# Patient Record
Sex: Male | Born: 1950 | Race: White | Hispanic: No | Marital: Married | Smoking: Smoker, current status unknown
Health system: Southern US, Community
[De-identification: ages and names within clinical notes are randomized; demographics above are authoritative.]

## PROBLEM LIST (undated history)

## (undated) DIAGNOSIS — R0602 Shortness of breath: Secondary | ICD-10-CM

## (undated) DIAGNOSIS — Z720 Tobacco use: Secondary | ICD-10-CM

## (undated) DIAGNOSIS — R55 Syncope and collapse: Secondary | ICD-10-CM

## (undated) DIAGNOSIS — J449 Chronic obstructive pulmonary disease, unspecified: Secondary | ICD-10-CM

## (undated) DIAGNOSIS — I10 Essential (primary) hypertension: Secondary | ICD-10-CM

## (undated) DIAGNOSIS — G5790 Unspecified mononeuropathy of unspecified lower limb: Secondary | ICD-10-CM

## (undated) DIAGNOSIS — E785 Hyperlipidemia, unspecified: Secondary | ICD-10-CM

## (undated) DIAGNOSIS — C801 Malignant (primary) neoplasm, unspecified: Secondary | ICD-10-CM

## (undated) HISTORY — PX: SPINE SURGERY: SHX786

## (undated) HISTORY — DX: Syncope and collapse: R55

## (undated) HISTORY — PX: EYE SURGERY: SHX253

## (undated) HISTORY — PX: NECK SURGERY: SHX720

## (undated) HISTORY — PX: CHOLECYSTECTOMY: SHX55

## (undated) HISTORY — PX: SHOULDER SURGERY: SHX246

## (undated) HISTORY — PX: KNEE CARTILAGE SURGERY: SHX688

---

## 1998-09-27 ENCOUNTER — Observation Stay (HOSPITAL_COMMUNITY): Admission: RE | Admit: 1998-09-27 | Discharge: 1998-09-28 | Payer: Self-pay | Admitting: Specialist

## 1998-09-27 ENCOUNTER — Encounter: Payer: Self-pay | Admitting: Specialist

## 2001-01-18 ENCOUNTER — Encounter (INDEPENDENT_AMBULATORY_CARE_PROVIDER_SITE_OTHER): Payer: Self-pay

## 2001-01-18 ENCOUNTER — Ambulatory Visit (HOSPITAL_COMMUNITY): Admission: RE | Admit: 2001-01-18 | Discharge: 2001-01-18 | Payer: Self-pay | Admitting: Gastroenterology

## 2001-09-20 ENCOUNTER — Emergency Department (HOSPITAL_COMMUNITY): Admission: EM | Admit: 2001-09-20 | Discharge: 2001-09-20 | Payer: Self-pay | Admitting: Emergency Medicine

## 2001-09-29 ENCOUNTER — Inpatient Hospital Stay (HOSPITAL_COMMUNITY): Admission: EM | Admit: 2001-09-29 | Discharge: 2001-10-04 | Payer: Self-pay | Admitting: Emergency Medicine

## 2001-10-03 ENCOUNTER — Encounter: Payer: Self-pay | Admitting: *Deleted

## 2005-07-28 ENCOUNTER — Emergency Department (HOSPITAL_COMMUNITY): Admission: EM | Admit: 2005-07-28 | Discharge: 2005-07-28 | Payer: Self-pay | Admitting: Emergency Medicine

## 2007-01-30 ENCOUNTER — Ambulatory Visit: Payer: Self-pay

## 2008-05-10 ENCOUNTER — Emergency Department (HOSPITAL_COMMUNITY): Admission: EM | Admit: 2008-05-10 | Discharge: 2008-05-10 | Payer: Self-pay | Admitting: Emergency Medicine

## 2008-11-05 ENCOUNTER — Encounter: Payer: Self-pay | Admitting: Pulmonary Disease

## 2009-05-07 ENCOUNTER — Encounter: Payer: Self-pay | Admitting: Pulmonary Disease

## 2009-09-12 DIAGNOSIS — J4489 Other specified chronic obstructive pulmonary disease: Secondary | ICD-10-CM | POA: Insufficient documentation

## 2009-09-12 DIAGNOSIS — F17200 Nicotine dependence, unspecified, uncomplicated: Secondary | ICD-10-CM | POA: Insufficient documentation

## 2009-09-12 DIAGNOSIS — J449 Chronic obstructive pulmonary disease, unspecified: Secondary | ICD-10-CM | POA: Insufficient documentation

## 2009-09-12 DIAGNOSIS — G8929 Other chronic pain: Secondary | ICD-10-CM | POA: Insufficient documentation

## 2009-09-15 ENCOUNTER — Ambulatory Visit: Payer: Self-pay | Admitting: Pulmonary Disease

## 2009-09-15 DIAGNOSIS — J984 Other disorders of lung: Secondary | ICD-10-CM

## 2009-09-15 DIAGNOSIS — L4050 Arthropathic psoriasis, unspecified: Secondary | ICD-10-CM | POA: Insufficient documentation

## 2009-09-18 ENCOUNTER — Ambulatory Visit: Payer: Self-pay | Admitting: Internal Medicine

## 2009-09-25 ENCOUNTER — Ambulatory Visit: Payer: Self-pay | Admitting: Pulmonary Disease

## 2009-10-04 ENCOUNTER — Encounter: Payer: Self-pay | Admitting: Pulmonary Disease

## 2009-10-07 ENCOUNTER — Ambulatory Visit: Payer: Self-pay | Admitting: Pulmonary Disease

## 2010-06-08 ENCOUNTER — Observation Stay (HOSPITAL_COMMUNITY): Admission: EM | Admit: 2010-06-08 | Discharge: 2010-06-09 | Payer: Self-pay | Admitting: Emergency Medicine

## 2010-06-09 ENCOUNTER — Encounter (INDEPENDENT_AMBULATORY_CARE_PROVIDER_SITE_OTHER): Payer: Self-pay | Admitting: Internal Medicine

## 2010-06-09 ENCOUNTER — Ambulatory Visit: Payer: Self-pay | Admitting: Vascular Surgery

## 2010-11-20 ENCOUNTER — Encounter (INDEPENDENT_AMBULATORY_CARE_PROVIDER_SITE_OTHER): Payer: Self-pay | Admitting: Internal Medicine

## 2010-11-20 ENCOUNTER — Ambulatory Visit: Payer: Self-pay | Admitting: Cardiology

## 2010-11-20 ENCOUNTER — Observation Stay (HOSPITAL_COMMUNITY): Admission: EM | Admit: 2010-11-20 | Discharge: 2010-11-20 | Payer: Self-pay | Admitting: Emergency Medicine

## 2011-01-24 LAB — CONVERTED CEMR LAB
ALT: 35 units/L (ref 0–53)
Albumin: 4 g/dL (ref 3.5–5.2)
Angiotensin 1 Converting Enzyme: 48 units/L (ref 9–67)
BUN: 11 mg/dL (ref 6–23)
CRP, High Sensitivity: 9.8 — ABNORMAL HIGH (ref 0.00–5.00)
Calcium: 9.1 mg/dL (ref 8.4–10.5)
Chloride: 104 meq/L (ref 96–112)
Eosinophils Absolute: 0.4 10*3/uL (ref 0.0–0.7)
Eosinophils Relative: 4.7 % (ref 0.0–5.0)
GFR calc non Af Amer: 72.78 mL/min (ref >60)
Glucose, Bld: 123 mg/dL — ABNORMAL HIGH (ref 70–99)
Lymphocytes Relative: 24.5 % (ref 12.0–46.0)
Lymphs Abs: 2.2 10*3/uL (ref 0.7–4.0)
Monocytes Absolute: 0.8 10*3/uL (ref 0.1–1.0)
Neutro Abs: 5.5 10*3/uL (ref 1.4–7.7)
Neutrophils Relative %: 60.8 % (ref 43.0–77.0)
Platelets: 254 10*3/uL (ref 150.0–400.0)
Potassium: 4.2 meq/L (ref 3.5–5.1)
RDW: 11.5 % (ref 11.5–14.6)
Sed Rate: 14 mm/hr (ref 0–22)
Total Protein: 7.4 g/dL (ref 6.0–8.3)

## 2011-03-09 LAB — COMPREHENSIVE METABOLIC PANEL
ALT: 24 U/L (ref 0–53)
AST: 39 U/L — ABNORMAL HIGH (ref 0–37)
Albumin: 3.2 g/dL — ABNORMAL LOW (ref 3.5–5.2)
Alkaline Phosphatase: 64 U/L (ref 39–117)
CO2: 28 mEq/L (ref 19–32)
Calcium: 8.2 mg/dL — ABNORMAL LOW (ref 8.4–10.5)
Calcium: 8.6 mg/dL (ref 8.4–10.5)
Chloride: 108 mEq/L (ref 96–112)
Creatinine, Ser: 1.45 mg/dL (ref 0.4–1.5)
GFR calc Af Amer: >60 mL/min (ref >60)
Glucose, Bld: 100 mg/dL — ABNORMAL HIGH (ref 70–99)
Glucose, Bld: 86 mg/dL (ref 70–99)
Potassium: 3.7 mEq/L (ref 3.5–5.1)
Potassium: 4 mEq/L (ref 3.5–5.1)
Sodium: 139 mEq/L (ref 135–145)
Sodium: 143 mEq/L (ref 135–145)
Total Bilirubin: 0.5 mg/dL (ref 0.3–1.2)
Total Protein: 6.1 g/dL (ref 6.0–8.3)

## 2011-03-09 LAB — URINALYSIS, ROUTINE W REFLEX MICROSCOPIC
Glucose, UA: NEGATIVE mg/dL
Hgb urine dipstick: NEGATIVE
Nitrite: NEGATIVE
Protein, ur: NEGATIVE mg/dL
Specific Gravity, Urine: 1.027 (ref 1.005–1.030)
Urobilinogen, UA: 1 mg/dL (ref 0.0–1.0)

## 2011-03-09 LAB — CBC
HCT: 42.5 % (ref 39.0–52.0)
HCT: 45.7 % (ref 39.0–52.0)
Hemoglobin: 14.4 g/dL (ref 13.0–17.0)
MCH: 31.3 pg (ref 26.0–34.0)
MCH: 32.1 pg (ref 26.0–34.0)
MCV: 91.6 fL (ref 78.0–100.0)
MCV: 92.4 fL (ref 78.0–100.0)
Platelets: 166 10*3/uL (ref 150–400)
Platelets: 214 10*3/uL (ref 150–400)
RBC: 4.6 MIL/uL (ref 4.22–5.81)
RDW: 12.2 % (ref 11.5–15.5)
WBC: 10.2 10*3/uL (ref 4.0–10.5)
WBC: 6.4 10*3/uL (ref 4.0–10.5)

## 2011-03-09 LAB — POCT CARDIAC MARKERS
CKMB, poc: <1 ng/mL — ABNORMAL LOW (ref 1.0–8.0)
Myoglobin, poc: 21.8 ng/mL (ref 12–200)
Myoglobin, poc: 25 ng/mL (ref 12–200)

## 2011-03-09 LAB — DIFFERENTIAL
Basophils Absolute: 0.1 10*3/uL (ref 0.0–0.1)
Eosinophils Relative: 5 % (ref 0–5)
Lymphocytes Relative: 27 % (ref 12–46)

## 2011-03-09 LAB — LIPID PANEL
HDL: 35 mg/dL — ABNORMAL LOW (ref >39)
LDL Cholesterol: 121 mg/dL — ABNORMAL HIGH (ref 0–99)
Triglycerides: 194 mg/dL — ABNORMAL HIGH (ref <150)

## 2011-03-09 LAB — CK TOTAL AND CKMB (NOT AT ARMC)
CK, MB: 1 ng/mL (ref 0.3–4.0)
Total CK: 27 U/L (ref 7–232)
Total CK: 28 U/L (ref 7–232)

## 2011-03-09 LAB — TROPONIN I
Troponin I: 0.02 ng/mL (ref 0.00–0.06)
Troponin I: 0.03 ng/mL (ref 0.00–0.06)

## 2011-03-09 LAB — URINE CULTURE: Colony Count: 3000

## 2011-03-15 LAB — COMPREHENSIVE METABOLIC PANEL
ALT: 24 U/L (ref 0–53)
AST: 21 U/L (ref 0–37)
Alkaline Phosphatase: 67 U/L (ref 39–117)
CO2: 25 mEq/L (ref 19–32)
Calcium: 7.9 mg/dL — ABNORMAL LOW (ref 8.4–10.5)
GFR calc Af Amer: >60 mL/min (ref >60)
GFR calc non Af Amer: >60 mL/min (ref >60)
Glucose, Bld: 180 mg/dL — ABNORMAL HIGH (ref 70–99)
Potassium: 3.6 mEq/L (ref 3.5–5.1)
Sodium: 138 mEq/L (ref 135–145)

## 2011-03-15 LAB — URINALYSIS, ROUTINE W REFLEX MICROSCOPIC
Bilirubin Urine: NEGATIVE
Hgb urine dipstick: NEGATIVE
Nitrite: NEGATIVE
Specific Gravity, Urine: 1.013 (ref 1.005–1.030)
Urobilinogen, UA: 1 mg/dL (ref 0.0–1.0)
pH: 6.5 (ref 5.0–8.0)

## 2011-03-15 LAB — URINE CULTURE: Colony Count: 15000

## 2011-03-15 LAB — HEMOGLOBIN A1C
Hgb A1c MFr Bld: 5.8 % — ABNORMAL HIGH (ref <5.7)
Mean Plasma Glucose: 120 mg/dL — ABNORMAL HIGH (ref <117)

## 2011-03-15 LAB — POCT I-STAT, CHEM 8
Calcium, Ion: 1.07 mmol/L — ABNORMAL LOW (ref 1.12–1.32)
Chloride: 102 mEq/L (ref 96–112)
Creatinine, Ser: 1.5 mg/dL (ref 0.4–1.5)
Glucose, Bld: 134 mg/dL — ABNORMAL HIGH (ref 70–99)
Potassium: 4 mEq/L (ref 3.5–5.1)

## 2011-03-15 LAB — CBC
Hemoglobin: 15.8 g/dL (ref 13.0–17.0)
RBC: 4.89 MIL/uL (ref 4.22–5.81)

## 2011-03-15 LAB — CULTURE, BLOOD (ROUTINE X 2): Culture: NO GROWTH

## 2011-03-15 LAB — CK TOTAL AND CKMB (NOT AT ARMC): CK, MB: 1 ng/mL (ref 0.3–4.0)

## 2011-03-15 LAB — DIFFERENTIAL
Basophils Absolute: 0 10*3/uL (ref 0.0–0.1)
Basophils Relative: 0 % (ref 0–1)
Lymphocytes Relative: 18 % (ref 12–46)
Monocytes Absolute: 0.9 10*3/uL (ref 0.1–1.0)
Monocytes Relative: 8 % (ref 3–12)
Neutro Abs: 7.3 10*3/uL (ref 1.7–7.7)
Neutrophils Relative %: 72 % (ref 43–77)

## 2011-03-15 LAB — POCT CARDIAC MARKERS
CKMB, poc: <1 ng/mL — ABNORMAL LOW (ref 1.0–8.0)
Myoglobin, poc: 75.4 ng/mL (ref 12–200)
Troponin i, poc: <0.05 ng/mL (ref 0.00–0.09)

## 2011-03-15 LAB — CARDIAC PANEL(CRET KIN+CKTOT+MB+TROPI)
CK, MB: 1.1 ng/mL (ref 0.3–4.0)
CK, MB: 1.1 ng/mL (ref 0.3–4.0)
Relative Index: INVALID (ref 0.0–2.5)
Troponin I: 0.01 ng/mL (ref 0.00–0.06)

## 2011-03-15 LAB — CORTISOL: Cortisol, Plasma: 1.8 ug/dL

## 2011-03-15 LAB — TYPE AND SCREEN: Antibody Screen: NEGATIVE

## 2011-03-15 LAB — MRSA PCR SCREENING: MRSA by PCR: NEGATIVE

## 2011-03-15 LAB — PROLACTIN: Prolactin: 3.4 ng/mL (ref 2.1–17.1)

## 2011-03-15 LAB — LIPID PANEL
HDL: 23 mg/dL — ABNORMAL LOW (ref >39)
LDL Cholesterol: 59 mg/dL (ref 0–99)
Triglycerides: 334 mg/dL — ABNORMAL HIGH (ref <150)
VLDL: 67 mg/dL — ABNORMAL HIGH (ref 0–40)

## 2011-03-15 LAB — TROPONIN I: Troponin I: 0.01 ng/mL (ref 0.00–0.06)

## 2011-03-15 LAB — D-DIMER, QUANTITATIVE: D-Dimer, Quant: 0.26 ug/mL-FEU (ref 0.00–0.48)

## 2011-05-14 NOTE — Consult Note (Signed)
[place]. [facility]  Patient:    Nicholas Soto, Nicholas Soto Visit Number: [id number] MRN: [id number]          Service Type: MED Location: [phone] Attending Physician:  Nelta Numbers Proc. Date: 10/03/01 Admit Date:  09/29/2001                            Consultation Report  CHIEF COMPLAINT:  Recurrent episodes of lightheadedness and syncope x 1.  HISTORY OF PRESENT ILLNESS:  Patient is a 60 year old white man with a 30-month history of substernal nonradiating episodes of chest pain that have been associated with slight dyspnea.  These episodes have lasted seconds to minutes and then spontaneously resolved, are not exacerbated by activity.  At the same time the patient has had a recent history of light-headed spells that have increased in severity over the past two weeks but have been ongoing for an indeterminate amount of time.  These episodes are not associated with his chest pain; however, he is hospitalized for his chest pain.  His light-headed spells are associated with bilateral lower extremity weakness and he describes these spells as just a feeling of dizziness that can last anywhere from seconds to minutes.  They are not position dependent and are associated with a sense of vertigo and are not associated with nausea or vomiting - he just feels like he is "unable to think straight."  Approximately two weeks ago, the patient had an episode at night when he was bent over trying to unplug an appliance in his bedroom.  When he stood back up he felt very unsteady and took several backward steps and felt that he would have fallen if he had not backed into his dresser.  The patient had a few beers that night and really did not think anything more about that episode and went to sleep.  And then approximately six days prior to admission the patient had an episode while he was playing in the floor with his granddaughter in which, when he  saturation up he had a syncopal episode, became unresponsive for a few minutes, and slowly became more responsive, was able to answer his wife; when she asked him if something was wrong, he said "yes, something is wrong."  He does not remember that episode until EMS arrived, which was approximately 10 to 15 minutes after that episode began.  This has been his only syncopal episode in his life.  He came to the ER after that and was discharged after his EKG was normal, and then two days after that - after eating a heavy [place] meal - had some severe chest pain along with lightheadedness and came to the hospital and has been admitted since then, which has been five days.  The patient has had an extensive cardiac workup which to date is within normal limits, and we were asked to see the patient for neurology consult.  PAST MEDICAL HISTORY:  Significant for hyperlipidemia and peptic ulcer disease/GERD.  MEDICATIONS:  His medication prior to admission was  Prilosec 20 p.o. q.d. Since then, he has been started on Zocor.  ALLERGIES:  DEMEROL.  PAST SURGICAL HISTORY: 1. Lower lumbar surgery. 2. Bilateral shoulder surgeries. 3. Cervical fusion between C4 and C7 in approximately 2000.  SOCIAL HISTORY:  The patient lives in [place] with his wife and two sons, who are 52 and 63, and a six-year-old adopted daughter.  He has a positive tobacco  history of one-and-a-half packs per day x 40 years.  He is currently attempting to stop smoking and has been started on Wellbutrin.  He has a positive alcohol history of two to three beers per day.  Denies any current use of IV drugs or marijuana but does have a remote history of opioid and hash use but says that he has completely stopped that.  He works in a [employer], [employer].  FAMILY HISTORY:  Significant for his father had epilepsy and died of a heart attack, and his mother is still alive but has severe diabetes, has had an amputation, and has  lost function of her kidneys.  She also has a history of colon cancer and kidney cancer.  He has a brother with multiple sclerosis and one brother who is deceased from complications of diabetes.  LABORATORY DATA:  Significant for cholesterol 214, triglycerides 488, HDL cholesterol 41.  PTT 38.  Sodium 140, potassium 4.1, chloride 100, bicarb 30, BUN 12, creatinine 1.2, glucose 87, calcium 9.2.  REVIEW OF SYSTEMS:  Negative except as noted above in the HPI.  He does describe some muscle spasms that come and go in his arms, back, and legs.  He is able to look and see and can even induce these himself.  CARDIOVASCULAR: Besides the chest pain he has been having, he denies any palpitations. ENDOCRINE:  He denies any weight loss and does not have heat or cold intolerance.  PULMONARY:  He denies any shortness of breath or wheezing.  GI: He denies any hematemesis or hematochezia.  No bright red blood per rectum. JOINT:  He has no swelling of joints, no pain, and no lower extremity edema.  PHYSICAL EXAMINATION:  VITAL SIGNS:  On our exam, temperature 96.7, pulse 55, respirations 20, blood pressure 110/60.  Saturation 96% on room air.  NEUROLOGIC:  Mental status:  He was alert and oriented x 4.  He knew that it was 2002, that he was at [facility] in [place], that Danae Orleans was Economist, that Remer Macho was [employer].  He did not have any agnosia or apraxia, was able to correctly identify a pen and a watch.  During our exam, he had two light-headed episodes during which he would stop answering and really focus on his dizziness.  He was able to answer questions, however, when prompted during these episodes.  His cranial nerves 2-12 were intact.  He had no facial droop. His extraocular movements were intact.  His pupils were equal, round, and reactive to light and accommodation.  He did not have any nystagmus, no visual field defects.  His tongue was midline upon protrusion.  His palate  raised symmetrically and his shoulder shrug was intact.  Motor exam:  He had normal bulk without atrophy.  His tone was good without fasciculations or spasm.  His  strength was 5/5 throughout with biceps, triceps, flexor carpi, radialis, extensor carpi, ulnaris, hip flexors, gastrocnemius, anterior tibialis.  His sensory exam was intact to fine touch throughout.  His proprioception was intact and his graphesthesia was intact.  Coordination:  Finger-to-nose, he had a little bit of post pointing on exam without his glasses.  He did not have any pronator drift.  Gait:  He had normal stance and was able to tandem walk without problem.  His Romberg was negative.  Reflex exam:  2+ throughout, downgoing Babinskis bilaterally.  His right biceps reflex was absent.  HEENT:  He did not have any carotid bruits, no thyromegaly, and his trachea  was midline.  CARDIOVASCULAR:  He had regular rate and rhythm without murmur, gallop, or rub.  PULMONARY:  He was clear to auscultation bilaterally.  ABDOMEN:  Soft, nontender, positive bowel sounds.  EXTREMITIES:  No edema and his strength and sensation exam was as above.  ASSESSMENT AND PLAN:  This is a 60 year old white man with recurrent lightheadedness and one syncopal episode unrelated to his chest pain, position, or activity.  Differential diagnosis for his episodes is very broad, including most likely, vertebrobasilar insufficiency or VBI - which could be secondary to his cervical fusion surgery with ______ impedance of his vertebral arteries.  This could also be absence seizures or MS, and so we would recommend and plan to obtain an MRI/MRA and an EEG to evaluate this patient in the light of his normal cardiac workup.  We plan to get the EEG during this admission and attempt to get the MRI/MRA.  However, the patient is eager for discharge and may be discharged prior to his MRI/MRA, which he will then receive as an outpatient. Attending  Physician:  Nelta Numbers DD:  10/03/01 TD:  10/03/01 Job: [phone] [id number]

## 2011-05-14 NOTE — Discharge Summary (Signed)
[place]. [facility]  Patient:    Nicholas Soto, Nicholas Soto Visit Number: [id number] MRN: [id number]          Service Type: MED Location: [phone] Attending Physician:  Nelta Numbers Dictated by:   Chinita Pester, N.P. Admit Date:  09/29/2001 Discharge Date: 10/04/2001                             Discharge Summary  ADDENDUM TO DISCHARGE INSTRUCTIONS:  The patient was advised that he was not to drive for the next three months. Dictated by:   Chinita Pester, N.P. Attending Physician:  Nelta Numbers DD:  10/04/01 TD:  10/04/01 Job: [id number]

## 2011-05-14 NOTE — Cardiovascular Report (Signed)
[place]. [facility]  Patient:    Nicholas, Soto Visit Number: [id number] MRN: [id number]          Service Type: MED Location: [phone] Attending Physician:  Nelta Numbers Dictated by:   Veneda Melter, M.D. Proc. Date: 10/02/01 Admit Date:  09/29/2001   CC:         Nathen May, M.D., [facility]  Thayer Ohm ___________, M.D.   Cardiac Catheterization  PROCEDURES PERFORMED: 1. Left heart catheterization. 2. Left ventriculogram. 3. Left coronary angiography.  DIAGNOSES: 1. Mild coronary artery disease by angiogram. 2. Normal left ventricular systolic function.  INDICATIONS:  Mr. Swigart is a 60 year old white male who presents with chest discomfort and syncope.  The patient was admitted to the hospital and ruled out for acute myocardial infarction.  He presents now for further cardiac assessment.  TECHNIQUE:  Informed consent was obtained, the patient was brought to the cardiac catheterization lab.  A 6 French sheath was placed in the right femoral artery.  Left heart catheterization, selective angiography were then performed in the usual fashion using preformed 6 French Judkins catheters.  At the termination of the case, the catheters and sheath were removed.  Manual pressure was applied until adequate hemostasis was achieved.  The patient tolerated the procedure well and was transferred to the floor in stable condition.  FINDINGS:  Findings are as follows:  Left main trunk:  The left main trunk is medium caliber vessel and is angiographically normal.  Left anterior descending:  The left anterior descending is a medium caliber vessel which provides two small diagonal branches.  There is mild diffuse disease of 30% in the mid LAD.  Left circumflex artery:  The left circumflex artery is a medium caliber vessel which consists of a _______ first marginal branch, proximal large second marginal branch in the mid section.   There is mild disease of 30% in the AV circumflex.  Right coronary artery:  The right coronary artery is dominant, medium caliber vessel that supplies the posterior descending artery and several small posterior ventricular branches terminal segment.  There is moderate disease of 30-40% in the mid right coronary artery.  LEFT VENTRICULOGRAM:  Normal end-systolic and end-diastolic dimensions. Overall left ventricular function is well-preserved with an ejection fraction of greater than 55%.  There is no mitral regurgitation.  The LV pressure is 110/10, aortic 110/65.  The left ventricular end-diastolic pressure is 16.  ASSESSMENT AND PLAN:  Mr. Shipes is a 60 year old gentleman with mild coronary artery disease and normal LV function.  Continue medical therapy and risk factor modification will be pursued.  Other causes of syncope will be investigated. Dictated by:   Veneda Melter, M.D. Attending Physician:  Nelta Numbers DD:  10/02/01 TD:  10/02/01 Job: [id number]

## 2011-05-14 NOTE — Procedures (Signed)
[place]. [facility]  Patient:    Nicholas Soto, Nicholas Soto Visit Number: [id number] MRN: [id number]          Service Type: Attending:  Florencia Reasons, M.D. Proc. Date: 01/18/01   CC:         Aura Dials, M.D.   Procedure Report  PROCEDURE:  Upper endoscopy with biopsies.  ENDOSCOPIST:  Florencia Reasons, M.D.  INDICATIONS:  [age]-year-old gentleman with past history of distal esophagitis, maintained on chronic PPI therapy, but with recent increasingly frequent symptoms of episodic fist like pain in the epigastric area going through to his back.  He also has some degree of more classic reflux symptoms and recently had an episode of possible hematemesis where he vomited up a pink fluid.  That occurred a couple of days ago.  Finally, there is a past history of H. pylori infection, status post treatment five years ago.  INFORMED CONSENT:  The nature, purpose and risk of the procedure have been previously discussed with the patient and were familiar to him from prior examination, and he provided written consent.  SEDATION:   Fentanyl 50 mcg and Versed 7 mg IV without arrhythmias or desaturation.  DESCRIPTION OF PROCEDURE:  The Olympus adult video endoscope was passed under direct vision.  The larynx and vocal cords looked entirely normal.  The esophagus was quite easily entered and was normal in terms of its mucosal appearance without evidence of reflux, esophagitis, Barretts esophagus, varices, infection or neoplasia.  There was a widely patent esophageal ring above a 1-2 cm hiatal hernia.  The stomach was entered.  It contained no significant residual, no bile, no blood or coffee ground material and had normal mucosa without evidence of gastritis, erosions, ulcers, polyps or masses including retroflexed view of the proximal stomach which showed essentially no hiatal hernia.  The pylorus, duodenal bulb second duodenum all looked normal.  Antral  biopsies were obtained to check for Helicobacter pylori prior to removal of the scope.  The patient tolerated the procedure well, and there were no apparent complications.  IMPRESSION: 1. Essentially normal endoscopy. 2. Small hiatal hernia with esophageal ring. 3. No source of recent possible hematemesis or abdominal pain    identified.  PLAN: 1. Await pathology on biopsies. 2. Trial of Nexium 40 mg b.i.d. Attending:  Florencia Reasons, M.D. DD:  01/18/01 TD:  01/18/01 Job: [id number]

## 2011-05-14 NOTE — Discharge Summary (Signed)
[place]. [facility]  Patient:    Nicholas Soto, Nicholas Soto Visit Number: [id number] MRN: [id number]          Service Type: MED Location: [phone] Attending Physician:  Nelta Numbers Dictated by:   Chinita Pester, N.P. Admit Date:  09/29/2001 Discharge Date: 10/04/2001   CC:         Gerrit Friends. Dietrich Pates, M.D. [facility] [place]  Aura Dials, M.D., Urgent Care, [place], [place]   Discharge Summary  PRIMARY DIAGNOSIS:  Chest pain and lightheadedness.  HISTORY OF PRESENT ILLNESS:  This is 60 year old gentleman with no known coronary disease, cardiac risk factors notable for cigarettes for many years, hypertension, family history, hyperlipidemia who has had a 34-month history of intermittent substernal chest discomfort that is nonradiating, associated with dyspnea but is unassociated with exertion.  These episodes last typically seconds to minutes and then abate spontaneously.  He underwent a stress test some months ago that was ready by Dr. Tresa Endo as normal. This was accomplished at Dr. Ellin Goodie office. The patient also has a 77-month history of lightheaded spells. They occur in a position, lasting 15 seconds to two minutes, says he is a good estimator of time.  These can occur sitting or standing, associated with slight vertigo sensation. He has had one episode during hospitalization, episode 10 days ago occurred after playing on the ground with his granddaughter, became nonresponsive, but at this point he did not lose postural tone.  He was sitting up and sat back against his wifes loveseat and became unresponsive and lost body tone.  Apparently while he was still amnesic a minute or two later, he was able to be stood up with the help of his family and was ambulated to a sofa.  Upon arrival of EMS 10 minutes later, he was noted to be pale and clammy.  His wife describes this well.  HOSPITAL COURSE:  The patient was admitted, underwent an EP consult as  well as a neurology consult.  He underwent a cardiac catheterization.  As per Nathen May, M.D., [facility], lightheadedness appeared to be nonarrhythmia based on observation, raising the possibility of either vasodepression or noncardiac causes.  The patient underwent a cardiac catheterization.  Cardiac enzymes were negative. He was started on Wellbutrin for smoke cessation as well as Lipitor for increased LFTs.  Cholesterol of 214, triglycerides 488, HDL 41, and LDL was not calculated.  Cardiac catheterization revealed mild LAD disease with a normal LV function, EF of 55%.  The patient underwent a neurology consult.  He had an EEG which was read as normal, MRI of the brain which was read as normal and MRA which was also read as normal. He was discharged to home to follow up with his family physician, Aura Dials, M.D.  DISCHARGE MEDICATIONS: 1. Prevacid daily. 2. Wellbutrin 150 mg twice a day. 3. Lipitor 20 nightly. 4. Enteric coated aspirin 325 daily. 5. Antivert 25 twice a day.  ACTIVITY:  He was instructed not to do any heavy lifting or strenuous activity for the next two days and no driving for 24 hours.  DIET:  Low fat, low cholesterol, low salt diet.  DISCHARGE INSTRUCTIONS:  He was to call if he developed any drainage or lump. He was to have lipids, LFTs within six weeks and follow-up with Candy Sledge, M.D., and Dr. Everlene Other. Dictated by:   Chinita Pester, N.P. Attending Physician:  Nelta Numbers DD:  10/04/01 TD:  10/04/01 Job: [phone] [id number] [id number]

## 2011-05-14 NOTE — Consult Note (Signed)
[place]. [facility]  Patient:    Nicholas, Soto Visit Number: [id number] MRN: [id number]          Service Type: MED Location: [phone] Attending Physician:  Nelta Numbers Proc. Date: 10/02/01 Admit Date:  09/29/2001   CC:         Nicholas Soto, M.D.   Consultation Report  I had the privilege of seeing [name] for electrophysiological consultation at the request of Dr. Dietrich Pates and Dr. Everlene Other for recurrent episodes of lightheadedness and syncope.  [name] is a 60 year old gentleman with no known coronary artery disease, but with cardiac risk factors notable for cigarettes x many years, hypertension, family history, and hyperlipidemia, who has a 23-month history of intermittent substernal chest discomfort that is nonradiating associated with some dyspnea, but is unassociated with exertion.  These episodes last typically seconds to minutes and then abates spontaneously.  He underwent a stress test some months ago that was read by Dr. Tresa Endo and was normal. This was accomplished at Dr. Ellin Goodie office.  The patient also has a 26-month history of lightheaded spells.  These occur in any position lasting 15 seconds to 2 minutes; he says he is a good estimater of time.  These can occur sitting or standing.  They are associated with some slight vertiginous sensation.  He had one today while I was talking to him in the hospital (see below).  He had one episode of syncope 10 days ago.  This occurred after playing on the ground with his granddaughter.  He then became nonresponsive, but at this point did not lose postural tone.  He was sitting up and sat back against his wifes love seat and then became unresponsive and lost body tone.  Apparently while he was still amnestic a minute or two later he was able to be stood up with the help of his family and was able to ambulate to the sofa.  Upon arrival of EMS some 10 minutes  later, he was noted to be pale and clammy.  His wife describes this as well.  Vital signs from that ER transport are not available.  The patient denies other syncope.  Family history for syncope is negative. He denies palpitations.  PAST MEDICAL HISTORY:  Notable primarily as above.  PAST SURGICAL HISTORY:  Notable for lower lumbar surgery, bilateral shoulder surgery, and cervical fusion.  SOCIAL HISTORY:  He lives with his wife.  He drinks alcohol, a couple of beers per day.  He smokes 1-1/2 packs of cigarettes per day.  PAST MEDICAL HISTORY:  Notable for ulcer disease for which he is followed by Dr. Matthias Hughs and takes Prilosec.  REVIEW OF SYSTEMS:  As outlined on the intake sheet.  It is reviewed and has not changed at this time.  PHYSICAL EXAMINATION:  GENERAL:  He is a middle-aged Caucasian male in no acute distress.  VITAL SIGNS:  Blood pressure 122/70, pulse 67.  HEENT:  No xanthoma.  The neck veins were flat and the carotids were brisk and full bilaterally without bruits.  BACK:  Without kiphosis or scoliosis.  LUNGS:  Clear.  HEART:  Regular without murmurs and S4 was appreciated by Dr. Dietrich Pates, but not by me.  ABDOMEN:  Soft with active bowel sounds without midline pulsation or hepatomegaly.  EXTREMITIES:  Femoral pulses were 2+ and distal pulses were intact.  There was no clubbing, cyanosis, or edema.  NEUROLOGICAL:  Grossly normal.  Electrocardiogram dated September 30, 2001,  demonstrates sinus rhythm at 66 with intervals of 0.19, 0.09, 0.37 with an axis of 10 degrees.  The electrocardiogram was otherwise notable for a smaller prime in lead V1 and mild ST segment coving in that same lead.  An electrocardiogram on October 6, demonstrates similar intervals in lead V1.  The T wave has become inverted, in V2 it is still upright.  There was also minor ST segment elevation observed in leads V2 that seems to be discordant from the changes in the T wave  axis.  IMPRESSION: 1. Recurrent chest pain - typical and atypical features with multiple cardiac    risk factors including:    a. Hypertension.    b. Cigarettes.    c. Family history.    d. Hyperlipidemia. 2. Recurrent dizziness with normal telemetry. 3. Syncope, question mechanism. 4. Abnormal electrocardiogram with R prime in lead V1.  DISCUSSION:  Nicholas Soto has chest pain with multiple cardiac risk factors, negative Cardiolite, and is scheduled to undergo cardiac catheterization for elucidation of his coronary anatomy.  Given his variable ECG changes in the anterior precordium, this is clearly the right next step.  As to his lightheadedness, this appears to be nonarrhythmia based on the observation this morning, raising the possibility of either vasodepression and/or noncardiac causes.  We will need to see what the catheterization shows and whether he is seemingly at risk for Bezold-Jarisch reflex.  His syncope will need to be understood also in the context of what his structural heart disease turns out to be.  At this juncture, the differential diagnosis remains quite broad.  RECOMMENDATION: 1. Proceed with cardiac catheterization and ultrasound as scheduled. 2. Further evaluation of syncope will dependent on catheterization results. 3. In the event that nothing else is clarified, will want to exclude Brugodda    syndrome as the cause of his syncope given the R prime in the minor ST    segment coving in lead V1 and V2.  Thank you for the consultation. Attending Physician:  Nelta Numbers DD:  10/02/01 TD:  10/02/01 Job: [id number]

## 2011-08-13 ENCOUNTER — Emergency Department (HOSPITAL_COMMUNITY): Payer: 59

## 2011-08-13 ENCOUNTER — Emergency Department (HOSPITAL_COMMUNITY)
Admission: EM | Admit: 2011-08-13 | Discharge: 2011-08-14 | Disposition: A | Discharge status: Home / Self Care | Payer: 59 | Attending: Emergency Medicine | Admitting: Emergency Medicine

## 2011-08-13 DIAGNOSIS — S63509A Unspecified sprain of unspecified wrist, initial encounter: Secondary | ICD-10-CM | POA: Insufficient documentation

## 2011-08-13 DIAGNOSIS — W19XXXA Unspecified fall, initial encounter: Secondary | ICD-10-CM | POA: Insufficient documentation

## 2011-08-13 DIAGNOSIS — Z79899 Other long term (current) drug therapy: Secondary | ICD-10-CM | POA: Insufficient documentation

## 2011-08-13 DIAGNOSIS — M25559 Pain in unspecified hip: Secondary | ICD-10-CM | POA: Insufficient documentation

## 2011-08-13 DIAGNOSIS — L4050 Arthropathic psoriasis, unspecified: Secondary | ICD-10-CM | POA: Insufficient documentation

## 2011-08-13 DIAGNOSIS — Y92009 Unspecified place in unspecified non-institutional (private) residence as the place of occurrence of the external cause: Secondary | ICD-10-CM | POA: Insufficient documentation

## 2011-09-22 LAB — POCT I-STAT, CHEM 8
BUN: 5 — ABNORMAL LOW
Chloride: 99
Creatinine, Ser: 1
Glucose, Bld: 115 — ABNORMAL HIGH
Hemoglobin: 12.2 — ABNORMAL LOW
Potassium: 2.8 — ABNORMAL LOW
Sodium: 139

## 2011-09-22 LAB — DIFFERENTIAL
Eosinophils Absolute: 0.4
Lymphocytes Relative: 15
Lymphs Abs: 1.2
Monocytes Relative: 9
Neutrophils Relative %: 69

## 2011-09-22 LAB — CBC
MCV: 88
Platelets: 539 — ABNORMAL HIGH
RBC: 4.09 — ABNORMAL LOW
WBC: 7.7

## 2011-11-09 ENCOUNTER — Other Ambulatory Visit: Payer: Self-pay | Admitting: Family Medicine

## 2011-11-09 DIAGNOSIS — R911 Solitary pulmonary nodule: Secondary | ICD-10-CM

## 2011-11-10 ENCOUNTER — Ambulatory Visit
Admission: RE | Admit: 2011-11-10 | Discharge: 2011-11-10 | Disposition: A | Discharge status: Home / Self Care | Payer: 59 | Source: Ambulatory Visit | Attending: Family Medicine | Admitting: Family Medicine

## 2011-11-10 DIAGNOSIS — R911 Solitary pulmonary nodule: Secondary | ICD-10-CM

## 2011-11-10 MED ORDER — IOHEXOL 300 MG/ML  SOLN
75.0000 mL | Freq: Once | INTRAMUSCULAR | Status: AC | PRN
  Administered 2011-11-10: 75 mL via INTRAVENOUS

## 2012-01-23 ENCOUNTER — Encounter (HOSPITAL_COMMUNITY): Payer: Self-pay | Admitting: Emergency Medicine

## 2012-01-23 ENCOUNTER — Observation Stay (HOSPITAL_COMMUNITY)
Admission: EM | Admit: 2012-01-23 | Discharge: 2012-01-24 | Disposition: A | Discharge status: Home / Self Care | Payer: 59 | Source: Ambulatory Visit | Attending: Internal Medicine | Admitting: Internal Medicine

## 2012-01-23 ENCOUNTER — Emergency Department (HOSPITAL_COMMUNITY): Payer: 59

## 2012-01-23 DIAGNOSIS — F17200 Nicotine dependence, unspecified, uncomplicated: Secondary | ICD-10-CM | POA: Insufficient documentation

## 2012-01-23 DIAGNOSIS — M109 Gout, unspecified: Secondary | ICD-10-CM | POA: Insufficient documentation

## 2012-01-23 DIAGNOSIS — Z85840 Personal history of malignant neoplasm of eye: Secondary | ICD-10-CM | POA: Insufficient documentation

## 2012-01-23 DIAGNOSIS — R7309 Other abnormal glucose: Secondary | ICD-10-CM | POA: Insufficient documentation

## 2012-01-23 DIAGNOSIS — J4489 Other specified chronic obstructive pulmonary disease: Secondary | ICD-10-CM | POA: Diagnosis present

## 2012-01-23 DIAGNOSIS — L4050 Arthropathic psoriasis, unspecified: Secondary | ICD-10-CM | POA: Insufficient documentation

## 2012-01-23 DIAGNOSIS — J984 Other disorders of lung: Secondary | ICD-10-CM

## 2012-01-23 DIAGNOSIS — IMO0002 Reserved for concepts with insufficient information to code with codable children: Secondary | ICD-10-CM | POA: Insufficient documentation

## 2012-01-23 DIAGNOSIS — J441 Chronic obstructive pulmonary disease with (acute) exacerbation: Secondary | ICD-10-CM | POA: Insufficient documentation

## 2012-01-23 DIAGNOSIS — R079 Chest pain, unspecified: Principal | ICD-10-CM | POA: Insufficient documentation

## 2012-01-23 DIAGNOSIS — J449 Chronic obstructive pulmonary disease, unspecified: Secondary | ICD-10-CM | POA: Diagnosis present

## 2012-01-23 DIAGNOSIS — R0602 Shortness of breath: Secondary | ICD-10-CM | POA: Insufficient documentation

## 2012-01-23 DIAGNOSIS — G8929 Other chronic pain: Secondary | ICD-10-CM

## 2012-01-23 DIAGNOSIS — E785 Hyperlipidemia, unspecified: Secondary | ICD-10-CM | POA: Insufficient documentation

## 2012-01-23 DIAGNOSIS — I10 Essential (primary) hypertension: Secondary | ICD-10-CM | POA: Insufficient documentation

## 2012-01-23 HISTORY — DX: Chronic obstructive pulmonary disease, unspecified: J44.9

## 2012-01-23 HISTORY — DX: Hyperlipidemia, unspecified: E78.5

## 2012-01-23 HISTORY — DX: Essential (primary) hypertension: I10

## 2012-01-23 HISTORY — DX: Malignant (primary) neoplasm, unspecified: C80.1

## 2012-01-23 HISTORY — DX: Tobacco use: Z72.0

## 2012-01-23 HISTORY — DX: Unspecified mononeuropathy of unspecified lower limb: G57.90

## 2012-01-23 LAB — POCT I-STAT, CHEM 8
BUN: 7 mg/dL (ref 6–23)
Chloride: 104 mEq/L (ref 96–112)
Creatinine, Ser: 0.8 mg/dL (ref 0.50–1.35)
Sodium: 142 mEq/L (ref 135–145)

## 2012-01-23 LAB — CBC
HCT: 38 % — ABNORMAL LOW (ref 39.0–52.0)
Hemoglobin: 13.2 g/dL (ref 13.0–17.0)
MCV: 91.1 fL (ref 78.0–100.0)
RBC: 4.17 MIL/uL — ABNORMAL LOW (ref 4.22–5.81)
WBC: 10.5 10*3/uL (ref 4.0–10.5)

## 2012-01-23 LAB — POCT I-STAT TROPONIN I: Troponin i, poc: 0 ng/mL (ref 0.00–0.08)

## 2012-01-23 LAB — DIFFERENTIAL
Basophils Absolute: 0.1 10*3/uL (ref 0.0–0.1)
Eosinophils Relative: 3 % (ref 0–5)
Lymphocytes Relative: 27 % (ref 12–46)
Lymphs Abs: 2.9 10*3/uL (ref 0.7–4.0)
Monocytes Absolute: 0.8 10*3/uL (ref 0.1–1.0)
Neutro Abs: 6.4 10*3/uL (ref 1.7–7.7)

## 2012-01-23 LAB — BASIC METABOLIC PANEL
CO2: 29 mEq/L (ref 19–32)
Calcium: 8.8 mg/dL (ref 8.4–10.5)
Chloride: 103 mEq/L (ref 96–112)
Creatinine, Ser: 0.88 mg/dL (ref 0.50–1.35)
Glucose, Bld: 106 mg/dL — ABNORMAL HIGH (ref 70–99)

## 2012-01-23 MED ORDER — MORPHINE SULFATE 4 MG/ML IJ SOLN
4.0000 mg | Freq: Once | INTRAMUSCULAR | Status: AC
  Administered 2012-01-23: 4 mg via INTRAVENOUS
  Filled 2012-01-23: qty 1

## 2012-01-23 MED ORDER — PREDNISONE 20 MG PO TABS
60.0000 mg | ORAL_TABLET | Freq: Once | ORAL | Status: AC
  Administered 2012-01-23: 60 mg via ORAL
  Filled 2012-01-23: qty 3

## 2012-01-23 MED ORDER — ALBUTEROL SULFATE (5 MG/ML) 0.5% IN NEBU
5.0000 mg | INHALATION_SOLUTION | Freq: Once | RESPIRATORY_TRACT | Status: AC
  Administered 2012-01-23: 5 mg via RESPIRATORY_TRACT
  Filled 2012-01-23: qty 1

## 2012-01-23 MED ORDER — SODIUM CHLORIDE 0.9 % IV BOLUS (SEPSIS)
1000.0000 mL | Freq: Once | INTRAVENOUS | Status: AC
  Administered 2012-01-23: 1000 mL via INTRAVENOUS

## 2012-01-23 NOTE — ED Notes (Signed)
Report given and care endorsed to Kassie, RN.  

## 2012-01-23 NOTE — ED Notes (Signed)
Pt c/o CP substernally that radiates into his right arm. No dyspnea, denies N/V.

## 2012-01-23 NOTE — ED Notes (Signed)
Pt rolling around in the bed clutching his chest and mildly hyperventilating with c/o CP at 10/10.  Awaiting orders for pain medication.  Pt denies nausea at this time.

## 2012-01-23 NOTE — ED Notes (Signed)
Alert, NAD, calm, interactive, skin W&D, resps e/u, speaking in clear complete sentences, rates pain 4/10.

## 2012-01-23 NOTE — ED Provider Notes (Signed)
History     CSN: [id number]  Arrival date & time 01/23/12  2131   First MD Initiated Contact with Patient 01/23/12 2149      Chief Complaint  Patient presents with  . Chest Pain    Substernal radiation to right arm    (Consider location/radiation/quality/duration/timing/severity/associated sxs/prior treatment) Patient is a 61 y.o. male presenting with chest pain. The history is provided by the patient. No language interpreter was used.  Chest Pain The chest pain began 3 - 5 hours ago. Chest pain occurs constantly. The chest pain is worsening. The pain is associated with breathing. The severity of the pain is moderate. The quality of the pain is described as aching and sharp (substernal). The pain radiates to the right arm. Primary symptoms include fatigue, shortness of breath, cough and nausea. Pertinent negatives for primary symptoms include no fever, no syncope, no wheezing, no palpitations, no abdominal pain, no vomiting and no dizziness.  The fatigue began today. The fatigue has been worsening since its onset.  The shortness of breath began today. The shortness of breath is moderate.  Pertinent negatives for associated symptoms include no diaphoresis, no lower extremity edema, no near-syncope, no numbness, no orthopnea and no weakness. He tried nitroglycerin for the symptoms.     Past Medical History  Diagnosis Date  . Cardiac abnormality     previous CP with unknown dx  . Cancer     of the eye  . Hypertension   . COPD (chronic obstructive pulmonary disease)     No past surgical history on file.  No family history on file.  History  Substance Use Topics  . Smoking status: Smoker, Current Status Unknown -- 1.0 packs/day for 20 years    Types: Cigarettes  . Smokeless tobacco: Not on file  . Alcohol Use: No      Review of Systems  Constitutional: Positive for fatigue. Negative for fever, diaphoresis, activity change and appetite change.  HENT: Negative for  congestion, sore throat, rhinorrhea, neck pain and neck stiffness.   Respiratory: Positive for cough and shortness of breath. Negative for chest tightness and wheezing.   Cardiovascular: Positive for chest pain. Negative for palpitations, orthopnea, syncope and near-syncope.  Gastrointestinal: Positive for nausea. Negative for vomiting and abdominal pain.  Genitourinary: Negative for dysuria, urgency, frequency and flank pain.  Neurological: Negative for dizziness, weakness, light-headedness, numbness and headaches.  All other systems reviewed and are negative.    Allergies  Meperidine hcl  Home Medications   Current Outpatient Rx  Name Route Sig Dispense Refill  . COLCHICINE 0.6 MG PO TABS Oral Take 0.6 mg by mouth daily.    . FEBUXOSTAT 40 MG PO TABS Oral Take 80 mg by mouth daily.    . FENTANYL 75 MCG/HR TD PT72 Transdermal Place 1 patch onto the skin every 3 (three) days.    Marland Kitchen FLUOXETINE HCL 20 MG PO CAPS Oral Take 20 mg by mouth daily.    Marland Kitchen LORAZEPAM 1 MG PO TABS Oral Take 1 mg by mouth every 8 (eight) hours.    Marland Kitchen MONTELUKAST SODIUM 10 MG PO TABS Oral Take 10 mg by mouth at bedtime.    . OLOPATADINE HCL 0.1 % OP SOLN Both Eyes Place 1 drop into both eyes daily as needed. For allergy    . OXYCODONE HCL ER 40 MG PO TB12 Oral Take 40 mg by mouth every 12 (twelve) hours. For pain    . PANTOPRAZOLE SODIUM 40 MG PO TBEC Oral  Take 40 mg by mouth daily.    Marland Kitchen PREDNISONE 10 MG PO TABS Oral Take 10 mg by mouth daily.      BP 123/69  Pulse 83  Temp(Src) 97.9 F (36.6 C) (Oral)  Resp 24  SpO2 94%  Physical Exam  Nursing note and vitals reviewed. Constitutional: He is oriented to person, place, and time. He appears well-developed and well-nourished.       Appears uncomfortable  HENT:  Head: Normocephalic and atraumatic.  Mouth/Throat: Oropharynx is clear and moist.  Eyes: Conjunctivae and EOM are normal. Pupils are equal, round, and reactive to light.  Neck: Normal range of motion.  Neck supple.  Cardiovascular: Normal rate, regular rhythm, normal heart sounds and intact distal pulses.  Exam reveals no gallop and no friction rub.   No murmur heard. Pulmonary/Chest: Effort normal. No respiratory distress. He has wheezes. He exhibits no tenderness.  Abdominal: Soft. Bowel sounds are normal. There is no tenderness.  Musculoskeletal: Normal range of motion. He exhibits no edema and no tenderness.  Neurological: He is alert and oriented to person, place, and time.  Skin: Skin is warm and dry.    ED Course  Procedures (including critical care time)   Date: 01/23/2012  Rate: 78  Rhythm: normal sinus rhythm  QRS Axis: normal  Intervals: normal  ST/T Wave abnormalities: normal  Conduction Disutrbances:none  Narrative Interpretation:   Old EKG Reviewed: unchanged  Labs Reviewed  CBC - Abnormal; Notable for the following:    RBC 4.17 (*)    HCT 38.0 (*)    All other components within normal limits  BASIC METABOLIC PANEL - Abnormal; Notable for the following:    Potassium 3.3 (*)    Glucose, Bld 106 (*)    All other components within normal limits  POCT I-STAT, CHEM 8 - Abnormal; Notable for the following:    Potassium 3.4 (*)    Glucose, Bld 105 (*)    Calcium, Ion 1.11 (*)    Hemoglobin 12.9 (*)    HCT 38.0 (*)    All other components within normal limits  DIFFERENTIAL  POCT I-STAT TROPONIN I  I-STAT, CHEM 8  I-STAT TROPONIN I  URINALYSIS, ROUTINE W REFLEX MICROSCOPIC   Dg Chest Port 1 View  01/23/2012  *RADIOLOGY REPORT*  Clinical Data: Chest pain, shortness of breath, history asthma, hypertension, COPD  PORTABLE CHEST - 1 VIEW  Comparison: 11/19/2010  Findings: Normal heart size, mediastinal contours, and pulmonary vascularity. Lungs appear emphysematous but clear. No pleural effusion or pneumothorax. Lateral lower left costal margin excluded. Numerous cardiac monitoring lines project over chest. Bones appear diffusely demineralized.  IMPRESSION:  Emphysematous changes. No acute abnormalities.  Original Report Authenticated By: Lollie Marrow, M.D.     1. COPD (chronic obstructive pulmonary disease)   2. Chest pain       MDM  COPD exacerbation with atypical chest pain. He has multiple risk factors warrant admission for further evaluation and treatment. Initial troponin is negative. EKG unremarkable. Pain did not resolve with nitroglycerin. Administered 2 doses of morphine with some improvement although not resolution of his pain. He received aspirin. Chest x-ray unremarkable. Spoke to try hospitalists who accepted the patient for admission. Temp orders placed        Dayton Bailiff, MD 01/23/12 2359

## 2012-01-23 NOTE — ED Notes (Signed)
Pt reports that he has not had any relief in pain.  Dr. Brooke Dare notified ans new orders written.

## 2012-01-24 ENCOUNTER — Encounter (HOSPITAL_COMMUNITY): Payer: Self-pay | Admitting: *Deleted

## 2012-01-24 DIAGNOSIS — R079 Chest pain, unspecified: Secondary | ICD-10-CM | POA: Diagnosis present

## 2012-01-24 DIAGNOSIS — E785 Hyperlipidemia, unspecified: Secondary | ICD-10-CM | POA: Insufficient documentation

## 2012-01-24 LAB — CBC
HCT: 38.1 % — ABNORMAL LOW (ref 39.0–52.0)
MCHC: 33.6 g/dL (ref 30.0–36.0)
MCV: 92.7 fL (ref 78.0–100.0)
RDW: 13.2 % (ref 11.5–15.5)

## 2012-01-24 LAB — URINALYSIS, ROUTINE W REFLEX MICROSCOPIC
Glucose, UA: NEGATIVE mg/dL
Hgb urine dipstick: NEGATIVE
Leukocytes, UA: NEGATIVE
Protein, ur: NEGATIVE mg/dL
pH: 6 (ref 5.0–8.0)

## 2012-01-24 LAB — CARDIAC PANEL(CRET KIN+CKTOT+MB+TROPI)
CK, MB: 2 ng/mL (ref 0.3–4.0)
CK, MB: 2.2 ng/mL (ref 0.3–4.0)
Total CK: 49 U/L (ref 7–232)

## 2012-01-24 LAB — GLUCOSE, CAPILLARY

## 2012-01-24 LAB — COMPREHENSIVE METABOLIC PANEL
ALT: 49 U/L (ref 0–53)
Alkaline Phosphatase: 104 U/L (ref 39–117)
GFR calc Af Amer: >90 mL/min (ref >90)
Glucose, Bld: 172 mg/dL — ABNORMAL HIGH (ref 70–99)
Potassium: 4.2 mEq/L (ref 3.5–5.1)
Sodium: 141 mEq/L (ref 135–145)
Total Protein: 6.3 g/dL (ref 6.0–8.3)

## 2012-01-24 MED ORDER — PANTOPRAZOLE SODIUM 40 MG PO TBEC
40.0000 mg | DELAYED_RELEASE_TABLET | Freq: Every day | ORAL | Status: DC
  Administered 2012-01-24: 40 mg via ORAL
  Filled 2012-01-24: qty 1

## 2012-01-24 MED ORDER — FLUOXETINE HCL 20 MG PO CAPS
20.0000 mg | ORAL_CAPSULE | Freq: Every day | ORAL | Status: DC
  Administered 2012-01-24: 20 mg via ORAL
  Filled 2012-01-24: qty 1

## 2012-01-24 MED ORDER — ONDANSETRON HCL 4 MG/2ML IJ SOLN: 4.0000 mg | Freq: Four times a day (QID) | INTRAMUSCULAR | Status: DC | PRN

## 2012-01-24 MED ORDER — PANTOPRAZOLE SODIUM 40 MG PO TBEC: 40.0000 mg | DELAYED_RELEASE_TABLET | Freq: Every day | ORAL | Status: DC

## 2012-01-24 MED ORDER — SODIUM CHLORIDE 0.9 % IV SOLN: INTRAVENOUS | Status: DC

## 2012-01-24 MED ORDER — FEBUXOSTAT 40 MG PO TABS
80.0000 mg | ORAL_TABLET | Freq: Every day | ORAL | Status: DC
  Administered 2012-01-24: 80 mg via ORAL
  Filled 2012-01-24: qty 2

## 2012-01-24 MED ORDER — SODIUM CHLORIDE 0.9 % IJ SOLN
3.0000 mL | Freq: Two times a day (BID) | INTRAMUSCULAR | Status: DC
  Administered 2012-01-24 (×2): 3 mL via INTRAVENOUS

## 2012-01-24 MED ORDER — PREDNISONE 10 MG PO TABS
10.0000 mg | ORAL_TABLET | Freq: Every day | ORAL | Status: DC
  Administered 2012-01-24: 10 mg via ORAL
  Filled 2012-01-24 (×2): qty 1

## 2012-01-24 MED ORDER — COLCHICINE 0.6 MG PO TABS
0.6000 mg | ORAL_TABLET | Freq: Every day | ORAL | Status: DC
  Administered 2012-01-24: 0.6 mg via ORAL
  Filled 2012-01-24: qty 1

## 2012-01-24 MED ORDER — ASPIRIN EC 325 MG PO TBEC
325.0000 mg | DELAYED_RELEASE_TABLET | Freq: Every day | ORAL | Status: DC
  Administered 2012-01-24: 325 mg via ORAL
  Filled 2012-01-24: qty 1

## 2012-01-24 MED ORDER — OLOPATADINE HCL 0.1 % OP SOLN: 1.0000 [drp] | Freq: Every day | OPHTHALMIC | Status: DC | PRN

## 2012-01-24 MED ORDER — OXYCODONE HCL 15 MG PO TB12
40.0000 mg | ORAL_TABLET | Freq: Two times a day (BID) | ORAL | Status: DC
  Administered 2012-01-24 (×2): 40 mg via ORAL
  Filled 2012-01-24 (×2): qty 2

## 2012-01-24 MED ORDER — LORAZEPAM 0.5 MG PO TABS
1.0000 mg | ORAL_TABLET | Freq: Three times a day (TID) | ORAL | Status: DC
  Administered 2012-01-24 (×2): 1 mg via ORAL
  Filled 2012-01-24: qty 2
  Filled 2012-01-24 (×2): qty 1

## 2012-01-24 MED ORDER — ONDANSETRON HCL 4 MG PO TABS: 4.0000 mg | ORAL_TABLET | Freq: Four times a day (QID) | ORAL | Status: DC | PRN

## 2012-01-24 MED ORDER — ASPIRIN 325 MG PO TBEC: 325.0000 mg | DELAYED_RELEASE_TABLET | Freq: Every day | ORAL | Status: DC

## 2012-01-24 MED ORDER — ACETAMINOPHEN 650 MG RE SUPP: 650.0000 mg | Freq: Four times a day (QID) | RECTAL | Status: DC | PRN

## 2012-01-24 MED ORDER — ONDANSETRON HCL 4 MG/2ML IJ SOLN: 4.0000 mg | Freq: Three times a day (TID) | INTRAMUSCULAR | Status: DC | PRN

## 2012-01-24 MED ORDER — MONTELUKAST SODIUM 10 MG PO TABS
10.0000 mg | ORAL_TABLET | Freq: Every day | ORAL | Status: DC
  Filled 2012-01-24: qty 1

## 2012-01-24 MED ORDER — ALBUTEROL SULFATE (5 MG/ML) 0.5% IN NEBU: 2.5000 mg | INHALATION_SOLUTION | RESPIRATORY_TRACT | Status: DC | PRN

## 2012-01-24 MED ORDER — HYDROMORPHONE HCL PF 1 MG/ML IJ SOLN: 1.0000 mg | INTRAMUSCULAR | Status: DC | PRN

## 2012-01-24 MED ORDER — FENTANYL 75 MCG/HR TD PT72
75.0000 ug | MEDICATED_PATCH | TRANSDERMAL | Status: DC
  Administered 2012-01-24: 75 ug via TRANSDERMAL
  Filled 2012-01-24: qty 1

## 2012-01-24 MED ORDER — ACETAMINOPHEN 325 MG PO TABS: 650.0000 mg | ORAL_TABLET | Freq: Four times a day (QID) | ORAL | Status: DC | PRN

## 2012-01-24 NOTE — Discharge Summary (Addendum)
Patient ID: Nicholas Soto MRN: [id number] DOB/AGE: Jun 28, 1951 61 y.o.  Admit date: 01/23/2012 Discharge date: 01/24/2012  Primary Care Physician:  No primary provider on file.   Discharge Diagnoses:    Present on Admission:  .Chest pain .C O P D .PSORIATIC ARTHRITIS .TOBACCO ABUSE  Medication List  As of 01/24/2012  5:29 PM   TAKE these medications         aspirin 325 MG EC tablet   Take 1 tablet (325 mg total) by mouth daily.      colchicine 0.6 MG tablet   Take 0.6 mg by mouth daily.      febuxostat 40 MG tablet   Commonly known as: ULORIC   Take 80 mg by mouth daily.      fentaNYL 75 MCG/HR   Commonly known as: DURAGESIC - dosed mcg/hr   Place 1 patch onto the skin every 3 (three) days.      FLUoxetine 20 MG capsule   Commonly known as: PROZAC   Take 20 mg by mouth daily.      LORazepam 1 MG tablet   Commonly known as: ATIVAN   Take 1 mg by mouth every 8 (eight) hours.      montelukast 10 MG tablet   Commonly known as: SINGULAIR   Take 10 mg by mouth at bedtime.      olopatadine 0.1 % ophthalmic solution   Commonly known as: PATANOL   Place 1 drop into both eyes daily as needed. For allergy      oxyCODONE 40 MG 12 hr tablet   Commonly known as: OXYCONTIN   Take 40 mg by mouth every 12 (twelve) hours. For pain      pantoprazole 40 MG tablet   Commonly known as: PROTONIX   Take 40 mg by mouth daily.      predniSONE 10 MG tablet   Commonly known as: DELTASONE   Take 10 mg by mouth daily.             Consults: Cardiology  Significant Diagnostic Studies:  Dg Chest Port 1 View  01/23/2012  *RADIOLOGY REPORT*  Clinical Data: Chest pain, shortness of breath, history asthma, hypertension, COPD  PORTABLE CHEST - 1 VIEW  Comparison: 11/19/2010  Findings: Normal heart size, mediastinal contours, and pulmonary vascularity. Lungs appear emphysematous but clear. No pleural effusion or pneumothorax. Lateral lower left costal margin excluded. Numerous  cardiac monitoring lines project over chest. Bones appear diffusely demineralized.  IMPRESSION: Emphysematous changes. No acute abnormalities.  Original Report Authenticated By: Lollie Marrow, M.D.    Brief H and P: For complete details please refer to admission H and P, but in brief  61 year-old male with history of COPD, gout, psoriatic arthritis presented to the ER because of chest pain. Patient states he had chest pain last night around 9 PM while watching television. The chest pain was retrosternal nonradiating has no relation to exertion and there was no associated shortness of breath cough or phlegm. In the ER patient had EKG picture showing normal sinus rhythm with no acute ST changes cardiac enzyme was normal and chest x-ray was showing nothing acute. Patient chest pain got better after morphine IV and patient will be admitted for further management and observation. Patient denies any nausea vomiting abdominal pain diarrhea dizziness palpitations or any loss of consciousness.    Hospital Course:  Principal Problem:  *Chest pain Patient was admitted to telemetry floor, cardiac enzymes was negative x2, EKG was unremarkable. He  was seen by [facility] cardiology who recommended outpatient lexiscan Myoview. Discharge on aspirin 81 mg daily. Active Problems:  TOBACCO ABUSE Patient was counseled on tobacco cessation.  C O P D Patient has no wheezing and he's not on exacerbation. Continue singular follow his PCP.  PSORIATIC ARTHRITIS On chronic prednisone continue and follow with your rheumatologist   Hyperlipidemia  patient is not on statins, as per him was stopped by  his PCPabout a month ago and he will follow with his PCP on the results of his lipid panel which was recently done. Hyperglycemia  Probably secondary to steroids, patient to follow with his PCP as an outpatient for further workup. Advised to stay on carb  a modified diet and heart healthy diet.   Subjective Patient seen and  examined, denies any chest pain or shortness of breath and wants to go home.    Filed Vitals:   01/24/12 1330  BP: 120/63  Pulse: 71  Temp: 98.3 F (36.8 C)  Resp: 13    General: Alert, awake, oriented x3, in no acute distress. HEENT: No bruits, no goiter. Heart: Regular rate and rhythm, without murmurs, rubs, gallops. Lungs: Clear to auscultation bilaterally. Abdomen: Soft, nontender, nondistended, positive bowel sounds. Extremities: No clubbing cyanosis or edema with positive pedal pulses. Neuro: Grossly intact, nonfocal.   Disposition and Follow-up:  To home Follow his PCP and cardiology as an outpatient.   Time spent on Discharge: 35 minutes   Signed: Josiah Nieto 01/24/2012, 5:29 PM

## 2012-01-24 NOTE — H&P (Signed)
Nicholas Soto is an 61 y.o. male. PCP - Dr.Bouska in [facility]   Chief Complaint: Chest pain. HPI: 61 year-old male with history of COPD, gout, psoriatic arthritis presented to the ER because of chest pain. Patient states he had chest pain last night around 9 PM while watching television. The chest pain was retrosternal nonradiating has no relation to exertion and there was no associated shortness of breath cough or phlegm. In the ER patient had EKG picture showing normal sinus rhythm with no acute ST changes cardiac enzyme was normal and chest x-ray was showing nothing acute. Patient chest pain got better after morphine IV and patient will be admitted for further management and observation. Patient denies any nausea vomiting abdominal pain diarrhea dizziness palpitations or any loss of consciousness.  Past Medical History  Diagnosis Date  . Cardiac abnormality     previous CP with unknown dx  . Cancer     of the eye  . Hypertension   . COPD (chronic obstructive pulmonary disease)     Past Surgical History  Procedure Date  . Shoulder surgery   . Neck surgery     History reviewed. No pertinent family history. Social History:  reports that he has been smoking Cigarettes.  He has a 20 pack-year smoking history. He does not have any smokeless tobacco history on file. He reports that he does not drink alcohol or use illicit drugs.  Allergies:  Allergies  Allergen Reactions  . Meperidine Hcl     unknown    Medications Prior to Admission  Medication Dose Route Frequency Provider Last Rate Last Dose  . albuterol (PROVENTIL) (5 MG/ML) 0.5% nebulizer solution 5 mg  5 mg Nebulization Once Dayton Bailiff, MD   5 mg at 01/23/12 2224  . morphine 4 MG/ML injection 4 mg  4 mg Intravenous Once Dayton Bailiff, MD   4 mg at 01/23/12 2230  . morphine 4 MG/ML injection 4 mg  4 mg Intravenous Once Dayton Bailiff, MD   4 mg at 01/23/12 2324  . predniSONE (DELTASONE) tablet 60 mg  60 mg Oral Once Dayton Bailiff, MD   60 mg at 01/23/12 2228  . sodium chloride 0.9 % bolus 1,000 mL  1,000 mL Intravenous Once Dayton Bailiff, MD   1,000 mL at 01/23/12 2237   No current outpatient prescriptions on file as of 01/23/2012.    Results for orders placed during the hospital encounter of 01/23/12 (from the past 48 hour(s))  CBC     Status: Abnormal   Collection Time   01/23/12 10:35 PM      Component Value Range Comment   WBC 10.5  4.0 - 10.5 (K/uL)    RBC 4.17 (*) 4.22 - 5.81 (MIL/uL)    Hemoglobin 13.2  13.0 - 17.0 (g/dL)    HCT 16.1 (*) 09.6 - 52.0 (%)    MCV 91.1  78.0 - 100.0 (fL)    MCH 31.7  26.0 - 34.0 (pg)    MCHC 34.7  30.0 - 36.0 (g/dL)    RDW 04.5  40.9 - 81.1 (%)    Platelets 245  150 - 400 (K/uL)   DIFFERENTIAL     Status: Normal   Collection Time   01/23/12 10:35 PM      Component Value Range Comment   Neutrophils Relative 61  43 - 77 (%)    Neutro Abs 6.4  1.7 - 7.7 (K/uL)    Lymphocytes Relative 27  12 - 46 (%)  Lymphs Abs 2.9  0.7 - 4.0 (K/uL)    Monocytes Relative 8  3 - 12 (%)    Monocytes Absolute 0.8  0.1 - 1.0 (K/uL)    Eosinophils Relative 3  0 - 5 (%)    Eosinophils Absolute 0.4  0.0 - 0.7 (K/uL)    Basophils Relative 1  0 - 1 (%)    Basophils Absolute 0.1  0.0 - 0.1 (K/uL)   BASIC METABOLIC PANEL     Status: Abnormal   Collection Time   01/23/12 10:35 PM      Component Value Range Comment   Sodium 140  135 - 145 (mEq/L)    Potassium 3.3 (*) 3.5 - 5.1 (mEq/L)    Chloride 103  96 - 112 (mEq/L)    CO2 29  19 - 32 (mEq/L)    Glucose, Bld 106 (*) 70 - 99 (mg/dL)    BUN 9  6 - 23 (mg/dL)    Creatinine, Ser 4.09  0.50 - 1.35 (mg/dL)    Calcium 8.8  8.4 - 10.5 (mg/dL)    GFR calc non Af Amer >90  >90 (mL/min)    GFR calc Af Amer >90  >90 (mL/min)   POCT I-STAT TROPONIN I     Status: Normal   Collection Time   01/23/12 10:48 PM      Component Value Range Comment   Troponin i, poc 0.00  0.00 - 0.08 (ng/mL)    Comment 3            POCT I-STAT, CHEM 8     Status:  Abnormal   Collection Time   01/23/12 10:51 PM      Component Value Range Comment   Sodium 142  135 - 145 (mEq/L)    Potassium 3.4 (*) 3.5 - 5.1 (mEq/L)    Chloride 104  96 - 112 (mEq/L)    BUN 7  6 - 23 (mg/dL)    Creatinine, Ser 8.11  0.50 - 1.35 (mg/dL)    Glucose, Bld 914 (*) 70 - 99 (mg/dL)    Calcium, Ion 7.82 (*) 1.12 - 1.32 (mmol/L)    TCO2 27  0 - 100 (mmol/L)    Hemoglobin 12.9 (*) 13.0 - 17.0 (g/dL)    HCT 95.6 (*) 21.3 - 52.0 (%)    Dg Chest Port 1 View  01/23/2012  *RADIOLOGY REPORT*  Clinical Data: Chest pain, shortness of breath, history asthma, hypertension, COPD  PORTABLE CHEST - 1 VIEW  Comparison: 11/19/2010  Findings: Normal heart size, mediastinal contours, and pulmonary vascularity. Lungs appear emphysematous but clear. No pleural effusion or pneumothorax. Lateral lower left costal margin excluded. Numerous cardiac monitoring lines project over chest. Bones appear diffusely demineralized.  IMPRESSION: Emphysematous changes. No acute abnormalities.  Original Report Authenticated By: Lollie Marrow, M.D.    Review of Systems  Constitutional: Negative.   HENT: Negative.   Eyes: Negative.   Respiratory: Negative.   Cardiovascular: Positive for chest pain.  Gastrointestinal: Negative.   Genitourinary: Negative.   Musculoskeletal: Negative.   Skin: Negative.   Neurological: Negative.   Endo/Heme/Allergies: Negative.   Psychiatric/Behavioral: Negative.     Blood pressure 127/67, pulse 88, temperature 97.9 F (36.6 C), temperature source Oral, resp. rate 20, SpO2 94.00%. Physical Exam  Constitutional: He is oriented to person, place, and time. He appears well-developed and well-nourished. No distress.  HENT:  Head: Normocephalic and atraumatic.  Right Ear: External ear normal.  Left Ear: External ear normal.  Nose: Nose  normal.  Mouth/Throat: Oropharynx is clear and moist. No oropharyngeal exudate.  Eyes: Conjunctivae and EOM are normal. Pupils are equal,  round, and reactive to light. Right eye exhibits no discharge. Left eye exhibits no discharge. No scleral icterus.  Neck: Normal range of motion. Neck supple.  Cardiovascular: Normal rate, regular rhythm and normal heart sounds.   Respiratory: Effort normal and breath sounds normal. No respiratory distress. He has no wheezes. He has no rales.  GI: Soft. Bowel sounds are normal. He exhibits no distension. There is no tenderness. There is no rebound and no guarding.  Musculoskeletal: Normal range of motion. He exhibits no edema and no tenderness.  Neurological: He is alert and oriented to person, place, and time.       Moves upper and lower extremities.  Skin: Skin is warm and dry. He is not diaphoretic.  Psychiatric: His behavior is normal.     Assessment/Plan #1. Chest pain to rule out ACS - presently patient is chest pain-free. We'll cycle cardiac markers and place patient on aspirin. #2. COPD - presently patient is not wheezing and we'll continue nebulizer. #3. Ongoing tobacco abuse -  Have requested tobacco cessation counseling. #4. History of gout and psoriatic arthritis - continue present medications.  CODE STATUS - full code.  Zya Finkle N. 01/24/2012, 1:39 AM

## 2012-01-24 NOTE — Consult Note (Signed)
CARDIOLOGY CONSULT NOTE  Patient ID: Nicholas Soto, MRN: [id number], DOB/AGE: Oct 29, 1951 61 y.o. Admit date: 01/23/2012 Date of Consult: 01/24/2012  Primary Physician: Ocie Cornfield in [facility]  Primary Cardiologist: Saw Dr. Dietrich Pates in 2002, Consulted by Dr. Elease Hashimoto  Chief Complaint: Chest pain Reason for Consultation: Chest pain  HPI: 61 y.o. male w/ PMHx significant for HLD, tobacco abuse, and COPD who presented to [facility] on 01/23/2012 with complaints of chest pain.  He has a history of chest pain with the last evaluation in Nov 2011 at which time he ruled out for a MI and it was felt his chest pain was GI in nature. Echocardiogram at that time revealed mild LVH, normal systolic function (EF 55-60%), and grade 2 diastolic dysfunction. His last cardiac catheterization was in 2002 and revealed mild coronary artery disease.  He was in his usual state of health when he was watching tv last night and had sudden onset substernal chest pressure. He states that it felt like a truck was sitting on his chest. He took 4 baby ASA and after 10 mins without relief called EMS. He had no associated sob, nausea, diaphoresis, dizziness, or palpitations. The pain didn't change with exertion or position. This was unlike any chest pain he has felt in the past. He continues to smoke about 1/2 ppd, denies ETOH or drug use. States he does not have a history of high blood pressure. Does have a history of dyslipidemia, but was taken off his statin about 1 mo ago by his primary care provider bc he thinks it would interfere with a new med. He is able to walk at least 1 mile per day and climb stairs without chest pain or sob. Denies recent illness, fever, chills, change in bowels or urination. Does endorse teeth/gum pain for the last 4-5 days that limited his oral intake, but says it went away when the chest pain started.  He received 4 SL NTG by EMS with some relief of pain. In the ED his chest pain was  completely relieved with Morphine. EKG revealed NSR with NO acute ischemic changes. CXR was without acute cardiopulmonary findings. Initial poc troponin was negative.  Cardiac enzymes have remained negative x 2. He remains chest pain free and states he would like to go home.  Past Medical History  Diagnosis Date  . Cardiac abnormality     previous CP thought r/t GI; mild CAD by cath 2002  . Cancer     of the eye  . Hypertension     pt denies h/o htn  . COPD (chronic obstructive pulmonary disease)   . Hyperlipidemia     taken off statin 1 mo ago by primary care bc he was "put on a new med that might interfere"  . Tobacco abuse   . 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 Gout     Past Medical History  Diagnosis Date  . Cardiac abnormality     previous CP with unknown dx  . Cancer     of the eye  . COPD (chronic obstructive pulmonary disease)    2D Echocardiogram - 11/20/10  Study Conclusions:   - Left ventricle: The cavity size was normal. Wall thickness was increased in a pattern of mild LVH. Systolic function was normal. The estimated ejection fraction was in the range of 55% to 60%. Wall motion was normal; there were no regional wall motion abnormalities. Features are consistent with a  pseudonormal left ventricular filling pattern, with concomitant abnormal relaxation and increased filling pressure (grade 2 diastolic dysfunction).   - Aortic valve: Mildly calcified annulus. Trileaflet.   - Mitral valve: Trivial regurgitation.   - Tricuspid valve: Mild regurgitation.   - Pulmonary arteries: PA peak pressure: 29mm Hg (S).   - Pericardium, extracardiac: There was no pericardial effusion.  10/02/01 - Cardiac Cath Left main trunk:  The left main trunk is medium caliber vessel and is angiographically normal. Left anterior descending:  The left anterior descending is a medium caliber vessel which provides two small diagonal branches.  There is mild diffuse disease of  30% in the mid LAD.  Left circumflex artery:  The left circumflex artery is a medium caliber vessel which consists of a _______ first marginal branch, proximal large second marginal branch in the mid section.  There is mild disease of 30% in the AV circumflex. Right coronary artery:  The right coronary artery is dominant, medium caliber vessel that supplies the posterior descending artery and several small posterior ventricular branches terminal segment.  There is moderate disease of 30-40% in the mid right coronary artery. LEFT VENTRICULOGRAM:  Normal end-systolic and end-diastolic dimensions. Overall left ventricular function is well-preserved with an ejection fraction of greater than 55%.  There is no mitral regurgitation.  The LV pressure is 110/10, aortic 110/65.  The left ventricular end-diastolic pressure is 16.  Surgical History:  Procedure Date  . Shoulder surgery   . Neck surgery     Home Meds: Medication Sig  colchicine 0.6 MG tablet Take 0.6 mg by mouth daily.  febuxostat (ULORIC) 40 MG tablet Take 80 mg by mouth daily.  fentaNYL (DURAGESIC - DOSED MCG/HR) 75 MCG/HR Place 1 patch onto the skin every 3 (three) days.  FLUoxetine (PROZAC) 20 MG capsule Take 20 mg by mouth daily.  LORazepam (ATIVAN) 1 MG tablet Take 1 mg by mouth every 8 (eight) hours.  montelukast (SINGULAIR) 10 MG tablet Take 10 mg by mouth at bedtime.  olopatadine (PATANOL) 0.1 % ophthalmic solution Place 1 drop into both eyes daily as needed. For allergy  oxyCODONE (OXYCONTIN) 40 MG 12 hr tablet Take 40 mg by mouth every 12 (twelve) hours. For pain  pantoprazole (PROTONIX) 40 MG tablet Take 40 mg by mouth daily.  predniSONE (DELTASONE) 10 MG tablet Take 10 mg by mouth daily.   Inpatient Medications:   . albuterol  5 mg Nebulization Once  . aspirin EC  325 mg Oral Daily  . colchicine  0.6 mg Oral Daily  . febuxostat  80 mg Oral Daily  . fentaNYL  75 mcg Transdermal Q72H  . FLUoxetine  20 mg Oral Daily  .  LORazepam  1 mg Oral Q8H  . montelukast  10 mg Oral QHS  .  morphine injection  4 mg Intravenous Once  .  morphine injection  4 mg Intravenous Once  . oxyCODONE  40 mg Oral BID  . pantoprazole  40 mg Oral Daily  . predniSONE  10 mg Oral Q breakfast  . predniSONE  60 mg Oral Once  . sodium chloride  1,000 mL Intravenous Once  . sodium chloride  3 mL Intravenous Q12H    Allergies:  Allergen Reactions  . Meperidine Hcl     unknown   Social History  . Marital Status: Married   Occupational History  . Retired from [employer]   Social History Main Topics  . Smoking status: Smoker, Current Status Unknown -- 1.0 packs/day for  20 years    Types: Cigarettes  . Alcohol Use: No  . Drug Use: No  . Sexually Active: Yes   Family history:  Father died of an "heart explosion" 2/2 TB.  Mother died of MI at age of 61. Brother had MI at age 24  Review of Systems: General: negative for chills, fever, night sweats or weight changes.  Cardiovascular: (+) chest pain; otherwise negative for shortness of breath, dyspnea on exertion, edema, orthopnea, palpitations, or paroxysmal nocturnal dyspnea Dermatological: negative for rash Respiratory: negative for cough or wheezing Urologic: negative for hematuria Abdominal: negative for nausea, vomiting, diarrhea, bright red blood per rectum, melena, or hematemesis Neurologic: negative for visual changes, syncope, or dizziness All other systems reviewed and are otherwise negative except as noted above.  Labs:  [facility] 01/24/12 1005 01/24/12 0234  CKTOTAL 53 49  CKMB 2.2 2.0  TROPONINI <0.30 <0.30     01/23/2012 22:48  Troponin i, poc 0.00   Lab Results  Component Value Date   WBC 7.0 01/24/2012   HGB 12.8* 01/24/2012   HCT 38.1* 01/24/2012   MCV 92.7 01/24/2012   PLT 243 01/24/2012     Lab 01/24/12 0645  NA 141  K 4.2  CL 104  CO2 25  BUN 7  CREATININE 0.75  CALCIUM 8.5  PROT 6.3  BILITOT 0.2*  ALKPHOS 104  ALT 49  AST 51*    GLUCOSE 172*    Radiology/Studies:  Dg Chest Port 1 View 01/23/2012    Findings: Normal heart size, mediastinal contours, and pulmonary vascularity. Lungs appear emphysematous but clear. No pleural effusion or pneumothorax. Lateral lower left costal margin excluded. Numerous cardiac monitoring lines project over chest. Bones appear diffusely demineralized.  IMPRESSION: Emphysematous changes. No acute abnormalities.    EKG: 01/23/12 @ 2136 - Normal sinus rhythm 78bpm, NO acute ischemic changes  Physical Exam: Blood pressure 120/63, pulse 71, temperature 98.3 F (36.8 C), temperature source Oral, resp. rate 13, height 5\' 8"  (1.727 m), weight 182 lb 5.1 oz (82.7 kg), SpO2 95.00%. General: Well developed, well nourished, white male in no acute distress. Head: Normocephalic, atraumatic, sclera non-icteric, no xanthomas, nares are without discharge.  Neck: Supple. Negative for carotid bruits. JVD not elevated. Lungs: Diminished throughout  With a slight wheeze with forced expiration Heart: RRR with S1 S2. No murmurs, rubs, or gallops appreciated. Abdomen: Soft, non-tender, non-distended with normoactive bowel sounds. No hepatomegaly. No rebound/guarding. No obvious abdominal masses. Msk:  Strength and tone appear normal for age. Extremities: No clubbing or cyanosis. No edema.  Distal pedal pulses are 2+ and equal bilaterally. Neuro: Alert and oriented X 3. Moves all extremities spontaneously. Psych:  Responds to questions appropriately with a normal affect.   Assessment and Plan:  61 y.o. male w/ PMHx significant for HLD, tobacco abuse, and COPD who presented to [facility] on 01/23/2012 with complaints of chest pain.  1. Chest Pain: Cardiac enzymes negative x 2. EKG without acute ischemic changes and CXR without acute cardiopulmonary findings. Patient is chest pain free. He has cardiac risk factors of hyperlipidemia, tobacco abuse, and family history. Would benefit from further ischemic  work up with stress testing. Has chronic feet pain 2/2 "nerve damage" so would need Lexiscan myoview as an outpatient. Cont 81mg  ASA and PPI.  2. Hypelipidemia: Not on antihyperlipidemic therapy as his PCP stopped it one month ago per patient. He states she checked his lipids just recently and is waiting on the results. Would encourage him to  follow up and discuss need for statin therapy.  3. Tobacco abuse: Received tobacco cessation counseling. He reports he has decreased from 2ppd and understands importance of quitting.  Signed, HOPE, JESSICA PA-C 01/24/2012, 3:04 PM  Attending Note:   The patient was seen and examined.  Agree with assessment and plan as noted above.  Pt presents with chest pain. His EKG is completely normal. He's had  negative cardiac enzymes.    Exam is unremarkable. He has slight wheezing due to his COPD.  His EKG is normal   Impression: Atypical chest pain. He does have several risk factors for coronary artery disease. I think it we should proceed with an outpatient stress Myoview study.  We'll see him in followup sooner if needed.   Vesta Mixer, Montez Hageman., MD, [facility] 01/24/2012, 3:06 PM

## 2012-01-24 NOTE — ED Notes (Signed)
Admitting MD at bedside.

## 2012-01-24 NOTE — Progress Notes (Signed)
01/24/12 Nursing 1814 DC IV, DC Tele, DC Home. Discharge instructions and home medications discussed with patient and patient's wife. Patient denied any questions or concerns at that time. Patient leaving unit ambulatory and appears in no acute distress. Ernesta Amble, RN

## 2012-01-24 NOTE — Consult Note (Signed)
Pt smokes 1 ppd and is not ready to quit yet. Discussed risk factors. Advised and encouraged pt to quit. Referred to 1-800 quit now for f/u and support. Discussed oral fixation substitutes, second hand smoke and in home smoking policy. Reviewed and gave pt Written education/contact information.

## 2012-01-24 NOTE — ED Notes (Signed)
Pt attempted to pee at bedside. Pt unable to pee. Admitting MD waiting to see pt prior to going upstairs.

## 2012-01-24 NOTE — Progress Notes (Signed)
Utilization Review Completed.Chauncey Sciulli T1/28/2013   

## 2012-01-27 ENCOUNTER — Emergency Department (HOSPITAL_COMMUNITY): Payer: 59

## 2012-01-27 ENCOUNTER — Other Ambulatory Visit: Payer: Self-pay

## 2012-01-27 ENCOUNTER — Inpatient Hospital Stay (HOSPITAL_COMMUNITY)
Admission: EM | Admit: 2012-01-27 | Discharge: 2012-01-28 | DRG: 192 | Disposition: A | Discharge status: Home / Self Care | Payer: 59 | Source: Ambulatory Visit | Attending: Internal Medicine | Admitting: Internal Medicine

## 2012-01-27 ENCOUNTER — Encounter (HOSPITAL_COMMUNITY): Payer: Self-pay | Admitting: *Deleted

## 2012-01-27 DIAGNOSIS — M109 Gout, unspecified: Secondary | ICD-10-CM | POA: Diagnosis present

## 2012-01-27 DIAGNOSIS — IMO0002 Reserved for concepts with insufficient information to code with codable children: Secondary | ICD-10-CM

## 2012-01-27 DIAGNOSIS — J984 Other disorders of lung: Secondary | ICD-10-CM | POA: Diagnosis present

## 2012-01-27 DIAGNOSIS — Z7982 Long term (current) use of aspirin: Secondary | ICD-10-CM

## 2012-01-27 DIAGNOSIS — E785 Hyperlipidemia, unspecified: Secondary | ICD-10-CM | POA: Diagnosis present

## 2012-01-27 DIAGNOSIS — G8929 Other chronic pain: Secondary | ICD-10-CM | POA: Diagnosis present

## 2012-01-27 DIAGNOSIS — G5790 Unspecified mononeuropathy of unspecified lower limb: Secondary | ICD-10-CM | POA: Diagnosis present

## 2012-01-27 DIAGNOSIS — F17200 Nicotine dependence, unspecified, uncomplicated: Secondary | ICD-10-CM | POA: Diagnosis present

## 2012-01-27 DIAGNOSIS — I2510 Atherosclerotic heart disease of native coronary artery without angina pectoris: Secondary | ICD-10-CM | POA: Diagnosis present

## 2012-01-27 DIAGNOSIS — Z79899 Other long term (current) drug therapy: Secondary | ICD-10-CM

## 2012-01-27 DIAGNOSIS — I959 Hypotension, unspecified: Secondary | ICD-10-CM | POA: Diagnosis present

## 2012-01-27 DIAGNOSIS — M549 Dorsalgia, unspecified: Secondary | ICD-10-CM | POA: Diagnosis present

## 2012-01-27 DIAGNOSIS — Z85840 Personal history of malignant neoplasm of eye: Secondary | ICD-10-CM

## 2012-01-27 DIAGNOSIS — J441 Chronic obstructive pulmonary disease with (acute) exacerbation: Secondary | ICD-10-CM | POA: Diagnosis present

## 2012-01-27 DIAGNOSIS — L4050 Arthropathic psoriasis, unspecified: Secondary | ICD-10-CM | POA: Diagnosis present

## 2012-01-27 DIAGNOSIS — I10 Essential (primary) hypertension: Secondary | ICD-10-CM | POA: Diagnosis present

## 2012-01-27 HISTORY — DX: Shortness of breath: R06.02

## 2012-01-27 LAB — CBC
Platelets: 178 10*3/uL (ref 150–400)
RBC: 3.92 MIL/uL — ABNORMAL LOW (ref 4.22–5.81)
RDW: 13.4 % (ref 11.5–15.5)
WBC: 7.6 10*3/uL (ref 4.0–10.5)

## 2012-01-27 LAB — DIFFERENTIAL
Basophils Absolute: 0 10*3/uL (ref 0.0–0.1)
Eosinophils Relative: 1 % (ref 0–5)
Lymphocytes Relative: 12 % (ref 12–46)
Lymphs Abs: 0.9 10*3/uL (ref 0.7–4.0)
Neutrophils Relative %: 80 % — ABNORMAL HIGH (ref 43–77)

## 2012-01-27 LAB — COMPREHENSIVE METABOLIC PANEL
ALT: 29 U/L (ref 0–53)
AST: 57 U/L — ABNORMAL HIGH (ref 0–37)
Alkaline Phosphatase: 88 U/L (ref 39–117)
CO2: 27 mEq/L (ref 19–32)
Calcium: 8.7 mg/dL (ref 8.4–10.5)
GFR calc Af Amer: >90 mL/min (ref >90)
GFR calc non Af Amer: >90 mL/min (ref >90)
Glucose, Bld: 111 mg/dL — ABNORMAL HIGH (ref 70–99)
Potassium: 3.6 mEq/L (ref 3.5–5.1)
Sodium: 132 mEq/L — ABNORMAL LOW (ref 135–145)
Total Protein: 6.4 g/dL (ref 6.0–8.3)

## 2012-01-27 LAB — URINALYSIS, ROUTINE W REFLEX MICROSCOPIC
Bilirubin Urine: NEGATIVE
Hgb urine dipstick: NEGATIVE
Ketones, ur: 15 mg/dL — AB
Specific Gravity, Urine: 1.014 (ref 1.005–1.030)
pH: 5.5 (ref 5.0–8.0)

## 2012-01-27 LAB — TROPONIN I: Troponin I: <0.3 ng/mL (ref <0.30)

## 2012-01-27 LAB — RAPID URINE DRUG SCREEN, HOSP PERFORMED
Barbiturates: NOT DETECTED
Benzodiazepines: NOT DETECTED
Cocaine: NOT DETECTED
Tetrahydrocannabinol: NOT DETECTED

## 2012-01-27 MED ORDER — ALBUTEROL SULFATE (5 MG/ML) 0.5% IN NEBU
INHALATION_SOLUTION | RESPIRATORY_TRACT | Status: AC
  Administered 2012-01-27: 5 mg via RESPIRATORY_TRACT
  Filled 2012-01-27: qty 2

## 2012-01-27 MED ORDER — SODIUM CHLORIDE 0.9 % IJ SOLN: 3.0000 mL | INTRAMUSCULAR | Status: DC | PRN

## 2012-01-27 MED ORDER — OXYCODONE HCL 40 MG PO TB12
40.0000 mg | ORAL_TABLET | Freq: Two times a day (BID) | ORAL | Status: DC
  Administered 2012-01-27 – 2012-01-28 (×2): 40 mg via ORAL
  Filled 2012-01-27 (×3): qty 1

## 2012-01-27 MED ORDER — FLUOXETINE HCL 20 MG PO CAPS
20.0000 mg | ORAL_CAPSULE | Freq: Every day | ORAL | Status: DC
  Administered 2012-01-27 – 2012-01-28 (×2): 20 mg via ORAL
  Filled 2012-01-27 (×3): qty 1

## 2012-01-27 MED ORDER — ALBUTEROL SULFATE (5 MG/ML) 0.5% IN NEBU: 2.5000 mg | INHALATION_SOLUTION | RESPIRATORY_TRACT | Status: DC | PRN

## 2012-01-27 MED ORDER — ASPIRIN 81 MG PO CHEW
162.0000 mg | CHEWABLE_TABLET | Freq: Two times a day (BID) | ORAL | Status: DC
  Administered 2012-01-27 – 2012-01-28 (×2): 162 mg via ORAL
  Filled 2012-01-27 (×3): qty 2

## 2012-01-27 MED ORDER — ONDANSETRON HCL 4 MG/2ML IJ SOLN
INTRAMUSCULAR | Status: AC
  Administered 2012-01-27: 10:00:00
  Filled 2012-01-27: qty 2

## 2012-01-27 MED ORDER — SODIUM CHLORIDE 0.9 % IV SOLN: 250.0000 mL | INTRAVENOUS | Status: DC | PRN

## 2012-01-27 MED ORDER — FENTANYL 50 MCG/HR TD PT72
75.0000 ug | MEDICATED_PATCH | TRANSDERMAL | Status: DC
  Administered 2012-01-27: 75 ug via TRANSDERMAL
  Filled 2012-01-27: qty 1

## 2012-01-27 MED ORDER — METHYLPREDNISOLONE SODIUM SUCC 125 MG IJ SOLR
60.0000 mg | Freq: Three times a day (TID) | INTRAMUSCULAR | Status: DC
  Administered 2012-01-27 – 2012-01-28 (×3): 60 mg via INTRAVENOUS
  Filled 2012-01-27 (×2): qty 0.96
  Filled 2012-01-27: qty 2
  Filled 2012-01-27 (×3): qty 0.96

## 2012-01-27 MED ORDER — MONTELUKAST SODIUM 10 MG PO TABS
10.0000 mg | ORAL_TABLET | Freq: Every day | ORAL | Status: DC
  Administered 2012-01-27 – 2012-01-28 (×2): 10 mg via ORAL
  Filled 2012-01-27 (×2): qty 1

## 2012-01-27 MED ORDER — ALBUTEROL SULFATE (5 MG/ML) 0.5% IN NEBU
2.5000 mg | INHALATION_SOLUTION | Freq: Four times a day (QID) | RESPIRATORY_TRACT | Status: DC
  Administered 2012-01-27 – 2012-01-28 (×3): 2.5 mg via RESPIRATORY_TRACT
  Filled 2012-01-27 (×3): qty 0.5

## 2012-01-27 MED ORDER — ENOXAPARIN SODIUM 40 MG/0.4ML ~~LOC~~ SOLN
40.0000 mg | SUBCUTANEOUS | Status: DC
  Administered 2012-01-27: 40 mg via SUBCUTANEOUS
  Filled 2012-01-27 (×2): qty 0.4

## 2012-01-27 MED ORDER — IPRATROPIUM BROMIDE 0.02 % IN SOLN
RESPIRATORY_TRACT | Status: AC
  Filled 2012-01-27: qty 2.5

## 2012-01-27 MED ORDER — COLCHICINE 0.6 MG PO TABS
0.6000 mg | ORAL_TABLET | Freq: Every day | ORAL | Status: DC | PRN
  Filled 2012-01-27: qty 1

## 2012-01-27 MED ORDER — METHYLPREDNISOLONE SODIUM SUCC 125 MG IJ SOLR
INTRAMUSCULAR | Status: AC
  Filled 2012-01-27: qty 2

## 2012-01-27 MED ORDER — ALBUTEROL SULFATE (5 MG/ML) 0.5% IN NEBU
5.0000 mg | INHALATION_SOLUTION | Freq: Once | RESPIRATORY_TRACT | Status: AC
  Administered 2012-01-27: 5 mg via RESPIRATORY_TRACT
  Filled 2012-01-27: qty 1

## 2012-01-27 MED ORDER — MOXIFLOXACIN HCL IN NACL 400 MG/250ML IV SOLN
400.0000 mg | INTRAVENOUS | Status: DC
  Administered 2012-01-27: 400 mg via INTRAVENOUS
  Filled 2012-01-27 (×3): qty 250

## 2012-01-27 MED ORDER — METHYLPREDNISOLONE SODIUM SUCC 125 MG IJ SOLR: 125.0000 mg | Freq: Once | INTRAMUSCULAR | Status: DC

## 2012-01-27 MED ORDER — PANTOPRAZOLE SODIUM 40 MG PO TBEC
40.0000 mg | DELAYED_RELEASE_TABLET | Freq: Every day | ORAL | Status: DC
Start: 2012-01-27 — End: 2012-01-28
  Administered 2012-01-27 – 2012-01-28 (×2): 40 mg via ORAL
  Filled 2012-01-27: qty 1

## 2012-01-27 MED ORDER — ALBUTEROL SULFATE HFA 108 (90 BASE) MCG/ACT IN AERS
2.0000 | INHALATION_SPRAY | Freq: Four times a day (QID) | RESPIRATORY_TRACT | Status: DC
Start: 2012-01-27 — End: 2012-01-27

## 2012-01-27 MED ORDER — OLOPATADINE HCL 0.1 % OP SOLN
1.0000 [drp] | Freq: Every day | OPHTHALMIC | Status: DC | PRN
  Filled 2012-01-27: qty 5

## 2012-01-27 MED ORDER — SODIUM CHLORIDE 0.9 % IJ SOLN
3.0000 mL | Freq: Two times a day (BID) | INTRAMUSCULAR | Status: DC
  Administered 2012-01-27: 3 mL via INTRAVENOUS

## 2012-01-27 MED ORDER — ACETAMINOPHEN 325 MG PO TABS
650.0000 mg | ORAL_TABLET | Freq: Once | ORAL | Status: AC
  Administered 2012-01-27: 650 mg via ORAL
  Filled 2012-01-27: qty 2

## 2012-01-27 MED ORDER — LORAZEPAM 1 MG PO TABS: 1.0000 mg | ORAL_TABLET | Freq: Every evening | ORAL | Status: DC | PRN

## 2012-01-27 MED ORDER — IPRATROPIUM BROMIDE 0.02 % IN SOLN
0.5000 mg | Freq: Four times a day (QID) | RESPIRATORY_TRACT | Status: DC
  Administered 2012-01-27: 0.5 mg via RESPIRATORY_TRACT
  Administered 2012-01-27: 10:00:00 via RESPIRATORY_TRACT
  Administered 2012-01-28: 0.5 mg via RESPIRATORY_TRACT
  Filled 2012-01-27 (×3): qty 2.5

## 2012-01-27 NOTE — ED Notes (Signed)
Per pts wife- pt was unable to remember events of yesterday. States that throughout the day yesterday he was not acting normal and would state that he was having pain in his back. States that the pain was not normal for him. Also states that his rt arm began to swell overnight last night. Pt noted to have multiple bruise that he states are from his recent hospital stay.

## 2012-01-27 NOTE — H&P (Signed)
PCP:  No primary provider on file.   DOA:  01/27/2012  8:54 AM  Chief Complaint:  Shortness of breath since 1 day  HPI: 61 y/o male with hx of COPD, active smoking, chronic back pain, HL and psoriatic arthritis was recently admitted to the hospital for atypical chest pain and discharged on 1/27 came to the ED with shortness of breath with wheezing since this morning. He denies any cough or headache but is have subjective fevers since yesterday and felt diaphoretic this morning. He also had some chest discomfort which was similar to his chest pain few days ago when he was admitted. His chest discomfort lasted for few minutes and was resolved by the time EMS arrived. EMS noted that he was  was note to be desating in high 80s.he was given a breathing treatment in ED after which his dyspnea improved he ws also noted to have a temp of 100.53F. Patient planned for admitting to medical floor for COPD exacerbation. He also informs that he does not have much recollections of the events from yesterday. As per wife he was  Up and moving in and around the house but patient does not recall about it much. He had a Head CT done in ED which was unremarkable.   Allergies: Allergies  Allergen Reactions  . Meperidine Hcl Nausea And Vomiting    unknown    Prior to Admission medications   Medication Sig Start Date End Date Taking? Authorizing Provider  albuterol (PROVENTIL) (2.5 MG/3ML) 0.083% nebulizer solution Take 2.5 mg by nebulization every 6 (six) hours as needed. For shortness of breath   Yes Historical Provider, MD  aspirin 81 MG chewable tablet Chew 162 mg by mouth 2 (two) times daily.   Yes Historical Provider, MD  colchicine 0.6 MG tablet Take 0.6 mg by mouth daily as needed. For gout   Yes Historical Provider, MD  febuxostat (ULORIC) 40 MG tablet Take 80 mg by mouth daily as needed. For gout   Yes Historical Provider, MD  fentaNYL (DURAGESIC - DOSED MCG/HR) 75 MCG/HR Place 1 patch onto the skin every 3  (three) days.   Yes Historical Provider, MD  FLUoxetine (PROZAC) 20 MG capsule Take 20 mg by mouth daily.   Yes Historical Provider, MD  ipratropium-albuterol (DUONEB) 0.5-2.5 (3) MG/3ML SOLN Take 3 mLs by nebulization daily as needed. For when he has trouble breathing   Yes Historical Provider, MD  LORazepam (ATIVAN) 1 MG tablet Take 1 mg by mouth at bedtime as needed. For anxiety & sleep   Yes Historical Provider, MD  montelukast (SINGULAIR) 10 MG tablet Take 10 mg by mouth daily.    Yes Historical Provider, MD  olopatadine (PATANOL) 0.1 % ophthalmic solution Place 1 drop into both eyes daily as needed. For allergy   Yes Historical Provider, MD  oxyCODONE (OXYCONTIN) 40 MG 12 hr tablet Take 40 mg by mouth every 12 (twelve) hours. For pain   Yes Historical Provider, MD  pantoprazole (PROTONIX) 40 MG tablet Take 40 mg by mouth daily.   Yes Historical Provider, MD  predniSONE (DELTASONE) 10 MG tablet Take 10 mg by mouth daily.   Yes Historical Provider, MD    Past Medical History  Diagnosis Date  . Chest pain     previous CP thought r/t GI; mild CAD by cath 2002  . Cancer     of the eye  . Hypertension     pt denies h/o htn  . COPD (chronic obstructive pulmonary disease)   .  Hyperlipidemia     taken off statin 1 mo ago by primary care bc he was "put on a new med that might interfere"  . Tobacco abuse   . 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 Gout     Past Surgical History  Procedure Date  . Shoulder surgery   . Neck surgery     Social History:  reports that he has been smoking Cigarettes.  He has been smoking since a young age and as per wife smokes almost over a pack per day. He tried quitting for a year but went back to smoking due to excessive weight gain. He reports that he does not drink alcohol or use illicit drugs.  Family History  Problem Relation Age of Onset  . Heart attack Mother 31  . Heart attack Brother 62    Review of Systems:    Constitutional: positive for  fever, chills, diaphoresis, denies appetite change and fatigue.  HEENT: Denies photophobia, eye pain, redness, hearing loss, ear pain, congestion, sore throat, rhinorrhea, sneezing, mouth sores, trouble swallowing, neck pain, neck stiffness and tinnitus.   Respiratory: positive for SOB, DOE, chest tightness,  and wheezing.   Cardiovascular: Denies chest pain, palpitations and leg swelling.  Gastrointestinal: Denies nausea, vomiting, abdominal pain, diarrhea, constipation, blood in stool and abdominal distention.  Genitourinary: Denies dysuria, urgency, frequency, hematuria, flank pain and difficulty urinating.  Musculoskeletal: Denies myalgias,chronic  back pain, joint swelling, arthralgias and gait problem.  Skin: Denies pallor, rash and wound.  Neurological: Denies dizziness, seizures, syncope, weakness, light-headedness, numbness and headaches.  Hematological: Denies adenopathy. Easy bruising, personal or family bleeding history  Psychiatric/Behavioral: Denies suicidal ideation, mood changes, confusion, nervousness, sleep disturbance and agitation   Physical Exam:  Filed Vitals:   01/27/12 0915 01/27/12 0930 01/27/12 1010 01/27/12 1045  BP: 145/100 118/54    Pulse: 105 101  89  Temp:   100.7 F (38.2 C)   TempSrc:   Rectal   Resp: 14 12  15   SpO2: 89% 91%  93%    Constitutional: Vital signs reviewed.  Patient is a well-developed and well-nourished in no acute distress and cooperative with exam. appears diaphoretic HEENT: no pallor, moist oral mucosa, no LAD Cardiovascular: RRR, S1 normal, S2 normal, no MRG, pulses symmetric and intact bilaterally Pulmonary/Chest: Diminished breath sounds b/l, no added sounds Abdominal: Soft. Non-tender, non-distended, bowel sounds are normal, no masses, organomegaly, or guarding present.  GU: no CVA tenderness Musculoskeletal: No joint deformities, erythema, or stiffness, ROM full and no nontender Ext: no edema and  no cyanosis, pulses palpable bilaterally (DP and PT) Hematology: no cervical, inginal, or axillary adenopathy.  Neurological: A&O x3, Strenght is normal and symmetric bilaterally, cranial nerve II-XII are grossly intact, no focal motor deficit, sensory intact to light touch bilaterally.  Skin: Warm, dry and intact. No rash, cyanosis, or clubbing.  Psychiatric: Normal mood and affect. speech and behavior is normal. Judgment and thought content normal. Cognition and memory are normal.   Labs on Admission:  Results for orders placed during the hospital encounter of 01/27/12 (from the past 48 hour(s))  CBC     Status: Abnormal   Collection Time   01/27/12  9:09 AM      Component Value Range Comment   WBC 7.6  4.0 - 10.5 (K/uL)    RBC 3.92 (*) 4.22 - 5.81 (MIL/uL)    Hemoglobin 12.5 (*) 13.0 - 17.0 (g/dL)    HCT 78.2 (*)  39.0 - 52.0 (%)    MCV 91.3  78.0 - 100.0 (fL)    MCH 31.9  26.0 - 34.0 (pg)    MCHC 34.9  30.0 - 36.0 (g/dL)    RDW 16.1  09.6 - 04.5 (%)    Platelets 178  150 - 400 (K/uL)   DIFFERENTIAL     Status: Abnormal   Collection Time   01/27/12  9:09 AM      Component Value Range Comment   Neutrophils Relative 80 (*) 43 - 77 (%)    Neutro Abs 6.1  1.7 - 7.7 (K/uL)    Lymphocytes Relative 12  12 - 46 (%)    Lymphs Abs 0.9  0.7 - 4.0 (K/uL)    Monocytes Relative 7  3 - 12 (%)    Monocytes Absolute 0.5  0.1 - 1.0 (K/uL)    Eosinophils Relative 1  0 - 5 (%)    Eosinophils Absolute 0.0  0.0 - 0.7 (K/uL)    Basophils Relative 0  0 - 1 (%)    Basophils Absolute 0.0  0.0 - 0.1 (K/uL)   COMPREHENSIVE METABOLIC PANEL     Status: Abnormal   Collection Time   01/27/12  9:09 AM      Component Value Range Comment   Sodium 132 (*) 135 - 145 (mEq/L)    Potassium 3.6  3.5 - 5.1 (mEq/L)    Chloride 94 (*) 96 - 112 (mEq/L)    CO2 27  19 - 32 (mEq/L)    Glucose, Bld 111 (*) 70 - 99 (mg/dL)    BUN 13  6 - 23 (mg/dL)    Creatinine, Ser 4.09  0.50 - 1.35 (mg/dL)    Calcium 8.7  8.4 - 10.5  (mg/dL)    Total Protein 6.4  6.0 - 8.3 (g/dL)    Albumin 3.5  3.5 - 5.2 (g/dL)    AST 57 (*) 0 - 37 (U/L)    ALT 29  0 - 53 (U/L)    Alkaline Phosphatase 88  39 - 117 (U/L)    Total Bilirubin 0.7  0.3 - 1.2 (mg/dL)    GFR calc non Af Amer >90  >90 (mL/min)    GFR calc Af Amer >90  >90 (mL/min)   TROPONIN I     Status: Normal   Collection Time   01/27/12  9:12 AM      Component Value Range Comment   Troponin I <0.30  <0.30 (ng/mL)     Radiological Exams on Admission: Clinical Data: Respiratory distress  CHEST - 2 VIEW  Comparison: 01/23/2012  Findings: The lungs are hyperinflated but clear.  Heart size and mediastinal contours appear normal.  No pleural effusion or edema. No airspace consolidation.  IMPRESSION:  1. No active cardiopulmonary abnormalities.  CT HEAD WITHOUT CONTRAST  Technique: Contiguous axial images were obtained from the base of  the skull through the vertex without contrast.  Comparison: 06/08/2010  Findings: Bone windows demonstrate mucosal thickening of the right  sphenoid sinus is new. Clear mastoid air cells.  Soft tissue windows demonstrate no mass lesion, hemorrhage,  hydrocephalus, acute infarct, intra-axial, or extra-axial fluid  collection.  IMPRESSION:  1. No acute intracranial abnormality.  2. Sinus disease.   Assessment/Plan 61 y/o male with hx of COPD, active smoking, chronic back pain, HL and psoriatic arthritis was recently admitted to the hospital for atypical chest pain and discharged on 1/27 came to the ED with shortness of breath with wheezing since  this morning.   Principal Problem:   *COPD exacerbation Patient given IV solumedrol and nebs in the ED with improvement Will admit for COPD exacerbation. Will continue with IV solumedrol 60 mg q 8hrs  cont albuterol and Atrovent nebs Patient has had PFTs done in past and follws with Dr Craige Cotta He has been counseled on smoking cessation in past and repeated today on admission but he does  not want to quit. Cont o2 via [place] Will add avelox He was on symbicort as outpatient which should be continued on dsicharge   confusion  no clear cause  head CT unremarkable  will check UA Patient is clearly oriented at present  possible in the setting of narcotic use. Will monitor closely  Tobacco abuse  counseled on smoking cessation but he does not want to quit due to ill effect to his health and abnormal weight gains on quitting before  Chronic back pain  cont fentanyl patch and oxycodone with close monitoring of change in mental status  Hypotension  patient on chronic low dose prednisone for hx of low BP in past. He is currently on solumedrol  And can be resumed on discharge  Diet: cardiac  DVT prophylaxis: early ambulation  Time Spent on Admission: 45 minutes  Keerthi Hazell 01/27/2012, 12:41 PM

## 2012-01-27 NOTE — ED Notes (Signed)
Per Ems- pt began having difficulty breathing starting at 7am. Pt received 10 of albuterol, solumedrol, atrovent, and zofran en route. BP 160/70, HR 90, SPO2 was 82 at scene.

## 2012-01-27 NOTE — ED Notes (Signed)
Received report from [place], Charity fundraiser.  Patient moved to room 21 yellow.  Patient placed in NAD at this time.

## 2012-01-27 NOTE — ED Notes (Signed)
Respiratory called for ABG

## 2012-01-27 NOTE — Progress Notes (Signed)
Utilization Review Completed.Nicholas Soto T1/31/2013   

## 2012-01-27 NOTE — ED Notes (Signed)
Pt has a fentanyl patch on left upper arm.

## 2012-01-27 NOTE — ED Provider Notes (Signed)
History     CSN: [id number]  Arrival date & time 01/27/12  7829   First MD Initiated Contact with Patient 01/27/12 [phone]      Chief Complaint  Patient presents with  . Respiratory Distress    (Consider location/radiation/quality/duration/timing/severity/associated sxs/prior treatment) HPI Comments: 61 yo male with hx of COPD presenting in respiratory distress.  States developed dyspnea this morning.  Denies cough or fever, but states that he cannot remember anything about yesterday.  Denies drug use or alcohol.    Patient is a 61 y.o. male presenting with shortness of breath. The history is provided by the patient and the EMS personnel.  Shortness of Breath  The current episode started today. The onset was gradual. The problem occurs continuously. The problem has been unchanged. The problem is severe. The symptoms are relieved by beta-agonist inhalers. The symptoms are aggravated by nothing. Associated symptoms include chest pain (this morning, not now.) and shortness of breath. Pertinent negatives include no fever and no cough. Past medical history comments: COPD. Recently, medical care has been given at this facility (Hospitalized overnight 2 days ago for chest pain).    Past Medical History  Diagnosis Date  . Chest pain     previous CP thought r/t GI; mild CAD by cath 2002  . Cancer     of the eye  . Hypertension     pt denies h/o htn  . COPD (chronic obstructive pulmonary disease)   . Hyperlipidemia     taken off statin 1 mo ago by primary care bc he was "put on a new med that might interfere"  . Tobacco abuse   . 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 Gout     Past Surgical History  Procedure Date  . Shoulder surgery   . Neck surgery     Family History  Problem Relation Age of Onset  . Heart attack Mother 83  . Heart attack Brother 64    History  Substance Use Topics  . Smoking status: Smoker, Current Status Unknown -- 0.5 packs/day for 20  years    Types: Cigarettes  . Smokeless tobacco: Not on file  . Alcohol Use: No      Review of Systems  Constitutional: Negative for fever.  HENT: Negative for congestion, facial swelling and trouble swallowing.   Respiratory: Positive for shortness of breath. Negative for cough.   Cardiovascular: Positive for chest pain (this morning, not now.).  Gastrointestinal: Negative for nausea, vomiting, abdominal pain and diarrhea.  Genitourinary: Negative for difficulty urinating.  Skin: Negative for rash.  Neurological: Positive for headaches.  All other systems reviewed and are negative.    Allergies  Meperidine hcl  Home Medications   Current Outpatient Rx  Name Route Sig Dispense Refill  . ASPIRIN 325 MG PO TBEC Oral Take 1 tablet (325 mg total) by mouth daily. 30 tablet 0  . FEBUXOSTAT 40 MG PO TABS Oral Take 80 mg by mouth daily.    . FENTANYL 75 MCG/HR TD PT72 Transdermal Place 1 patch onto the skin every 3 (three) days.    Marland Kitchen FLUOXETINE HCL 20 MG PO CAPS Oral Take 20 mg by mouth daily.    Marland Kitchen LORAZEPAM 1 MG PO TABS Oral Take 1 mg by mouth every 8 (eight) hours.    Marland Kitchen MONTELUKAST SODIUM 10 MG PO TABS Oral Take 10 mg by mouth at bedtime.    . OLOPATADINE HCL 0.1 % OP SOLN  Both Eyes Place 1 drop into both eyes daily as needed. For allergy    . OXYCODONE HCL ER 40 MG PO TB12 Oral Take 40 mg by mouth every 12 (twelve) hours. For pain    . PANTOPRAZOLE SODIUM 40 MG PO TBEC Oral Take 40 mg by mouth daily.    Marland Kitchen PREDNISONE 10 MG PO TABS Oral Take 10 mg by mouth daily.      BP 118/54  Pulse 89  Temp(Src) 100.7 F (38.2 C) (Rectal)  Resp 15  SpO2 93%  Physical Exam  Nursing note and vitals reviewed. Constitutional: He is oriented to person, place, and time. He appears well-developed and well-nourished. No distress.  HENT:  Head: Normocephalic and atraumatic.  Mouth/Throat: Oropharynx is clear and moist.  Eyes: Conjunctivae are normal. Pupils are equal, round, and reactive to  light. No scleral icterus.  Neck: Normal range of motion. Neck supple.  Cardiovascular: Normal rate, regular rhythm, normal heart sounds and intact distal pulses.   No murmur heard. Pulmonary/Chest: No stridor. He is in respiratory distress (mild'). He has decreased breath sounds (decreased air movement in bilateral posterior lung fields). He has wheezes (frontal lung fiels). He has no rales.  Abdominal: Soft. He exhibits no distension. There is no tenderness.  Musculoskeletal: Normal range of motion. He exhibits no edema.       RUE mildly edematous compared to left.  No pitting.  Bruise in antecubital fossa.  Multiple tiny healing puncture wounds over radial artery on right wrist.  Neurological: He is alert and oriented to person, place, and time.  Skin: Skin is warm and dry. No rash noted.  Psychiatric: He has a normal mood and affect. His behavior is normal.    ED Course  Procedures (including critical care time)  Labs Reviewed  CBC - Abnormal; Notable for the following:    RBC 3.92 (*)    Hemoglobin 12.5 (*)    HCT 35.8 (*)    All other components within normal limits  DIFFERENTIAL - Abnormal; Notable for the following:    Neutrophils Relative 80 (*)    All other components within normal limits  COMPREHENSIVE METABOLIC PANEL - Abnormal; Notable for the following:    Sodium 132 (*)    Chloride 94 (*)    Glucose, Bld 111 (*)    AST 57 (*)    All other components within normal limits  TROPONIN I  URINALYSIS, ROUTINE W REFLEX MICROSCOPIC  URINE RAPID DRUG SCREEN (HOSP PERFORMED)  BLOOD GAS, ARTERIAL   Dg Chest 2 View  01/27/2012  *RADIOLOGY REPORT*  Clinical Data: Respiratory distress  CHEST - 2 VIEW  Comparison: 01/23/2012  Findings: The lungs are hyperinflated but clear.  Heart size and mediastinal contours appear normal.  No pleural effusion or edema.  No airspace consolidation.  IMPRESSION:  1.  No active cardiopulmonary abnormalities.  Original Report Authenticated By:  Rosealee Albee, M.D.   Ct Head Wo Contrast  01/27/2012  *RADIOLOGY REPORT*  Clinical Data: Dizziness with nausea vomiting.  History of low blood pressure.  CT HEAD WITHOUT CONTRAST  Technique:  Contiguous axial images were obtained from the base of the skull through the vertex without contrast.  Comparison: 06/08/2010  Findings: Bone windows demonstrate mucosal thickening of the right sphenoid sinus is new. Clear mastoid air cells.  Soft tissue windows demonstrate no  mass lesion, hemorrhage, hydrocephalus, acute infarct, intra-axial, or extra-axial fluid collection.  IMPRESSION:  1. No acute intracranial abnormality. 2.  Sinus disease.  Original Report Authenticated By: Consuello Bossier, M.D.   All radiology studies independently viewed by me.      Date: 01/27/2012  Rate: 107  Rhythm: normal sinus rhythm  QRS Axis: normal  Intervals: normal  ST/T Wave abnormalities: nonspecific T wave changes  Conduction Disutrbances:none  Narrative Interpretation:   Old EKG Reviewed: unchanged    1. COPD exacerbation       MDM  61 yo male w COPD with respiratory distress, headache, right arm pain/swelling, and amnesia to the events of yesterday.  Mildly tachycardic, but on albuterol neb.  Low grade temp.  Otherwise HDS.   Appears to be COPD exacerbation, with some atypical components.  Will check labs and CXR.  Give solumedrol and albuterol.  Head CT given amnesia and headache.    Labwork notable for mild hyponatremia.  Otherwise unremarkable.  CXR negative.  RT unable to obtain ABG.  Pt is AOx3 and does not appear to be hypercarbic at present.  CT head negative.  Consult internal medicine for admission.  At present, pt feels much better, is breathing more comfortably.  Pt may require RUE dopplers while inpatient, but COPD much more likely than PE.    Admitted to internal medicine        Warnell Forester, MD 01/27/12 1409

## 2012-01-27 NOTE — ED Provider Notes (Signed)
Pt with complaints of shortness of breath.  History of COPD.  Also mentions that he cannot recall the events of yesterday and has a headache now.  On exam, wheezing with diminished breath sounds.  Alert and oriented without focal neuro deficits.  Will plan on evaluation for his dyspnea, likely COPD and purse evaluation of the amnesia yesterday including a head CT.  Celene Kras, MD 01/27/12 [phone]

## 2012-01-28 LAB — HEMOGLOBIN A1C
Hgb A1c MFr Bld: 5.7 % — ABNORMAL HIGH (ref <5.7)
Mean Plasma Glucose: 117 mg/dL — ABNORMAL HIGH (ref <117)

## 2012-01-28 MED ORDER — DIPHENHYDRAMINE HCL 25 MG PO CAPS
25.0000 mg | ORAL_CAPSULE | Freq: Three times a day (TID) | ORAL | Status: DC | PRN
  Administered 2012-01-28: 25 mg via ORAL
  Filled 2012-01-28: qty 1

## 2012-01-28 MED ORDER — PREDNISONE 50 MG PO TABS: ORAL_TABLET | ORAL | Status: AC

## 2012-01-28 MED ORDER — ALBUTEROL 90 MCG/ACT IN AERS: 2.0000 | INHALATION_SPRAY | Freq: Four times a day (QID) | RESPIRATORY_TRACT | Status: DC | PRN

## 2012-01-28 MED ORDER — TIOTROPIUM BROMIDE MONOHYDRATE 18 MCG IN CAPS: 18.0000 ug | ORAL_CAPSULE | Freq: Every day | RESPIRATORY_TRACT | Status: DC

## 2012-01-28 MED ORDER — PREDNISONE 10 MG PO TABS: 10.0000 mg | ORAL_TABLET | Freq: Every day | ORAL | Status: DC

## 2012-01-28 MED ORDER — BUDESONIDE-FORMOTEROL FUMARATE 160-4.5 MCG/ACT IN AERO: 2.0000 | INHALATION_SPRAY | Freq: Two times a day (BID) | RESPIRATORY_TRACT | Status: DC

## 2012-01-28 NOTE — Discharge Summary (Signed)
Patient ID: Nicholas Soto MRN: [id number] DOB/AGE: 1951-04-14 61 y.o.  Admit date: 01/27/2012 Discharge date: 01/28/2012  Primary Care Physician:  Aura Dials, MD, MD  Discharge Diagnoses:    Principal Problem:  *COPD exacerbation  Active Problems:  TOBACCO ABUSE  Other chronic pain  Hyperlipidemia     Medication List  As of 01/28/2012 10:49 AM   TAKE these medications         albuterol (2.5 MG/3ML) 0.083% nebulizer solution   Commonly known as: PROVENTIL   Take 2.5 mg by nebulization every 6 (six) hours as needed. For shortness of breath      albuterol 90 MCG/ACT inhaler   Commonly known as: PROVENTIL,VENTOLIN   Inhale 2 puffs into the lungs every 6 (six) hours as needed for wheezing.      aspirin 81 MG chewable tablet   Chew 162 mg by mouth 2 (two) times daily.      budesonide-formoterol 160-4.5 MCG/ACT inhaler   Commonly known as: SYMBICORT   Inhale 2 puffs into the lungs 2 (two) times daily.      colchicine 0.6 MG tablet   Take 0.6 mg by mouth daily as needed. For gout      febuxostat 40 MG tablet   Commonly known as: ULORIC   Take 80 mg by mouth daily as needed. For gout      fentaNYL 75 MCG/HR   Commonly known as: DURAGESIC - dosed mcg/hr   Place 1 patch onto the skin every 3 (three) days.      FLUoxetine 20 MG capsule   Commonly known as: PROZAC   Take 20 mg by mouth daily.      ipratropium-albuterol 0.5-2.5 (3) MG/3ML Soln   Commonly known as: DUONEB   Take 3 mLs by nebulization daily as needed. For when he has trouble breathing      LORazepam 1 MG tablet   Commonly known as: ATIVAN   Take 1 mg by mouth at bedtime as needed. For anxiety & sleep      montelukast 10 MG tablet   Commonly known as: SINGULAIR   Take 10 mg by mouth daily.      olopatadine 0.1 % ophthalmic solution   Commonly known as: PATANOL   Place 1 drop into both eyes daily as needed. For allergy      oxyCODONE 40 MG 12 hr tablet   Commonly known as: OXYCONTIN   Take  40 mg by mouth every 12 (twelve) hours. For pain      pantoprazole 40 MG tablet   Commonly known as: PROTONIX   Take 40 mg by mouth daily.      predniSONE 50 MG tablet   Commonly known as: DELTASONE   1 tablet daily for 5 days      predniSONE 10 MG tablet   Commonly known as: DELTASONE   Take 1 tablet (10 mg total) by mouth daily.   Start taking on: 02/02/2012      tiotropium 18 MCG inhalation capsule   Commonly known as: SPIRIVA   Place 1 capsule (18 mcg total) into inhaler and inhale daily.            Disposition and Follow-up: Home with f/up with PCP in 1 week  Consults:  none  Significant Diagnostic Studies:  Dg Chest 2 View  01/27/2012  *RADIOLOGY REPORT*  Clinical Data: Respiratory distress  CHEST - 2 VIEW  Comparison: 01/23/2012  Findings: The lungs are hyperinflated but clear.  Heart size and  mediastinal contours appear normal.  No pleural effusion or edema.  No airspace consolidation.  IMPRESSION:  1.  No active cardiopulmonary abnormalities.  Original Report Authenticated By: Rosealee Albee, M.D.   Ct Head Wo Contrast  01/27/2012  *RADIOLOGY REPORT*  Clinical Data: Dizziness with nausea vomiting.  History of low blood pressure.  CT HEAD WITHOUT CONTRAST  Technique:  Contiguous axial images were obtained from the base of the skull through the vertex without contrast.  Comparison: 06/08/2010  Findings: Bone windows demonstrate mucosal thickening of the right sphenoid sinus is new. Clear mastoid air cells.  Soft tissue windows demonstrate no  mass lesion, hemorrhage, hydrocephalus, acute infarct, intra-axial, or extra-axial fluid collection.  IMPRESSION:  1. No acute intracranial abnormality. 2.  Sinus disease.  Original Report Authenticated By: Consuello Bossier, M.D.    Brief H and P:60 y/o male with hx of COPD, active smoking, chronic back pain, HL and psoriatic arthritis was recently admitted to the hospital for atypical chest pain and discharged on 1/27 came to the ED  with shortness of breath with wheezing since this morning. He denies any cough or headache but is have subjective fevers since yesterday and felt diaphoretic this morning. He also had some chest discomfort which was similar to his chest pain few days ago when he was admitted. His chest discomfort lasted for few minutes and was resolved by the time EMS arrived. EMS noted that he was was note to be desating in high 80s.he was given a breathing treatment in ED after which his dyspnea improved he ws also noted to have a temp of 100.32F. Patient planned for admitting to medical floor for COPD exacerbation. He also informs that he does not have much recollections of the events from yesterday. As per wife he was Up and moving in and around the house but patient does not recall about it much. He had a Head CT done in ED which was unremarkable.     For complete details please refer to admission H and P, but in brief   Physical Exam on Discharge:  Filed Vitals:   01/27/12 2145 01/28/12 0132 01/28/12 0536 01/28/12 0738  BP: 121/68  127/60   Pulse: 72  69   Temp: 96.9 F (36.1 C)  96.7 F (35.9 C)   TempSrc: Oral  Oral   Resp: 20  20   Height:      Weight:      SpO2: 94% 97% 97% 95%    No intake or output data in the 24 hours ending 01/28/12 1049  General: Alert, awake, oriented x3, in no acute distress. HEENT: no pallor, moist oral mucosa Heart: Regular rate and rhythm, without murmurs, rubs, gallops. Lungs: Clear to auscultation bilaterally. Abdomen: Soft, nontender, nondistended, positive bowel sounds. Extremities: No clubbing cyanosis or edema with positive pedal pulses. Neuro: Grossly intact, nonfocal.  CBC:    Component Value Date/Time   WBC 7.6 01/27/2012 0909   HGB 12.5* 01/27/2012 0909   HCT 35.8* 01/27/2012 0909   PLT 178 01/27/2012 0909   MCV 91.3 01/27/2012 0909   NEUTROABS 6.1 01/27/2012 0909   LYMPHSABS 0.9 01/27/2012 0909   MONOABS 0.5 01/27/2012 0909   EOSABS 0.0 01/27/2012 0909     BASOSABS 0.0 01/27/2012 0909    Basic Metabolic Panel:    Component Value Date/Time   NA 132* 01/27/2012 0909   K 3.6 01/27/2012 0909   CL 94* 01/27/2012 0909   CO2 27 01/27/2012 0909   BUN  13 01/27/2012 0909   CREATININE 0.84 01/27/2012 0909   GLUCOSE 111* 01/27/2012 0909   CALCIUM 8.7 01/27/2012 0909    Hospital Course:   *Acute COPD exacerbation  Patient given IV solumedrol and nebs in the ED with improvement  He was  admitted for COPD exacerbation. Given IV solumedrol 60 mg q 8hrs  continued albuterol and Atrovent nebs  Patient has had PFTs done in past and follws with Dr Craige Cotta  He has been counseled on smoking cessation in past and repeated during hospital stay and also provided with 1800 quit smoking number.   02 sat wnl He was on symbicort as outpatient which should be continued on dsicharge  He will be discharged on po prednisone for 5 days. Given scripts for albuterol puffs, spiriva and symbicort. He has nebs at home.  confusion on 1 day prior to admission no clear cause  head CT unremarkable  UA unremarkable except for glucosuria. i have added a HbA1C and should be followed as outpatient  He is clearly oriented during hospital stay and informs using oxycontin for back pain occasionally.  Tobacco abuse  counseled on smoking cessation but he does not want to quit due to ill effect to his health and abnormal weight gains on quitting before .   Hypotension  patient on chronic low dose prednisone for hx of low BP in past. He will be discharged on po prednisone 50 mg daily for 5 days and then can resume his home dose of prednisone 10 mg daily  Patient stable for discharge home with outpt follow up.    Time spent on Discharge: 45 minutes  Signed: Eddie North 01/28/2012, 10:49 AM

## 2012-01-29 NOTE — ED Provider Notes (Signed)
I saw and evaluated the patient, reviewed the resident's note and I agree with the findings and plan.   Braylei Totino R Jakoby Melendrez, MD 01/29/12 0716 

## 2012-01-31 ENCOUNTER — Other Ambulatory Visit (HOSPITAL_COMMUNITY): Payer: Self-pay | Admitting: Cardiovascular Disease

## 2012-01-31 DIAGNOSIS — R079 Chest pain, unspecified: Secondary | ICD-10-CM

## 2012-02-01 ENCOUNTER — Ambulatory Visit (HOSPITAL_COMMUNITY): Payer: 59 | Admitting: Radiology

## 2012-02-02 ENCOUNTER — Other Ambulatory Visit (HOSPITAL_COMMUNITY): Payer: 59 | Admitting: Radiology

## 2012-02-03 ENCOUNTER — Ambulatory Visit (HOSPITAL_COMMUNITY): Payer: 59 | Attending: Cardiovascular Disease | Admitting: Radiology

## 2012-02-03 ENCOUNTER — Encounter (HOSPITAL_COMMUNITY): Payer: Self-pay | Admitting: Radiology

## 2012-02-03 VITALS — BP 131/63 | Ht 68.0 in | Wt 182.0 lb

## 2012-02-03 DIAGNOSIS — R42 Dizziness and giddiness: Secondary | ICD-10-CM | POA: Insufficient documentation

## 2012-02-03 DIAGNOSIS — I2510 Atherosclerotic heart disease of native coronary artery without angina pectoris: Secondary | ICD-10-CM

## 2012-02-03 DIAGNOSIS — R61 Generalized hyperhidrosis: Secondary | ICD-10-CM | POA: Insufficient documentation

## 2012-02-03 DIAGNOSIS — R002 Palpitations: Secondary | ICD-10-CM | POA: Insufficient documentation

## 2012-02-03 DIAGNOSIS — F17200 Nicotine dependence, unspecified, uncomplicated: Secondary | ICD-10-CM | POA: Insufficient documentation

## 2012-02-03 DIAGNOSIS — R0602 Shortness of breath: Secondary | ICD-10-CM

## 2012-02-03 DIAGNOSIS — R112 Nausea with vomiting, unspecified: Secondary | ICD-10-CM | POA: Insufficient documentation

## 2012-02-03 DIAGNOSIS — Z8249 Family history of ischemic heart disease and other diseases of the circulatory system: Secondary | ICD-10-CM | POA: Insufficient documentation

## 2012-02-03 DIAGNOSIS — R079 Chest pain, unspecified: Secondary | ICD-10-CM | POA: Insufficient documentation

## 2012-02-03 DIAGNOSIS — E785 Hyperlipidemia, unspecified: Secondary | ICD-10-CM | POA: Insufficient documentation

## 2012-02-03 DIAGNOSIS — I10 Essential (primary) hypertension: Secondary | ICD-10-CM | POA: Insufficient documentation

## 2012-02-03 MED ORDER — REGADENOSON 0.4 MG/5ML IV SOLN
0.4000 mg | Freq: Once | INTRAVENOUS | Status: AC
  Administered 2012-02-03: 0.4 mg via INTRAVENOUS

## 2012-02-03 MED ORDER — TECHNETIUM TC 99M TETROFOSMIN IV KIT
33.0000 | PACK | Freq: Once | INTRAVENOUS | Status: AC | PRN
  Administered 2012-02-03: 33 via INTRAVENOUS

## 2012-02-03 MED ORDER — TECHNETIUM TC 99M TETROFOSMIN IV KIT
11.0000 | PACK | Freq: Once | INTRAVENOUS | Status: AC | PRN
  Administered 2012-02-03: 11 via INTRAVENOUS

## 2012-02-03 NOTE — Progress Notes (Signed)
[facility] SITE 3 NUCLEAR MED [address] [place] [address] [phone]  Cardiology Nuclear Med Study  Nicholas Soto is a 61 y.o. male [id number] 1951-01-24   Nuclear Med Background Indication for Stress Test:  Evaluation for Ischemia and Post Hospita-l1/13 CP, Neg. Enzymes History: 2002 Heart Catheterization- EF 55%, mild CAD and 2008 Myocardial Perfusion Study- EF 73%, 2011 Echo- EF 55-60% mild LVH, Cardiac Risk Factors: Family History - CAD, Hypertension, Lipids and Smoker  Symptoms:  Chest Pain, Diaphoresis, Dizziness, Nausea, Palpitations, SOB and Vomiting   Nuclear Pre-Procedure Caffeine/Decaff Intake:  None NPO After: 5pm   Lungs:  Clear IV 0.9% NS with Angio Cath:  20g  IV Site: R Antecubital  IV Started by:  Bonnita Levan, RN  Chest Size (in):  46 Cup Size: n/a  Height: 5\' 8"  (1.727 m) Weight:  182 lb (82.555 kg)  BMI:  Body mass index is 27.67 kg/(m^2). Tech Comments:  N/A    Nuclear Med Study 1 or 2 day study: 1 day  Stress Test Type:  Lexiscan  Reading MD: Charlton Haws, MD  Order Authorizing Provider:  Kristeen Miss, MD  Resting Radionuclide: Technetium 19m Tetrofosmin  Resting Radionuclide Dose: 11 mCi   Stress Radionuclide:  Technetium 16m Tetrofosmin  Stress Radionuclide Dose: 33 mCi           Stress Protocol Rest HR: 67 Stress HR: 90  Rest BP: 131/63 Stress BP: 127/62  Exercise Time (min): n/a METS: n/a   Predicted Max HR: 159 bpm % Max HR: 56.6 bpm Rate Pressure Product: 21308   Dose of Adenosine (mg):  n/a Dose of Lexiscan: 0.4 mg  Dose of Atropine (mg): n/a Dose of Dobutamine: n/a mcg/kg/min (at max HR)  Stress Test Technologist: Bonnita Levan, RN  Nuclear Technologist:  Doyne Keel, CNMT     Rest Procedure:  Myocardial perfusion imaging was performed at rest 45 minutes following the intravenous administration of Technetium 67m Tetrofosmin. Rest ECG: NSR  Stress Procedure:  The patient received IV Lexiscan 0.4 mg  over 15-seconds.  Technetium 97m Tetrofosmin injected at 30-seconds.  There were no significant changes with Lexiscan.  Quantitative spect images were obtained after a 45 minute delay. Stress ECG: No significant change from baseline ECG  QPS Raw Data Images:  Patient motion noted. Stress Images:  Apical thinning Rest Images:  Apical thinning Subtraction (SDS):  SDS 6 abnormal in apex/septum Transient Ischemic Dilatation (Normal <1.22):  1.03 Lung/Heart Ratio (Normal <0.45):  .27  Quantitative Gated Spect Images QGS EDV:  90 ml QGS ESV:  34 ml QGS cine images:  NL LV Function; NL Wall Motion QGS EF: 63%  Impression Exercise Capacity:  Lexiscan with no exercise. BP Response:  Normal blood pressure response. Clinical Symptoms:  There is dyspnea. ECG Impression:  No significant ST segment change suggestive of ischemia. Comparison with Prior Nuclear Study: No images to compare  Overall Impression:  Low risk study.  Apical thinning.  SDS 6 with suggestion of mild ischemia in the inferobase and septum  EF normal 63%  Charlton Haws

## 2012-02-08 ENCOUNTER — Telehealth: Payer: Self-pay | Admitting: *Deleted

## 2012-02-08 NOTE — Telephone Encounter (Signed)
Spoke with pt, he said he will call back and set app to f/u for stress test review.

## 2012-02-08 NOTE — Telephone Encounter (Signed)
Message copied by Antony Odea on Tue Feb 08, 2012  2:09 PM ------      Message from: [place], [place]      Created: Fri Feb 04, 2012  7:01 PM       I agree that this represents a low risk study.  There is significant uptake in the bowel that affects the uptake in the LV.   I would read this a apical thinng with a very small amount of ischemia.  The apex contracts normally.              He should follow up with [place] ( in [place] if that is where he would like - he has seen our partners up there or in [place] )            He needs to quit smoking.

## 2012-02-15 ENCOUNTER — Other Ambulatory Visit: Payer: Self-pay | Admitting: Physician Assistant

## 2012-03-08 ENCOUNTER — Other Ambulatory Visit: Payer: Self-pay

## 2012-03-08 ENCOUNTER — Emergency Department (HOSPITAL_COMMUNITY): Payer: 59

## 2012-03-08 ENCOUNTER — Ambulatory Visit (INDEPENDENT_AMBULATORY_CARE_PROVIDER_SITE_OTHER): Payer: 59 | Admitting: Family Medicine

## 2012-03-08 ENCOUNTER — Emergency Department (HOSPITAL_COMMUNITY)
Admission: EM | Admit: 2012-03-08 | Discharge: 2012-03-08 | Discharge status: Against Medical Advice | Payer: 59 | Attending: Emergency Medicine | Admitting: Emergency Medicine

## 2012-03-08 ENCOUNTER — Encounter: Payer: Self-pay | Admitting: Physician Assistant

## 2012-03-08 ENCOUNTER — Encounter (HOSPITAL_COMMUNITY): Payer: Self-pay | Admitting: Emergency Medicine

## 2012-03-08 DIAGNOSIS — R079 Chest pain, unspecified: Secondary | ICD-10-CM

## 2012-03-08 DIAGNOSIS — R062 Wheezing: Secondary | ICD-10-CM | POA: Insufficient documentation

## 2012-03-08 DIAGNOSIS — Z8639 Personal history of other endocrine, nutritional and metabolic disease: Secondary | ICD-10-CM | POA: Insufficient documentation

## 2012-03-08 DIAGNOSIS — R112 Nausea with vomiting, unspecified: Secondary | ICD-10-CM

## 2012-03-08 DIAGNOSIS — K8020 Calculus of gallbladder without cholecystitis without obstruction: Secondary | ICD-10-CM | POA: Insufficient documentation

## 2012-03-08 DIAGNOSIS — R61 Generalized hyperhidrosis: Secondary | ICD-10-CM

## 2012-03-08 DIAGNOSIS — J449 Chronic obstructive pulmonary disease, unspecified: Secondary | ICD-10-CM | POA: Insufficient documentation

## 2012-03-08 DIAGNOSIS — Z7982 Long term (current) use of aspirin: Secondary | ICD-10-CM | POA: Insufficient documentation

## 2012-03-08 DIAGNOSIS — E785 Hyperlipidemia, unspecified: Secondary | ICD-10-CM | POA: Insufficient documentation

## 2012-03-08 DIAGNOSIS — J4489 Other specified chronic obstructive pulmonary disease: Secondary | ICD-10-CM | POA: Insufficient documentation

## 2012-03-08 DIAGNOSIS — Z79899 Other long term (current) drug therapy: Secondary | ICD-10-CM | POA: Insufficient documentation

## 2012-03-08 DIAGNOSIS — R1013 Epigastric pain: Secondary | ICD-10-CM | POA: Insufficient documentation

## 2012-03-08 DIAGNOSIS — I10 Essential (primary) hypertension: Secondary | ICD-10-CM | POA: Insufficient documentation

## 2012-03-08 DIAGNOSIS — G5790 Unspecified mononeuropathy of unspecified lower limb: Secondary | ICD-10-CM | POA: Insufficient documentation

## 2012-03-08 DIAGNOSIS — Z862 Personal history of diseases of the blood and blood-forming organs and certain disorders involving the immune mechanism: Secondary | ICD-10-CM | POA: Insufficient documentation

## 2012-03-08 DIAGNOSIS — Z85840 Personal history of malignant neoplasm of eye: Secondary | ICD-10-CM | POA: Insufficient documentation

## 2012-03-08 LAB — COMPREHENSIVE METABOLIC PANEL
ALT: 82 U/L — ABNORMAL HIGH (ref 0–53)
Alkaline Phosphatase: 119 U/L — ABNORMAL HIGH (ref 39–117)
CO2: 25 mEq/L (ref 19–32)
Calcium: 9.4 mg/dL (ref 8.4–10.5)
GFR calc Af Amer: >90 mL/min (ref >90)
GFR calc non Af Amer: >90 mL/min (ref >90)
Glucose, Bld: 139 mg/dL — ABNORMAL HIGH (ref 70–99)
Potassium: 3.8 mEq/L (ref 3.5–5.1)
Sodium: 140 mEq/L (ref 135–145)

## 2012-03-08 LAB — CBC
Hemoglobin: 14.3 g/dL (ref 13.0–17.0)
MCH: 31.2 pg (ref 26.0–34.0)
RBC: 4.58 MIL/uL (ref 4.22–5.81)

## 2012-03-08 MED ORDER — ASPIRIN 81 MG PO CHEW
324.0000 mg | CHEWABLE_TABLET | Freq: Once | ORAL | Status: AC
  Administered 2012-03-08: 324 mg via ORAL

## 2012-03-08 MED ORDER — MORPHINE SULFATE 4 MG/ML IJ SOLN
6.0000 mg | Freq: Once | INTRAMUSCULAR | Status: AC
  Administered 2012-03-08: 6 mg via INTRAVENOUS
  Filled 2012-03-08: qty 2

## 2012-03-08 MED ORDER — ONDANSETRON HCL 4 MG/2ML IJ SOLN
4.0000 mg | Freq: Once | INTRAMUSCULAR | Status: AC
  Administered 2012-03-08: 4 mg via INTRAVENOUS
  Filled 2012-03-08: qty 2

## 2012-03-08 MED ORDER — MORPHINE SULFATE 4 MG/ML IJ SOLN
8.0000 mg | Freq: Once | INTRAMUSCULAR | Status: AC
  Administered 2012-03-08: 8 mg via INTRAVENOUS
  Filled 2012-03-08 (×2): qty 1

## 2012-03-08 MED ORDER — NITROGLYCERIN 0.3 MG SL SUBL
0.4000 mg | SUBLINGUAL_TABLET | SUBLINGUAL | Status: AC | PRN
  Administered 2012-03-08 (×2): 0.3 mg via SUBLINGUAL

## 2012-03-08 NOTE — ED Notes (Signed)
Per EMS given additional nitro SL.  Total of 3 nitro SL prior to arrival and 324mg  aspirin.

## 2012-03-08 NOTE — ED Provider Notes (Addendum)
Medical screening examination/treatment/procedure(s) were conducted as a shared visit with non-physician practitioner(s) and myself.  I personally evaluated the patient during the encounter  Patient will initially with multiple complaints of pain.  Ultimately we were able to better localize to the patient had epigastric and right upper quadrant pain with associated nausea and vomiting.  No fevers.  He notes some similar episodes in the past but previously they have been attributed to an ulcer.  Patient had sudden onset today with nausea and vomiting.  He has mild tenderness on his exam.  I do have concern for possible early cholecystitis given the patient has an elevated white blood cell count, elevated LFTs and gallstones present on his ultrasound.  I have discussed this with Dr. Corliss Skains the surgeon on call and he will evaluate the patient when he completes the case shortly.  The patient is aware that he is to stay n.p.o. until further evaluation by surgery.  Nat Christen, MD 03/08/12 2029  Patient seen by Dr. Corliss Skains  from surgery and ultimately chose to leave AGAINST MEDICAL ADVICE after further discussion with him on further management.  Nat Christen, MD 03/09/12 0020  Nat Christen, MD 03/10/12 [phone]

## 2012-03-08 NOTE — ED Notes (Signed)
Family at bedside. 

## 2012-03-08 NOTE — Progress Notes (Signed)
  Patient Name: Nicholas Soto Date of Birth: 07/25/1951 Medical Record Number: [id number] Gender: male Date of Encounter: 03/08/2012  History of Present Illness:  Nicholas Soto is a 61 y.o. very pleasant male patient who presents with the following:  Brought back urgently with about an hour of severe substernal CP.  Per his wife he has a history of chest pain and has had a stress test per [facility] recently.  He was unable to give [place] much more information due to his distress.    Patient Active Problem List  Diagnoses  . TOBACCO ABUSE  . Other chronic pain  . C O P D  . PULMONARY NODULE  . PSORIATIC ARTHRITIS  . Chest pain  . Hyperlipidemia  . COPD exacerbation   Past Medical History  Diagnosis Date  . Chest pain     previous CP thought r/t GI; mild CAD by cath 2002  . Cancer     of the eye  . Hypertension     pt denies h/o htn  . COPD (chronic obstructive pulmonary disease)   . Hyperlipidemia     taken off statin 1 mo ago by primary care bc he was "put on a new med that might interfere"  . Tobacco abuse   . 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 Gout   . Shortness of breath    Past Surgical History  Procedure Date  . Shoulder surgery   . Neck surgery   . Eye surgery     cancer  . Spine surgery   . Knee cartilage surgery    History  Substance Use Topics  . Smoking status: Smoker, Current Status Unknown -- 0.5 packs/day for 20 years    Types: Cigarettes  . Smokeless tobacco: Never Used  . Alcohol Use: No   Family History  Problem Relation Age of Onset  . Heart attack Mother 15  . Heart attack Brother 62   Allergies  Allergen Reactions  . Meperidine Hcl Nausea And Vomiting    unknown    Medication list has been reviewed and updated.  Review of Systems: As per HPI- otherwise negative. He denies any use of viagra or other such medications, and had not taken aspirin so far today  Physical Examination: Filed Vitals:   03/08/12 1524  SpO2: 96%    There is no height or weight on file to calculate BMI.  Distressed adult male, clutching his left chest.  Tobacco odor CV: regular rate and rhythm, no M/R/G Pulm: good air movement bilaterally No skin lesion Alert and able to answer questions but in pain, sitting in wheelchair   See scanned emergency care sheet for further details   Assessment and Plan: Acute chest pain concerning for AMI.  Treated immediately with ASA 81mg  X4, and then nitro.4mg  times 2 doses once BP determined to be sufficient.  Also started on 2L of nasal canula O2, IV started.  EMS called immediately upon pt's arrival in triage.  Appreciate care of ED and cardiology teams.

## 2012-03-08 NOTE — ED Notes (Signed)
Seen at urgent care for chest pain.  Given nitro 2 SL tablets. And 324mg  aspirin.  EMS reported patient took out iv establised by urgent care and took own medication 80mg  oxycotin.  Upon arrival patient moaning and states pain 10/10 sharp center of chest. Patient had one emesis.  Airway intact bilateral equal chest rise and fall.

## 2012-03-08 NOTE — ED Notes (Signed)
Returned from ultrasound.

## 2012-03-08 NOTE — ED Provider Notes (Signed)
History     CSN: [id number]  Arrival date & time 03/08/12  1535   First MD Initiated Contact with Patient 03/08/12 1553      Chief Complaint  Patient presents with  . Chest Pain    (Consider location/radiation/quality/duration/timing/severity/associated sxs/prior treatment) HPI Comments: Patient with a history of hyperlipidemia, tobacco abuse, and COPD presents emergency Department with a chief complaint of chest pain.  Patient was sent from urgent care after receiving 2 sublingual nitroglycerin and ASA 324 mg.  EMS reports that patient is currently on fentanyl patch for bilateral neuropathy of lower extremities in that he took out his established IV in route as well as taking 80 mg OxyContin of his own medication.  Patient on arrival to the ED is moaning and complaining of severe epigastric 10 out of 10 sharp chest pain.  The pain is also located in his right upper quadrant and left upper quadrant.  He denies jaw pain or radiation to arm.  Patient states he has had one emesis episode.  Patient is currently being followed by [facility].  He had a full cardiac evaluation back in November 2011 that showed mild left ventricular hypertrophy, normal ejection fraction, and grade 2 diastolic dysfunction.  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  Patient is a 61 y.o. male presenting with chest pain. The history is provided by the patient.  Chest Pain Primary symptoms include nausea and vomiting. Pertinent negatives for primary symptoms include no fever, no shortness of breath, no abdominal pain and no dizziness.  Pertinent negatives for associated symptoms include no numbness and no weakness.     Past Medical History  Diagnosis Date  . Chest pain     previous CP thought r/t GI; mild CAD by cath 2002  . Cancer     of the eye  . Hypertension     pt denies h/o htn  . COPD (chronic obstructive pulmonary disease)   . Hyperlipidemia     taken off statin 1  mo ago by primary care bc he was "put on a new med that might interfere"  . Tobacco abuse   . 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 Gout   . Shortness of breath     Past Surgical History  Procedure Date  . Shoulder surgery   . Neck surgery   . Eye surgery     cancer  . Spine surgery   . Knee cartilage surgery     Family History  Problem Relation Age of Onset  . Heart attack Mother 23  . Heart attack Brother 46    History  Substance Use Topics  . Smoking status: Smoker, Current Status Unknown -- 0.5 packs/day for 20 years    Types: Cigarettes  . Smokeless tobacco: Never Used  . Alcohol Use: No      Review of Systems  Constitutional: Negative for fever, chills and appetite change.  HENT: Negative for congestion, sore throat, trouble swallowing, neck pain and neck stiffness.   Eyes: Negative for visual disturbance.  Respiratory: Negative for shortness of breath.   Cardiovascular: Positive for chest pain. Negative for leg swelling.  Gastrointestinal: Positive for nausea and vomiting. Negative for abdominal pain, diarrhea, constipation, blood in stool, abdominal distention, anal bleeding and rectal pain.  Genitourinary: Negative for dysuria, urgency and frequency.  Musculoskeletal:       Chronic bilateral neuropathy of feet  Neurological: Negative for dizziness, syncope,  weakness, light-headedness, numbness and headaches.  Psychiatric/Behavioral: Negative for confusion.    Allergies  Meperidine hcl  Home Medications   Current Outpatient Rx  Name Route Sig Dispense Refill  . ALBUTEROL SULFATE (2.5 MG/3ML) 0.083% IN NEBU Nebulization Take 2.5 mg by nebulization every 6 (six) hours as needed. For shortness of breath    . ALBUTEROL 90 MCG/ACT IN AERS Inhalation Inhale 2 puffs into the lungs every 6 (six) hours as needed. For shortness of breath    . ASPIRIN 81 MG PO CHEW Oral Chew 162 mg by mouth 2 (two) times daily.    . BUDESONIDE-FORMOTEROL  FUMARATE 160-4.5 MCG/ACT IN AERO Inhalation Inhale 2 puffs into the lungs 2 (two) times daily.    Marland Kitchen DIPHENHYDRAMINE HCL (SLEEP) 25 MG PO TABS Oral Take 25 mg by mouth at bedtime as needed. For insomnia    . FENTANYL 75 MCG/HR TD PT72 Transdermal Place 1 patch onto the skin every 3 (three) days.    . IPRATROPIUM-ALBUTEROL 0.5-2.5 (3) MG/3ML IN SOLN Nebulization Take 3 mLs by nebulization daily as needed. For when he has trouble breathing    . LORAZEPAM 1 MG PO TABS Oral Take 1 mg by mouth at bedtime as needed. For anxiety & sleep    . MONTELUKAST SODIUM 10 MG PO TABS Oral Take 10 mg by mouth daily.     . OLOPATADINE HCL 0.1 % OP SOLN Both Eyes Place 1 drop into both eyes daily as needed. For allergy    . OXYCODONE HCL ER 40 MG PO TB12 Oral Take 40 mg by mouth every 12 (twelve) hours. For pain    . PANTOPRAZOLE SODIUM 40 MG PO TBEC Oral Take 40 mg by mouth daily.    Marland Kitchen PREDNISONE 10 MG PO TABS Oral Take 10 mg by mouth daily.    Marland Kitchen TIOTROPIUM BROMIDE MONOHYDRATE 18 MCG IN CAPS Inhalation Place 18 mcg into inhaler and inhale daily.    . COLCHICINE 0.6 MG PO TABS Oral Take 0.6 mg by mouth daily as needed. For gout    . FEBUXOSTAT 40 MG PO TABS Oral Take 80 mg by mouth daily as needed. For gout      BP 158/77  Pulse 70  Temp(Src) 98.3 F (36.8 C) (Oral)  Resp 23  Ht 5\' 9"  (1.753 m)  Wt 180 lb (81.647 kg)  BMI 26.58 kg/m2  SpO2 100%  Physical Exam  Nursing note and vitals reviewed. Constitutional: He appears well-developed and well-nourished. No distress.  HENT:  Head: Normocephalic and atraumatic.       Poor dentition.  Eyes: Conjunctivae and EOM are normal. Pupils are equal, round, and reactive to light.  Neck: Normal range of motion. Neck supple. Normal carotid pulses and no JVD present. Carotid bruit is not present. No rigidity. Normal range of motion present.       Supple.  Negative for carotid bruits.  JVD not elevated.  Cardiovascular: Normal rate, regular rhythm, S1 normal, S2  normal, normal heart sounds, intact distal pulses and normal pulses.  Exam reveals no gallop and no friction rub.   No murmur heard.      No pitting edema bilaterally, RRR, no aberrant sounds on auscultations, distal pulses intact, no carotid bruit or JVD.   Pulmonary/Chest: Effort normal and breath sounds normal. No accessory muscle usage or stridor. No respiratory distress. He exhibits no tenderness and no bony tenderness.       Diminished breath and lungs bilaterally throughout.  Slight expiratory wheezing on  forced expiration.  No accessory muscle use or nasal flare.  No acute respiratory distress.  Patient able to speak full sentences.  Abdominal: Bowel sounds are normal. He exhibits no abdominal bruit and no pulsatile midline mass. There is tenderness. There is no CVA tenderness, no tenderness at McBurney's point and negative Murphy's sign.         Soft non tender. Non pulsatile aorta.   Skin: Skin is warm, dry and intact. No rash noted. He is not diaphoretic. No cyanosis. Nails show no clubbing.    ED Course  Procedures (including critical care time)   Date: 03/08/2012  Rate: 66  Rhythm: normal sinus rhythm  QRS Axis: normal  Intervals: normal  ST/T Wave abnormalities: normal  Conduction Disutrbances: none  Narrative Interpretation:   Old EKG Reviewed: No significant changes noted    Labs Reviewed  CBC - Abnormal; Notable for the following:    WBC 16.9 (*)    All other components within normal limits  COMPREHENSIVE METABOLIC PANEL - Abnormal; Notable for the following:    Glucose, Bld 139 (*)    AST 155 (*)    ALT 82 (*)    Alkaline Phosphatase 119 (*)    All other components within normal limits  LIPASE, BLOOD  POCT TROPONIN  TROPONIN I  LAB REPORT - SCANNED   US Abdomen Complete  03/08/2012  *RADIOLOGY REPORT*  Clinical Data:  Abdominal pain  ABDOMINAL ULTRASOUND COMPLETE  Comparison:  CT abdomen pelvis 06/08/2010  Findings:  Gallbladder:  Multiple mobile  gallstones are present.  The largest measured gallstone is 6 mm.  Gallbladder wall thickness is normal. Sonographic Murphy's sign is reportedly negative, but may be unreliable given the patient's sedation.  Common Bile Duct:  Measures 6.5 mm, slightly prominent.  No stone is seen within the common bile duct.  Liver: A5.0 x 3.4 x 3.5 cm cyst is seen in the right hepatic lobe. Slightly increased echogenicity of the liver raises the possibility of mild fatty infiltration of the liver.  No suspicious hepatic lesion or intrahepatic ductal dilatation.  IVC:  Visualized portion of the vena cava appears normal.  Pancreas:  Due to bowel gas, the pancreas is not visualized.  Spleen:  Within normal limits in size and echotexture.  Right kidney:  Normal in size and parenchymal echogenicity. Slight thinning of the renal cortex. No evidence of mass or hydronephrosis.  Left kidney:  Normal in size and parenchymal echogenicity.  Slight thinning of the renal cortex. No evidence of mass or hydronephrosis.  Abdominal Aorta:  No aneurysm identified.  IMPRESSION:  1.  Cholelithiasis.  Normal gallbladder wall thickness. 2.  Common bile duct is slightly prominent (6.5 mm). This appears unchanged compared to a CT of June 2011. 3.  Right hepatic lobe cyst.  Suspect mild fatty infiltration of the liver.  Original Report Authenticated By: Britta Mccreedy, M.D.   Dg Chest Portable 1 View  03/08/2012  *RADIOLOGY REPORT*  Clinical Data: Chest pain  PORTABLE CHEST - 1 VIEW  Comparison: 01/27/2012  Findings: The heart size and mediastinal contours are within normal limits. Chronic interstitial coarsening is noted bilaterally. No airspace consolidation.  The visualized skeletal structures are unremarkable.  IMPRESSION:  1. No acute cardiopulmonary abnormalities.  Original Report Authenticated By: Rosealee Albee, M.D.     1. Abdominal pain   2. Nausea & vomiting   3. Gallstones       MDM  Cholelithiasis  General surgery contacted to  see pt and  admit.         Jaci Carrel, [place] 03/10/12 0117

## 2012-03-08 NOTE — ED Notes (Signed)
Patient is resting comfortably. 

## 2012-03-08 NOTE — ED Notes (Signed)
Patient states chest pain 10/10 sharp with nausea vomiting and diarrhea.  Patient moaning upon arrival to ED had one emesis moaning resolved.  Patient stated needed to use bathroom because he needed to have a bowel movement.  Patient states could not wait until seen by Doctor and walked to bathroom without incident.  Family member at bedside.

## 2012-03-10 ENCOUNTER — Telehealth: Payer: Self-pay

## 2012-03-10 NOTE — ED Provider Notes (Signed)
Medical screening examination/treatment/procedure(s) were conducted as a shared visit with non-physician practitioner(s) and myself.  I personally evaluated the patient during the encounter   Nat Christen, MD 03/10/12 [phone]

## 2012-03-10 NOTE — Telephone Encounter (Signed)
BRANDY FROM [facility] WOULD LIKE FOR [place] TO FAX THEM NOTES REGARDING PTS GALLBLADDER, IF THEY DO NOT RECEIVE THESE RECORDS BY 3:00PM TODAY THEN THEY WILL HAVE TO CANCEL THE PATIENTS APPT.  [phone] CB:[phone]

## 2012-03-10 NOTE — Telephone Encounter (Signed)
Notes faxed per request  bf

## 2013-02-27 ENCOUNTER — Other Ambulatory Visit: Payer: Self-pay | Admitting: Physician Assistant

## 2013-02-27 DIAGNOSIS — G8929 Other chronic pain: Secondary | ICD-10-CM

## 2013-03-02 ENCOUNTER — Other Ambulatory Visit: Payer: 59

## 2013-09-08 ENCOUNTER — Encounter: Payer: Self-pay | Admitting: Neurology

## 2013-09-11 ENCOUNTER — Ambulatory Visit: Payer: 59 | Admitting: Neurology

## 2014-05-30 ENCOUNTER — Encounter (HOSPITAL_COMMUNITY): Payer: Self-pay | Admitting: Emergency Medicine

## 2014-05-30 ENCOUNTER — Emergency Department (HOSPITAL_COMMUNITY)
Admission: EM | Admit: 2014-05-30 | Discharge: 2014-05-30 | Disposition: A | Discharge status: Home / Self Care | Payer: 59 | Source: Home / Self Care | Attending: Family Medicine | Admitting: Family Medicine

## 2014-05-30 ENCOUNTER — Emergency Department (INDEPENDENT_AMBULATORY_CARE_PROVIDER_SITE_OTHER): Payer: 59

## 2014-05-30 DIAGNOSIS — M545 Low back pain, unspecified: Secondary | ICD-10-CM

## 2014-05-30 DIAGNOSIS — J441 Chronic obstructive pulmonary disease with (acute) exacerbation: Secondary | ICD-10-CM

## 2014-05-30 MED ORDER — CYCLOBENZAPRINE HCL 10 MG PO TABS: 10.0000 mg | ORAL_TABLET | Freq: Every evening | ORAL | Status: DC | PRN

## 2014-05-30 MED ORDER — IPRATROPIUM BROMIDE 0.02 % IN SOLN
RESPIRATORY_TRACT | Status: AC
  Filled 2014-05-30: qty 2.5

## 2014-05-30 MED ORDER — ALBUTEROL SULFATE (2.5 MG/3ML) 0.083% IN NEBU
5.0000 mg | INHALATION_SOLUTION | Freq: Once | RESPIRATORY_TRACT | Status: AC
  Administered 2014-05-30: 5 mg via RESPIRATORY_TRACT

## 2014-05-30 MED ORDER — ALBUTEROL SULFATE (2.5 MG/3ML) 0.083% IN NEBU
INHALATION_SOLUTION | RESPIRATORY_TRACT | Status: AC
  Filled 2014-05-30: qty 6

## 2014-05-30 MED ORDER — IPRATROPIUM BROMIDE 0.02 % IN SOLN
0.5000 mg | Freq: Once | RESPIRATORY_TRACT | Status: AC
  Administered 2014-05-30: 0.5 mg via RESPIRATORY_TRACT

## 2014-05-30 MED ORDER — PREDNISONE 50 MG PO TABS: 50.0000 mg | ORAL_TABLET | Freq: Every day | ORAL | Status: DC
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
## 2014-05-30 NOTE — Discharge Instructions (Signed)
Thank you for coming in today. Take prednisone daily for 5 days.  Go back to your usual prednisone dose after 5 days.  Use albuterol as needed.  Use flexeril for muscle spasm.  Come back as needed.  Come back or go to the emergency room if you notice new weakness new numbness problems walking or bowel or bladder problems. Call or go to the emergency room if you get worse, have trouble breathing, have chest pains, or palpitations.     Back Pain, Adult Low back pain is very common. About 1 in 5 people have back pain.The cause of low back pain is rarely dangerous. The pain often gets better over time.About half of people with a sudden onset of back pain feel better in just 2 weeks. About 8 in 10 people feel better by 6 weeks.  CAUSES Some common causes of back pain include:  Strain of the muscles or ligaments supporting the spine.  Wear and tear (degeneration) of the spinal discs.  Arthritis.  Direct injury to the back. DIAGNOSIS Most of the time, the direct cause of low back pain is not known.However, back pain can be treated effectively even when the exact cause of the pain is unknown.Answering your caregiver's questions about your overall health and symptoms is one of the most accurate ways to make sure the cause of your pain is not dangerous. If your caregiver needs more information, he or she may order lab work or imaging tests (X-rays or MRIs).However, even if imaging tests show changes in your back, this usually does not require surgery. HOME CARE INSTRUCTIONS For many people, back pain returns.Since low back pain is rarely dangerous, it is often a condition that people can learn to [facility] their own.   Remain active. It is stressful on the back to sit or stand in one place. Do not sit, drive, or stand in one place for more than 30 minutes at a time. Take short walks on level surfaces as soon as pain allows.Try to increase the length of time you walk each day.  Do not stay  in bed.Resting more than 1 or 2 days can delay your recovery.  Do not avoid exercise or work.Your body is made to move.It is not dangerous to be active, even though your back may hurt.Your back will likely heal faster if you return to being active before your pain is gone.  Pay attention to your body when you bend and lift. Many people have less discomfortwhen lifting if they bend their knees, keep the load close to their bodies,and avoid twisting. Often, the most comfortable positions are those that put less stress on your recovering back.  Find a comfortable position to sleep. Use a firm mattress and lie on your side with your knees slightly bent. If you lie on your back, put a pillow under your knees.  Only take over-the-counter or prescription medicines as directed by your caregiver. Over-the-counter medicines to reduce pain and inflammation are often the most helpful.Your caregiver may prescribe muscle relaxant drugs.These medicines help dull your pain so you can more quickly return to your normal activities and healthy exercise.  Put ice on the injured area.  Put ice in a plastic bag.  Place a towel between your skin and the bag.  Leave the ice on for 15-20 minutes, 03-04 times a day for the first 2 to 3 days. After that, ice and heat may be alternated to reduce pain and spasms.  Ask your caregiver about trying  back exercises and gentle massage. This may be of some benefit.  Avoid feeling anxious or stressed.Stress increases muscle tension and can worsen back pain.It is important to recognize when you are anxious or stressed and learn ways to manage it.Exercise is a great option. SEEK MEDICAL CARE IF:  You have pain that is not relieved with rest or medicine.  You have pain that does not improve in 1 week.  You have new symptoms.  You are generally not feeling well. SEEK IMMEDIATE MEDICAL CARE IF:   You have pain that radiates from your back into your legs.  You  develop new bowel or bladder control problems.  You have unusual weakness or numbness in your arms or legs.  You develop nausea or vomiting.  You develop abdominal pain.  You feel faint. Document Released: 12/13/2005 Document Revised: 06/13/2012 Document Reviewed: 05/03/2011 [facility] Patient Information 2014 [place], [place].  Chronic Obstructive Pulmonary Disease Exacerbation Chronic obstructive pulmonary disease (COPD) is a common lung condition in which airflow from the lungs is limited. COPD is a general term that can be used to describe many different lung problems that limit airflow, including chronic bronchitis and emphysema. COPD exacerbations are episodes when breathing symptoms become much worse and require extra treatment. Without treatment, COPD exacerbations can be life threatening, and frequent COPD exacerbations can cause further damage to your lungs. CAUSES   Respiratory infections.   Exposure to smoke.   Exposure to air pollution, chemical fumes, or dust. Sometimes there is no apparent cause or trigger. RISK FACTORS  Smoking cigarettes.  Older age.  Frequent prior COPD exacerbations. SIGNS AND SYMPTOMS   Increased coughing.   Increased thick spit (sputum) production.   Increased wheezing.   Increased shortness of breath.   Rapid breathing.   Chest tightness. DIAGNOSIS  Your medical history, a physical exam, and tests will help your health care provider make a diagnosis. Tests may include:  A chest X-ray.  Basic lab tests.  Sputum testing.  An arterial blood gas test. TREATMENT  Depending on the severity of your COPD exacerbation, you may need to be admitted to a hospital for treatment. Some of the treatments commonly used to treat COPD exacerbations are:   Antibiotic medicines.   Bronchodilators. These are drugs that expand the air passages. They may be given with an inhaler or nebulizer. Spacer devices may be needed to help improve  drug delivery.  Corticosteroid medicines.  Supplemental oxygen therapy.  HOME CARE INSTRUCTIONS   Do not smoke. Quitting smoking is very important to prevent COPD from getting worse and exacerbations from happening as often.  Avoid exposure to all substances that irritate the airway, especially to tobacco smoke.   If prescribed, take your antibiotics as directed. Finish them even if you start to feel better.  Only take over-the-counter or prescription medicines as directed by your health care provider.It is important to use correct technique with inhaled medicines.  Drink enough fluids to keep your urine clear or pale yellow (unless you have a medical condition that requires fluid restriction).  Use a cool mist vaporizer. This makes it easier to clear your chest when you cough.   If you have a home nebulizer and oxygen, continue to use them as directed.   Maintain all necessary vaccinations to prevent infections.   Exercise regularly.   Eat a healthy diet.   Keep all follow-up appointments as directed by your health care provider. SEEK IMMEDIATE MEDICAL CARE IF:  You have worsening shortness of breath.  You have trouble talking.   You have severe chest pain.  You have blood in your sputum.  You have a fever.  You have weakness, vomit repeatedly, or faint.   You feel confused.   You continue to get worse. MAKE SURE YOU:   Understand these instructions.  Will watch your condition.  Will get help right away if you are not doing well or get worse. Document Released: 10/10/2007 Document Revised: 10/03/2013 Document Reviewed: 08/17/2013 [facility] Patient Information 2014 [place].

## 2014-06-06 ENCOUNTER — Other Ambulatory Visit (HOSPITAL_COMMUNITY): Payer: Self-pay | Admitting: Gastroenterology

## 2014-06-06 DIAGNOSIS — R112 Nausea with vomiting, unspecified: Secondary | ICD-10-CM

## 2014-07-08 ENCOUNTER — Emergency Department (HOSPITAL_COMMUNITY)
Admission: EM | Admit: 2014-07-08 | Discharge: 2014-07-08 | Disposition: A | Discharge status: Home / Self Care | Payer: 59 | Attending: Emergency Medicine | Admitting: Emergency Medicine

## 2014-07-08 ENCOUNTER — Encounter (HOSPITAL_COMMUNITY): Payer: Self-pay | Admitting: Emergency Medicine

## 2014-07-08 ENCOUNTER — Emergency Department (HOSPITAL_COMMUNITY): Payer: 59

## 2014-07-08 DIAGNOSIS — Z7982 Long term (current) use of aspirin: Secondary | ICD-10-CM | POA: Insufficient documentation

## 2014-07-08 DIAGNOSIS — F17200 Nicotine dependence, unspecified, uncomplicated: Secondary | ICD-10-CM | POA: Insufficient documentation

## 2014-07-08 DIAGNOSIS — J449 Chronic obstructive pulmonary disease, unspecified: Secondary | ICD-10-CM | POA: Insufficient documentation

## 2014-07-08 DIAGNOSIS — M545 Low back pain, unspecified: Secondary | ICD-10-CM | POA: Insufficient documentation

## 2014-07-08 DIAGNOSIS — R634 Abnormal weight loss: Secondary | ICD-10-CM | POA: Insufficient documentation

## 2014-07-08 DIAGNOSIS — M5430 Sciatica, unspecified side: Secondary | ICD-10-CM | POA: Insufficient documentation

## 2014-07-08 DIAGNOSIS — IMO0002 Reserved for concepts with insufficient information to code with codable children: Secondary | ICD-10-CM | POA: Insufficient documentation

## 2014-07-08 DIAGNOSIS — Z79899 Other long term (current) drug therapy: Secondary | ICD-10-CM | POA: Insufficient documentation

## 2014-07-08 DIAGNOSIS — Z85840 Personal history of malignant neoplasm of eye: Secondary | ICD-10-CM | POA: Insufficient documentation

## 2014-07-08 DIAGNOSIS — I10 Essential (primary) hypertension: Secondary | ICD-10-CM | POA: Insufficient documentation

## 2014-07-08 DIAGNOSIS — G608 Other hereditary and idiopathic neuropathies: Secondary | ICD-10-CM | POA: Insufficient documentation

## 2014-07-08 DIAGNOSIS — M109 Gout, unspecified: Secondary | ICD-10-CM | POA: Insufficient documentation

## 2014-07-08 DIAGNOSIS — J4489 Other specified chronic obstructive pulmonary disease: Secondary | ICD-10-CM | POA: Insufficient documentation

## 2014-07-08 DIAGNOSIS — R1110 Vomiting, unspecified: Secondary | ICD-10-CM | POA: Insufficient documentation

## 2014-07-08 LAB — BASIC METABOLIC PANEL
ANION GAP: 13 (ref 5–15)
BUN: 18 mg/dL (ref 6–23)
CHLORIDE: 99 meq/L (ref 96–112)
CO2: 26 meq/L (ref 19–32)
CREATININE: 0.82 mg/dL (ref 0.50–1.35)
Calcium: 9.1 mg/dL (ref 8.4–10.5)
GFR calc non Af Amer: >90 mL/min (ref >90)
Glucose, Bld: 92 mg/dL (ref 70–99)
POTASSIUM: 4.6 meq/L (ref 3.7–5.3)
SODIUM: 138 meq/L (ref 137–147)

## 2014-07-08 LAB — CBC
HCT: 38.6 % — ABNORMAL LOW (ref 39.0–52.0)
Hemoglobin: 13.3 g/dL (ref 13.0–17.0)
MCH: 29.8 pg (ref 26.0–34.0)
MCHC: 34.5 g/dL (ref 30.0–36.0)
MCV: 86.5 fL (ref 78.0–100.0)
PLATELETS: 359 10*3/uL (ref 150–400)
RBC: 4.46 MIL/uL (ref 4.22–5.81)
RDW: 13 % (ref 11.5–15.5)
WBC: 9.1 10*3/uL (ref 4.0–10.5)

## 2014-07-08 MED ORDER — SODIUM CHLORIDE 0.9 % IV BOLUS (SEPSIS)
1000.0000 mL | Freq: Once | INTRAVENOUS | Status: AC
  Administered 2014-07-08: 1000 mL via INTRAVENOUS

## 2014-07-08 MED ORDER — OXYCODONE HCL 10 MG PO TABS: 10.0000 mg | ORAL_TABLET | ORAL | Status: DC | PRN

## 2014-07-08 MED ORDER — HYDROMORPHONE HCL PF 1 MG/ML IJ SOLN
1.0000 mg | Freq: Once | INTRAMUSCULAR | Status: AC
  Administered 2014-07-08: 1 mg via INTRAVENOUS
  Filled 2014-07-08: qty 1

## 2014-07-08 NOTE — ED Provider Notes (Signed)
CSN: [id number]     Arrival date & time 07/08/14  9528 History   First MD Initiated Contact with Patient 07/08/14 [phone]     Chief Complaint  Patient presents with  . Back Pain     (Consider location/radiation/quality/duration/timing/severity/associated sxs/prior Treatment) HPI 63 year old male presents with worsening low back pain. This pain started approximately 6 weeks ago. Does not recall a specific injury. The pain has worsened over last 4-5 days. Denies any fevers or chills. In 1981 he had similar symptoms and had low back surgery. The pain is right where his scar from his back surgery is. Denies a weakness in his legs. He's been having right foot numbness over the 6 weeks. Is not worsened or gotten better. He has chronic pain and is being referred to a chronic pain clinic. He is currently is on a fentanyl patch and has OxyContin that he takes at night. Denies any saddle anesthesia. No bowel or bladder incontinence. His wife also relates that he's been having vomiting every day for last 4-5 weeks. He's lost 5 pounds during this time. He has a "stomach study" 4 days from now. The wife also noticed a knot on his mid left back yesterday that she wants checked out. Patient denies any pain around that site. No redness or drainage. He is scheduled to have an MRI in 9 days.  Past Medical History  Diagnosis Date  . Chest pain     previous CP thought r/t GI; mild CAD by cath 2002  . Cancer     of the eye  . Hypertension     pt denies h/o htn  . COPD (chronic obstructive pulmonary disease)   . Hyperlipidemia     taken off statin 1 mo ago by primary care bc he was "put on a new med that might interfere"  . Tobacco abuse   . 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 Gout   . Shortness of breath   . Syncope    Past Surgical History  Procedure Laterality Date  . Shoulder surgery    . Neck surgery    . Eye surgery      cancer  . Spine surgery    . Knee cartilage surgery    .  Cholecystectomy     Family History  Problem Relation Age of Onset  . Heart attack Mother 3  . Heart attack Brother 65  . Diabetes Mother   . Multiple sclerosis Brother   . Diabetes Sister   . Alzheimer's disease Maternal Grandmother   . Diabetes Maternal Grandmother   . Seizures Father    History  Substance Use Topics  . Smoking status: Smoker, Current Status Unknown -- 0.50 packs/day for 20 years    Types: Cigarettes  . Smokeless tobacco: Never Used  . Alcohol Use: No    Review of Systems  Constitutional: Positive for unexpected weight change. Negative for fever.  Gastrointestinal: Positive for vomiting. Negative for abdominal pain.  Musculoskeletal: Positive for back pain.  Neurological: Positive for numbness. Negative for weakness.  All other systems reviewed and are negative.     Allergies  Demerol and Meperidine hcl  Home Medications   Prior to Admission medications   Medication Sig Start Date End Date Taking? Authorizing Provider  albuterol (PROVENTIL) (2.5 MG/3ML) 0.083% nebulizer solution Take 2.5 mg by nebulization every 6 (six) hours as needed. For shortness of breath    Historical Provider, MD  albuterol (PROVENTIL,VENTOLIN) 90  MCG/ACT inhaler Inhale 2 puffs into the lungs every 6 (six) hours as needed. For shortness of breath 01/28/12 01/22/13  Nishant Dhungel, MD  aspirin 81 MG chewable tablet Chew 162 mg by mouth 2 (two) times daily.    Historical Provider, MD  budesonide-formoterol (SYMBICORT) 160-4.5 MCG/ACT inhaler Inhale 2 puffs into the lungs 2 (two) times daily. 01/28/12 01/27/13  Nishant Dhungel, MD  colchicine 0.6 MG tablet Take 0.6 mg by mouth daily as needed. For gout    Historical Provider, MD  cyclobenzaprine (FLEXERIL) 10 MG tablet Take 1 tablet (10 mg total) by mouth at bedtime as needed for muscle spasms. 05/30/14   Gregor Hams, MD  diphenhydrAMINE (SOMINEX) 25 MG tablet Take 25 mg by mouth at bedtime as needed. For insomnia    Historical Provider, MD   febuxostat (ULORIC) 40 MG tablet Take 80 mg by mouth daily as needed. For gout    Historical Provider, MD  fentaNYL (DURAGESIC - DOSED MCG/HR) 75 MCG/HR Place 1 patch onto the skin every 3 (three) days.    Historical Provider, MD  ipratropium-albuterol (DUONEB) 0.5-2.5 (3) MG/3ML SOLN Take 3 mLs by nebulization daily as needed. For when he has trouble breathing    Historical Provider, MD  LORazepam (ATIVAN) 1 MG tablet Take 1 mg by mouth at bedtime as needed. For anxiety & sleep    Historical Provider, MD  montelukast (SINGULAIR) 10 MG tablet Take 10 mg by mouth daily.     Historical Provider, MD  olopatadine (PATANOL) 0.1 % ophthalmic solution Place 1 drop into both eyes daily as needed. For allergy    Historical Provider, MD  oxyCODONE (OXYCONTIN) 40 MG 12 hr tablet Take 40 mg by mouth every 12 (twelve) hours. For pain    Historical Provider, MD  pantoprazole (PROTONIX) 40 MG tablet Take 40 mg by mouth daily.    Historical Provider, MD  predniSONE (DELTASONE) 10 MG tablet Take 10 mg by mouth daily. 02/02/12   Nishant Dhungel, MD  predniSONE (DELTASONE) 50 MG tablet Take 1 tablet (50 mg total) by mouth daily. 05/30/14   Gregor Hams, MD  tiotropium (SPIRIVA) 18 MCG inhalation capsule Place 18 mcg into inhaler and inhale daily. 01/28/12 01/27/13  Nishant Dhungel, MD   BP 140/84  Pulse 98  Temp(Src) 97.7 F (36.5 C) (Oral)  Resp 20  SpO2 94% Physical Exam  Nursing note and vitals reviewed. Constitutional: He is oriented to person, place, and time. He appears well-developed and well-nourished.  Appears in pain, rolled over on right side  HENT:  Head: Normocephalic and atraumatic.  Right Ear: External ear normal.  Left Ear: External ear normal.  Nose: Nose normal.  Eyes: Right eye exhibits no discharge. Left eye exhibits no discharge.  Neck: Neck supple.  Cardiovascular: Normal rate, regular rhythm, normal heart sounds and intact distal pulses.   Pulmonary/Chest: Effort normal.  Abdominal:  Soft. There is no tenderness.  Musculoskeletal: He exhibits no edema.       Lumbar back: He exhibits tenderness (midline tenderness over vertical lumbar scar).       Back:  Neurological: He is alert and oriented to person, place, and time. He displays no Babinski's sign on the right side. He displays no Babinski's sign on the left side.  Reflex Scores:      Patellar reflexes are 2+ on the right side and 2+ on the left side.      Achilles reflexes are 2+ on the right side and 2+ on the  left side. 5/5 strength in bilateral lower extremities. Feels decreased sensation over entire right foot and ankle.  Skin: Skin is warm and dry.    ED Course  Procedures (including critical care time) Labs Review Labs Reviewed  CBC - Abnormal; Notable for the following:    HCT 38.6 (*)    All other components within normal limits  BASIC METABOLIC PANEL    Imaging Review Dg Lumbar Spine Complete  07/08/2014   CLINICAL DATA:  63 year old male with low back pain radiating into both hips and legs. History of remote back surgery.  EXAM: LUMBAR SPINE - COMPLETE 4+ VIEW  COMPARISON:  05/30/2014 radiographs.  04/10/2008 MR  FINDINGS: Normal alignment noted.  Mild multilevel degenerative disc disease again identified.  Mild to moderate facet arthropathy in the mid and lower lumbar spine is unchanged.  There is no evidence of acute fracture or subluxation.  No focal bony lesions are noted.  Postsurgical changes in the posterior lower lumbar spine identified.  IMPRESSION: No evidence of acute abnormality.  Mild multilevel degenerative disc disease and mild to moderate facet arthropathy.   Electronically Signed   By: Hassan Rowan M.D.   On: 07/08/2014 11:05     EKG Interpretation None      MDM   Final diagnoses:  Midline low back pain, with sciatica presence unspecified    At this time there is no indication for an acute MRI. He has normal neurologic testing and no signs of spinal cord compression such as bowel  or bladder incontinence or saddle anesthesia. His pain is well controlled with one dose of IV Dilaudid. We'll give him a small course of oral oxycodone he states he's out of his OxyContin but I advised he needs a followup with his PCP ASAP as she will need continue pain control. As his MRI scheduled next week for the wait for this as his right foot numbness has been continued and not worsening over 6 weeks.    Ephraim Hamburger, MD 07/08/14 [phone]

## 2014-07-08 NOTE — ED Notes (Signed)
Pt has hx of back surgery in 1981.  Pt is having back pain since about 6 weeks ago.  First dx with muscle pain.  Pt is scheduled for MRI on 22nd d/t not resolving.  Pt back in worsening.  Per spouse, pt is not eating, not sleeping well.  Knot also noted on back noticed yesterday.  Knot is upper back.  Pain in lower back.  Pain also noted down top of bilateral legs.  Pt states rt foot feels like it is asleep all the time.  No recent trauma or injury.

## 2014-07-08 NOTE — Discharge Instructions (Signed)
SEEK IMMEDIATE MEDICAL ATTENTION IF: New numbness, tingling, weakness, or problem with the use of your arms or legs.  Severe back pain not relieved with medications.  Change in bowel or bladder control.  Increasing pain in any areas of the body (such as chest or abdominal pain).  Shortness of breath, dizziness or fainting.  Nausea (feeling sick to your stomach), vomiting, fever, or sweats.  

## 2014-07-09 ENCOUNTER — Other Ambulatory Visit: Payer: Self-pay | Admitting: Gastroenterology

## 2014-07-12 ENCOUNTER — Ambulatory Visit (HOSPITAL_COMMUNITY): Payer: 59

## 2014-07-13 ENCOUNTER — Encounter (HOSPITAL_COMMUNITY): Payer: Self-pay | Admitting: Emergency Medicine

## 2014-07-13 ENCOUNTER — Inpatient Hospital Stay (HOSPITAL_COMMUNITY)
Admission: EM | Admit: 2014-07-13 | Discharge: 2014-07-24 | DRG: 542 | Disposition: A | Discharge status: Skilled Nursing Facility | Payer: 59 | Attending: Internal Medicine | Admitting: Internal Medicine

## 2014-07-13 ENCOUNTER — Inpatient Hospital Stay (HOSPITAL_COMMUNITY): Payer: 59

## 2014-07-13 ENCOUNTER — Emergency Department (HOSPITAL_COMMUNITY): Payer: 59

## 2014-07-13 DIAGNOSIS — R3911 Hesitancy of micturition: Secondary | ICD-10-CM | POA: Diagnosis present

## 2014-07-13 DIAGNOSIS — F17200 Nicotine dependence, unspecified, uncomplicated: Secondary | ICD-10-CM | POA: Diagnosis present

## 2014-07-13 DIAGNOSIS — M542 Cervicalgia: Secondary | ICD-10-CM | POA: Diagnosis present

## 2014-07-13 DIAGNOSIS — G609 Hereditary and idiopathic neuropathy, unspecified: Secondary | ICD-10-CM

## 2014-07-13 DIAGNOSIS — C7951 Secondary malignant neoplasm of bone: Secondary | ICD-10-CM | POA: Diagnosis present

## 2014-07-13 DIAGNOSIS — J4489 Other specified chronic obstructive pulmonary disease: Secondary | ICD-10-CM

## 2014-07-13 DIAGNOSIS — I10 Essential (primary) hypertension: Secondary | ICD-10-CM | POA: Diagnosis present

## 2014-07-13 DIAGNOSIS — G4700 Insomnia, unspecified: Secondary | ICD-10-CM | POA: Diagnosis not present

## 2014-07-13 DIAGNOSIS — G608 Other hereditary and idiopathic neuropathies: Secondary | ICD-10-CM | POA: Diagnosis present

## 2014-07-13 DIAGNOSIS — Z860100 Personal history of colon polyps, unspecified: Secondary | ICD-10-CM

## 2014-07-13 DIAGNOSIS — D61818 Other pancytopenia: Secondary | ICD-10-CM | POA: Diagnosis present

## 2014-07-13 DIAGNOSIS — M549 Dorsalgia, unspecified: Secondary | ICD-10-CM | POA: Diagnosis present

## 2014-07-13 DIAGNOSIS — Z85840 Personal history of malignant neoplasm of eye: Secondary | ICD-10-CM

## 2014-07-13 DIAGNOSIS — Z79899 Other long term (current) drug therapy: Secondary | ICD-10-CM

## 2014-07-13 DIAGNOSIS — IMO0002 Reserved for concepts with insufficient information to code with codable children: Secondary | ICD-10-CM | POA: Diagnosis not present

## 2014-07-13 DIAGNOSIS — R64 Cachexia: Secondary | ICD-10-CM | POA: Diagnosis present

## 2014-07-13 DIAGNOSIS — J9601 Acute respiratory failure with hypoxia: Secondary | ICD-10-CM

## 2014-07-13 DIAGNOSIS — I2510 Atherosclerotic heart disease of native coronary artery without angina pectoris: Secondary | ICD-10-CM | POA: Diagnosis present

## 2014-07-13 DIAGNOSIS — Z85820 Personal history of malignant melanoma of skin: Secondary | ICD-10-CM | POA: Diagnosis not present

## 2014-07-13 DIAGNOSIS — Z8249 Family history of ischemic heart disease and other diseases of the circulatory system: Secondary | ICD-10-CM | POA: Diagnosis not present

## 2014-07-13 DIAGNOSIS — D62 Acute posthemorrhagic anemia: Secondary | ICD-10-CM | POA: Diagnosis present

## 2014-07-13 DIAGNOSIS — M5137 Other intervertebral disc degeneration, lumbosacral region: Secondary | ICD-10-CM | POA: Diagnosis present

## 2014-07-13 DIAGNOSIS — M25519 Pain in unspecified shoulder: Secondary | ICD-10-CM | POA: Diagnosis present

## 2014-07-13 DIAGNOSIS — J9600 Acute respiratory failure, unspecified whether with hypoxia or hypercapnia: Secondary | ICD-10-CM | POA: Diagnosis present

## 2014-07-13 DIAGNOSIS — R532 Functional quadriplegia: Secondary | ICD-10-CM | POA: Diagnosis present

## 2014-07-13 DIAGNOSIS — R32 Unspecified urinary incontinence: Secondary | ICD-10-CM | POA: Diagnosis present

## 2014-07-13 DIAGNOSIS — Z885 Allergy status to narcotic agent status: Secondary | ICD-10-CM

## 2014-07-13 DIAGNOSIS — Z791 Long term (current) use of non-steroidal anti-inflammatories (NSAID): Secondary | ICD-10-CM | POA: Diagnosis not present

## 2014-07-13 DIAGNOSIS — Z833 Family history of diabetes mellitus: Secondary | ICD-10-CM

## 2014-07-13 DIAGNOSIS — F329 Major depressive disorder, single episode, unspecified: Secondary | ICD-10-CM | POA: Diagnosis present

## 2014-07-13 DIAGNOSIS — M109 Gout, unspecified: Secondary | ICD-10-CM | POA: Diagnosis present

## 2014-07-13 DIAGNOSIS — J449 Chronic obstructive pulmonary disease, unspecified: Secondary | ICD-10-CM

## 2014-07-13 DIAGNOSIS — G8929 Other chronic pain: Secondary | ICD-10-CM | POA: Diagnosis present

## 2014-07-13 DIAGNOSIS — Z515 Encounter for palliative care: Secondary | ICD-10-CM

## 2014-07-13 DIAGNOSIS — E43 Unspecified severe protein-calorie malnutrition: Secondary | ICD-10-CM

## 2014-07-13 DIAGNOSIS — R634 Abnormal weight loss: Secondary | ICD-10-CM | POA: Diagnosis present

## 2014-07-13 DIAGNOSIS — F3289 Other specified depressive episodes: Secondary | ICD-10-CM | POA: Diagnosis present

## 2014-07-13 DIAGNOSIS — F411 Generalized anxiety disorder: Secondary | ICD-10-CM | POA: Diagnosis present

## 2014-07-13 DIAGNOSIS — J441 Chronic obstructive pulmonary disease with (acute) exacerbation: Secondary | ICD-10-CM

## 2014-07-13 DIAGNOSIS — E785 Hyperlipidemia, unspecified: Secondary | ICD-10-CM | POA: Diagnosis present

## 2014-07-13 DIAGNOSIS — D492 Neoplasm of unspecified behavior of bone, soft tissue, and skin: Secondary | ICD-10-CM

## 2014-07-13 DIAGNOSIS — C7952 Secondary malignant neoplasm of bone marrow: Principal | ICD-10-CM

## 2014-07-13 DIAGNOSIS — G5790 Unspecified mononeuropathy of unspecified lower limb: Secondary | ICD-10-CM | POA: Diagnosis present

## 2014-07-13 DIAGNOSIS — Z923 Personal history of irradiation: Secondary | ICD-10-CM

## 2014-07-13 DIAGNOSIS — M545 Low back pain: Secondary | ICD-10-CM

## 2014-07-13 DIAGNOSIS — R599 Enlarged lymph nodes, unspecified: Secondary | ICD-10-CM | POA: Diagnosis present

## 2014-07-13 DIAGNOSIS — M479 Spondylosis, unspecified: Secondary | ICD-10-CM | POA: Diagnosis present

## 2014-07-13 DIAGNOSIS — D630 Anemia in neoplastic disease: Secondary | ICD-10-CM | POA: Diagnosis present

## 2014-07-13 DIAGNOSIS — D500 Iron deficiency anemia secondary to blood loss (chronic): Secondary | ICD-10-CM | POA: Diagnosis present

## 2014-07-13 DIAGNOSIS — C786 Secondary malignant neoplasm of retroperitoneum and peritoneum: Secondary | ICD-10-CM | POA: Diagnosis present

## 2014-07-13 DIAGNOSIS — C412 Malignant neoplasm of vertebral column: Secondary | ICD-10-CM

## 2014-07-13 DIAGNOSIS — M79609 Pain in unspecified limb: Secondary | ICD-10-CM | POA: Diagnosis present

## 2014-07-13 DIAGNOSIS — C801 Malignant (primary) neoplasm, unspecified: Secondary | ICD-10-CM

## 2014-07-13 DIAGNOSIS — M51379 Other intervertebral disc degeneration, lumbosacral region without mention of lumbar back pain or lower extremity pain: Secondary | ICD-10-CM | POA: Diagnosis present

## 2014-07-13 DIAGNOSIS — G893 Neoplasm related pain (acute) (chronic): Secondary | ICD-10-CM

## 2014-07-13 DIAGNOSIS — R112 Nausea with vomiting, unspecified: Secondary | ICD-10-CM

## 2014-07-13 HISTORY — DX: Malignant (primary) neoplasm, unspecified: C80.1

## 2014-07-13 LAB — URINALYSIS, ROUTINE W REFLEX MICROSCOPIC
Bilirubin Urine: NEGATIVE
Glucose, UA: NEGATIVE mg/dL
Hgb urine dipstick: NEGATIVE
Ketones, ur: NEGATIVE mg/dL
LEUKOCYTES UA: NEGATIVE
Nitrite: NEGATIVE
PH: 6.5 (ref 5.0–8.0)
Protein, ur: NEGATIVE mg/dL
Specific Gravity, Urine: 1.009 (ref 1.005–1.030)
Urobilinogen, UA: 1 mg/dL (ref 0.0–1.0)

## 2014-07-13 LAB — HEPATIC FUNCTION PANEL
ALBUMIN: 3.4 g/dL — AB (ref 3.5–5.2)
ALT: 11 U/L (ref 0–53)
AST: 20 U/L (ref 0–37)
Alkaline Phosphatase: 127 U/L — ABNORMAL HIGH (ref 39–117)
Bilirubin, Direct: <0.2 mg/dL (ref 0.0–0.3)
Total Bilirubin: 0.4 mg/dL (ref 0.3–1.2)
Total Protein: 6.8 g/dL (ref 6.0–8.3)

## 2014-07-13 LAB — BASIC METABOLIC PANEL
Anion gap: 13 (ref 5–15)
BUN: 15 mg/dL (ref 6–23)
CO2: 28 meq/L (ref 19–32)
Calcium: 9 mg/dL (ref 8.4–10.5)
Chloride: 96 mEq/L (ref 96–112)
Creatinine, Ser: 0.77 mg/dL (ref 0.50–1.35)
GFR calc Af Amer: >90 mL/min (ref >90)
GFR calc non Af Amer: >90 mL/min (ref >90)
GLUCOSE: 96 mg/dL (ref 70–99)
POTASSIUM: 3.9 meq/L (ref 3.7–5.3)
SODIUM: 137 meq/L (ref 137–147)

## 2014-07-13 LAB — CBC
HCT: 37.5 % — ABNORMAL LOW (ref 39.0–52.0)
HEMOGLOBIN: 12.7 g/dL — AB (ref 13.0–17.0)
MCH: 30.2 pg (ref 26.0–34.0)
MCHC: 33.9 g/dL (ref 30.0–36.0)
MCV: 89.1 fL (ref 78.0–100.0)
Platelets: 326 10*3/uL (ref 150–400)
RBC: 4.21 MIL/uL — AB (ref 4.22–5.81)
RDW: 13.7 % (ref 11.5–15.5)
WBC: 9.1 10*3/uL (ref 4.0–10.5)

## 2014-07-13 MED ORDER — ENOXAPARIN SODIUM 40 MG/0.4ML ~~LOC~~ SOLN
40.0000 mg | SUBCUTANEOUS | Status: DC
Start: 2014-07-13 — End: 2014-07-15
  Administered 2014-07-13 – 2014-07-14 (×2): 40 mg via SUBCUTANEOUS
  Filled 2014-07-13 (×3): qty 0.4

## 2014-07-13 MED ORDER — ACETAMINOPHEN 325 MG PO TABS
650.0000 mg | ORAL_TABLET | Freq: Four times a day (QID) | ORAL | Status: DC | PRN
Start: 2014-07-13 — End: 2014-07-24

## 2014-07-13 MED ORDER — PANTOPRAZOLE SODIUM 40 MG PO TBEC
40.0000 mg | DELAYED_RELEASE_TABLET | Freq: Every morning | ORAL | Status: DC
  Administered 2014-07-13 – 2014-07-24 (×12): 40 mg via ORAL
  Filled 2014-07-13 (×14): qty 1

## 2014-07-13 MED ORDER — FENTANYL 75 MCG/HR TD PT72: 75.0000 ug | MEDICATED_PATCH | TRANSDERMAL | Status: DC

## 2014-07-13 MED ORDER — PAROXETINE HCL 10 MG PO TABS
10.0000 mg | ORAL_TABLET | Freq: Every morning | ORAL | Status: DC
  Administered 2014-07-13 – 2014-07-24 (×12): 10 mg via ORAL
  Filled 2014-07-13 (×12): qty 1

## 2014-07-13 MED ORDER — NITROGLYCERIN 0.4 MG SL SUBL: 0.4000 mg | SUBLINGUAL_TABLET | SUBLINGUAL | Status: DC | PRN

## 2014-07-13 MED ORDER — OXYCODONE HCL 5 MG PO TABS
10.0000 mg | ORAL_TABLET | ORAL | Status: DC | PRN
  Administered 2014-07-13: 10 mg via ORAL
  Filled 2014-07-13: qty 2

## 2014-07-13 MED ORDER — MAGNESIUM CITRATE PO SOLN: 1.0000 | Freq: Once | ORAL | Status: AC | PRN

## 2014-07-13 MED ORDER — SODIUM CHLORIDE 0.9 % IV SOLN
INTRAVENOUS | Status: DC
  Administered 2014-07-13: 12:00:00 via INTRAVENOUS

## 2014-07-13 MED ORDER — IOHEXOL 300 MG/ML  SOLN
50.0000 mL | Freq: Once | INTRAMUSCULAR | Status: AC | PRN
  Administered 2014-07-13: 50 mL via ORAL

## 2014-07-13 MED ORDER — KETOROLAC TROMETHAMINE 30 MG/ML IJ SOLN
30.0000 mg | Freq: Once | INTRAMUSCULAR | Status: AC
  Administered 2014-07-13: 30 mg via INTRAVENOUS
  Filled 2014-07-13: qty 1

## 2014-07-13 MED ORDER — PREGABALIN 75 MG PO CAPS
75.0000 mg | ORAL_CAPSULE | Freq: Two times a day (BID) | ORAL | Status: DC
  Administered 2014-07-13 – 2014-07-24 (×23): 75 mg via ORAL
  Filled 2014-07-13 (×23): qty 1

## 2014-07-13 MED ORDER — HYDROMORPHONE HCL PF 1 MG/ML IJ SOLN
1.0000 mg | Freq: Once | INTRAMUSCULAR | Status: AC
  Administered 2014-07-13: 1 mg via INTRAVENOUS
  Filled 2014-07-13: qty 1

## 2014-07-13 MED ORDER — TIOTROPIUM BROMIDE MONOHYDRATE 18 MCG IN CAPS
18.0000 ug | ORAL_CAPSULE | Freq: Every day | RESPIRATORY_TRACT | Status: DC
  Administered 2014-07-14 – 2014-07-24 (×9): 18 ug via RESPIRATORY_TRACT
  Filled 2014-07-13 (×2): qty 5

## 2014-07-13 MED ORDER — KETOROLAC TROMETHAMINE 30 MG/ML IJ SOLN
30.0000 mg | Freq: Four times a day (QID) | INTRAMUSCULAR | Status: AC | PRN
  Administered 2014-07-14 – 2014-07-17 (×6): 30 mg via INTRAVENOUS
  Filled 2014-07-13 (×3): qty 2
  Filled 2014-07-13: qty 1
  Filled 2014-07-13 (×2): qty 2

## 2014-07-13 MED ORDER — SENNA 8.6 MG PO TABS
1.0000 | ORAL_TABLET | Freq: Two times a day (BID) | ORAL | Status: DC
  Administered 2014-07-13 – 2014-07-20 (×15): 8.6 mg via ORAL
  Filled 2014-07-13 (×15): qty 1

## 2014-07-13 MED ORDER — HYDROMORPHONE HCL PF 1 MG/ML IJ SOLN
1.0000 mg | INTRAMUSCULAR | Status: DC | PRN
  Administered 2014-07-13 – 2014-07-15 (×11): 1 mg via INTRAVENOUS
  Filled 2014-07-13 (×11): qty 1

## 2014-07-13 MED ORDER — OXYCODONE HCL ER 40 MG PO T12A
40.0000 mg | EXTENDED_RELEASE_TABLET | Freq: Two times a day (BID) | ORAL | Status: DC
  Administered 2014-07-13 – 2014-07-17 (×9): 40 mg via ORAL
  Filled 2014-07-13 (×9): qty 1

## 2014-07-13 MED ORDER — ACETAMINOPHEN 650 MG RE SUPP: 650.0000 mg | Freq: Four times a day (QID) | RECTAL | Status: DC | PRN

## 2014-07-13 MED ORDER — DEXAMETHASONE 4 MG PO TABS
4.0000 mg | ORAL_TABLET | Freq: Four times a day (QID) | ORAL | Status: DC
  Administered 2014-07-13 – 2014-07-24 (×42): 4 mg via ORAL
  Filled 2014-07-13 (×48): qty 1

## 2014-07-13 MED ORDER — POLYETHYLENE GLYCOL 3350 17 G PO PACK
17.0000 g | PACK | Freq: Every day | ORAL | Status: DC | PRN
  Filled 2014-07-13: qty 1

## 2014-07-13 MED ORDER — MONTELUKAST SODIUM 10 MG PO TABS
10.0000 mg | ORAL_TABLET | Freq: Every morning | ORAL | Status: DC
  Administered 2014-07-13 – 2014-07-24 (×12): 10 mg via ORAL
  Filled 2014-07-13 (×12): qty 1

## 2014-07-13 MED ORDER — IBUPROFEN 400 MG PO TABS
400.0000 mg | ORAL_TABLET | Freq: Four times a day (QID) | ORAL | Status: DC | PRN
  Administered 2014-07-16: 400 mg via ORAL
  Filled 2014-07-13: qty 1

## 2014-07-13 MED ORDER — NICOTINE 21 MG/24HR TD PT24
21.0000 mg | MEDICATED_PATCH | Freq: Every day | TRANSDERMAL | Status: DC
  Administered 2014-07-13 – 2014-07-24 (×13): 21 mg via TRANSDERMAL
  Filled 2014-07-13 (×13): qty 1

## 2014-07-13 MED ORDER — ONDANSETRON HCL 4 MG/2ML IJ SOLN
4.0000 mg | Freq: Three times a day (TID) | INTRAMUSCULAR | Status: DC | PRN
  Administered 2014-07-13: 4 mg via INTRAVENOUS
  Filled 2014-07-13: qty 2

## 2014-07-13 MED ORDER — BUDESONIDE-FORMOTEROL FUMARATE 160-4.5 MCG/ACT IN AERO
2.0000 | INHALATION_SPRAY | Freq: Every day | RESPIRATORY_TRACT | Status: DC
  Administered 2014-07-13 – 2014-07-23 (×11): 2 via RESPIRATORY_TRACT
  Filled 2014-07-13: qty 6

## 2014-07-13 MED ORDER — SODIUM CHLORIDE 0.9 % IV SOLN
INTRAVENOUS | Status: DC
  Administered 2014-07-13: 1000 mL via INTRAVENOUS
  Administered 2014-07-14: 05:00:00 via INTRAVENOUS

## 2014-07-13 MED ORDER — GADOBENATE DIMEGLUMINE 529 MG/ML IV SOLN
20.0000 mL | Freq: Once | INTRAVENOUS | Status: AC | PRN
  Administered 2014-07-13: 17 mL via INTRAVENOUS

## 2014-07-13 MED ORDER — ZOLPIDEM TARTRATE 5 MG PO TABS: 5.0000 mg | ORAL_TABLET | Freq: Every evening | ORAL | Status: DC | PRN

## 2014-07-13 MED ORDER — IOHEXOL 300 MG/ML  SOLN
100.0000 mL | Freq: Once | INTRAMUSCULAR | Status: AC | PRN
  Administered 2014-07-13: 100 mL via INTRAVENOUS

## 2014-07-13 MED ORDER — BIOTENE DRY MOUTH MT LIQD
15.0000 mL | Freq: Two times a day (BID) | OROMUCOSAL | Status: DC
  Administered 2014-07-15 – 2014-07-24 (×17): 15 mL via OROMUCOSAL

## 2014-07-13 MED ORDER — LORAZEPAM 1 MG PO TABS: 1.0000 mg | ORAL_TABLET | Freq: Two times a day (BID) | ORAL | Status: DC | PRN

## 2014-07-13 MED ORDER — DEXAMETHASONE SODIUM PHOSPHATE 10 MG/ML IJ SOLN
10.0000 mg | Freq: Once | INTRAMUSCULAR | Status: AC
  Administered 2014-07-13: 10 mg via INTRAVENOUS
  Filled 2014-07-13: qty 1

## 2014-07-13 MED ORDER — ONDANSETRON HCL 4 MG/2ML IJ SOLN: 4.0000 mg | Freq: Four times a day (QID) | INTRAMUSCULAR | Status: DC | PRN

## 2014-07-13 MED ORDER — ALUM & MAG HYDROXIDE-SIMETH 200-200-20 MG/5ML PO SUSP: 30.0000 mL | Freq: Four times a day (QID) | ORAL | Status: DC | PRN

## 2014-07-13 MED ORDER — ENOXAPARIN SODIUM 40 MG/0.4ML ~~LOC~~ SOLN
40.0000 mg | SUBCUTANEOUS | Status: DC
  Filled 2014-07-13: qty 0.4

## 2014-07-13 MED ORDER — LORAZEPAM 1 MG PO TABS
1.0000 mg | ORAL_TABLET | Freq: Four times a day (QID) | ORAL | Status: DC | PRN
  Administered 2014-07-13 – 2014-07-16 (×7): 1 mg via ORAL
  Filled 2014-07-13 (×7): qty 1

## 2014-07-13 MED ORDER — OXYCODONE-ACETAMINOPHEN 5-325 MG PO TABS
2.0000 | ORAL_TABLET | Freq: Once | ORAL | Status: AC
  Administered 2014-07-13: 2 via ORAL
  Filled 2014-07-13: qty 2

## 2014-07-13 MED ORDER — ALBUTEROL SULFATE (2.5 MG/3ML) 0.083% IN NEBU: 3.0000 mL | INHALATION_SOLUTION | Freq: Four times a day (QID) | RESPIRATORY_TRACT | Status: DC | PRN

## 2014-07-13 MED ORDER — ONDANSETRON HCL 4 MG PO TABS: 4.0000 mg | ORAL_TABLET | Freq: Four times a day (QID) | ORAL | Status: DC | PRN

## 2014-07-13 MED ORDER — HYDROMORPHONE HCL PF 1 MG/ML IJ SOLN
1.0000 mg | INTRAMUSCULAR | Status: DC | PRN
  Administered 2014-07-13: 1 mg via INTRAVENOUS
  Filled 2014-07-13: qty 1

## 2014-07-13 MED ORDER — DIPHENHYDRAMINE HCL (SLEEP) 25 MG PO TABS: 25.0000 mg | ORAL_TABLET | Freq: Every evening | ORAL | Status: DC | PRN

## 2014-07-13 NOTE — ED Provider Notes (Signed)
Patient was signed out to me a followup MRI results and reassess. Patient requiring multiple doses of Dilaudid for pain control and still in significant pain.   On exam patient has normal strength bilateral lower chest remedies with flexion extension at major joints, sensation intact bilateral to palpation. Patient denies urinary or bowel changes. 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  Steroids ordered. Dg Lumbar Spine Complete  07/08/2014   CLINICAL DATA:  63 year old male with low back pain radiating into both hips and legs. History of remote back surgery.  EXAM: LUMBAR SPINE - COMPLETE 4+ VIEW  COMPARISON:  05/30/2014 radiographs.  04/10/2008 MR  FINDINGS: Normal alignment noted.  Mild multilevel degenerative disc disease again identified.  Mild to moderate facet arthropathy in the mid and lower lumbar spine is unchanged.  There is no evidence of acute fracture or subluxation.  No focal bony lesions are noted.  Postsurgical changes in the posterior lower lumbar spine identified.  IMPRESSION: No evidence of acute abnormality.  Mild multilevel degenerative disc disease and mild to moderate facet arthropathy.   Electronically Signed   By: Hassan Rowan M.D.   On: 07/08/2014 11:05   Mr Lumbar Spine W Wo Contrast  07/13/2014   CLINICAL DATA:  Low back pain radiating to the right thigh and knee.  EXAM: MRI LUMBAR SPINE WITHOUT AND WITH CONTRAST  TECHNIQUE: Multiplanar and multiecho pulse sequences of the lumbar spine were obtained without and with intravenous contrast.  CONTRAST:  75mL MULTIHANCE GADOBENATE DIMEGLUMINE 529 MG/ML IV SOLN  COMPARISON:  07/08/2014  FINDINGS: Unfortunately there are numerous masses scattered in the lumbar spine, sacrum, retroperitoneum, perirenal spaces, paraspinal musculature, and even the subcutaneous tissues highly concerning for a diffusely  metastatic process. Index lesions include a 3.9 cm mass along the right mid kidney medially which may or may not be arising from the kidney and a right paraspinal mass at the L3 level which measures 4.2 cm in AP dimension along the deep margin of the psoas muscle.  The bony metastatic lesions are observed involving T11, T12, L2, L3, and L4, as well as the S1 and S2 levels of the sacrum. The L3 level demonstrates cortical breakthrough along the posterior vertebral margin eccentric to the right and extending into the right pedicle, with right epidural tumor and tumor extending in the inferior aspect of the right neural foramen. The right S1 tumor partial extends into the right S1 neural foramen and epidural space, surrounding the S1 nerve roots. The S2 tumor extends into the left S2 neural foramen and epidural space, adjacent to the left S2 nerve roots.  There is a large cystic lesion in the right hepatic lobe posteriorly.  Despite efforts by the technologist and patient, motion artifact is present on today's exam and could not be eliminated. This reduces exam sensitivity and specificity. Additional findings at individual levels are as follows:  T11-12: Mild right eccentric central narrowing of the thecal sac due to a right paracentral disc protrusion.  T12-L1:  Unremarkable.  L1-2: No impingement. Tumor observed in the left L2 posterior elements.  L2-3: Tumor extends inferiorly in the right neural foramen and surrounds the right L2 nerve roots and spinal nerve. Right eccentric epidural tumor arising from L3. Tumor in the right lateral recess.  L3-4: Moderate right foraminal stenosis due to spurring, disc bulge, and facet arthropathy. Mild central narrowing of  the thecal sac.  L4-5: Mild bilateral subarticular lateral recess stenosis due to facet arthropathy. Prior laminectomies. Borderline left foraminal stenosis due to facet and intervertebral spurring.  L5-S1:  No impingement.  Prior laminectomies at L5.   IMPRESSION: 1. Unfortunately there are enhancing tumors in most of the lumbar vertebra, the sacrum, paraspinal tissues, subcutaneous tissues, retroperitoneum, and perirenal spaces most compatible with a diffuse metastatic process. Oncology referral with staging workup to determine a suitable biopsy site recommended. 2. Cortical breakthrough from the L3 vertebral body on the right side leads to epidural tumor and tumor in the right neural foramen at L2-3. There is also involvement of the S1 and S2 neural foramina by tumor and sacral epidural tumor as noted above. 3. Spondylosis and degenerative disc disease also cause moderate impingement at L3-4 and mild impingement at T11-12 and L4-5, as noted above. These results were called by telephone at the time of interpretation on 07/13/2014 at 10:00 am to Dr. Reather Converse, who verbally acknowledged these results.   Electronically Signed   By: Sherryl Barters M.D.   On: 07/13/2014 10:07   Lumbar tumor, back pain  Mariea Clonts, MD 07/13/14 1136

## 2014-07-13 NOTE — ED Provider Notes (Signed)
CSN: [id number]     Arrival date & time 07/13/14  4098 History   First MD Initiated Contact with Patient 07/13/14 0321     Chief Complaint  Patient presents with  . Hip Pain  . Back Pain     (Consider location/radiation/quality/duration/timing/severity/associated sxs/prior Treatment) HPI Comments: Pt is 63 y/o male with hx of lower back surgery "8 or 9 years ago".  He states that he had recurrent back pain that started 2 weeks ago - he denies an inciting event but since the pain has been worsening - he was seen several days ago and had L spine radiographs without acute source- improved on meds and was d/c home - saw Dr. Cristina Gong yesterday for EGD but has had worsening back pain causing him to come back to ED for evaluation - he got up this morning to use the bathroom and was unable to ambulate to the bathroom due to severe R lower back pain radiating into the R buttock and the R thigh / hip.  Constant, worse with movement and change in position but present at rest and now he states that he can't find a comfortable position.  He has no urinary sx and denies numbness of the legs or perineum except for the R foot which has been "asleep " for 2 weeks.  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 No dysuria, no hematuria, no abd pain, no sob, cp, diarrhea.  Patient is a 63 y.o. male presenting with hip pain and back pain. The history is provided by the patient and medical records.  Hip Pain  Back Pain   Past Medical History  Diagnosis Date  . Chest pain     previous CP thought r/t GI; mild CAD by cath 2002  . Cancer     of the eye  . Hypertension     pt denies h/o htn  . COPD (chronic obstructive pulmonary disease)   . Hyperlipidemia     taken off statin 1 mo ago by primary care bc he was "put on a new med that might interfere"  . Tobacco abuse   . Neuropathy of foot     "nerve  damage in bilat feet"  . Gout   . Shortness of breath   . Syncope    Past Surgical History  Procedure Laterality Date  . Shoulder surgery    . Neck surgery    . Eye surgery      cancer  . Spine surgery    . Knee cartilage surgery    . Cholecystectomy     Family History  Problem Relation Age of Onset  . Heart attack Mother 32  . Heart attack Brother 79  . Diabetes Mother   . Multiple sclerosis Brother   . Diabetes Sister   . Alzheimer's disease Maternal Grandmother   . Diabetes Maternal Grandmother   . Seizures Father    History  Substance Use Topics  . Smoking status: Smoker, Current Status Unknown -- 0.50 packs/day for 20 years    Types: Cigarettes  . Smokeless tobacco: Never Used  . Alcohol Use: No    Review of Systems  Musculoskeletal: Positive for back pain.  All other systems reviewed and are negative.     Allergies  Demerol and Meperidine hcl  Home Medications   Prior to Admission medications   Medication Sig Start Date End Date  Taking? Authorizing Provider  Aclidinium Bromide 400 MCG/ACT AEPB Inhale 1 puff into the lungs 2 (two) times daily.   Yes Historical Provider, MD  albuterol (PROVENTIL HFA;VENTOLIN HFA) 108 (90 BASE) MCG/ACT inhaler Inhale 2 puffs into the lungs every 6 (six) hours as needed for wheezing or shortness of breath.   Yes Historical Provider, MD  budesonide-formoterol (SYMBICORT) 160-4.5 MCG/ACT inhaler Inhale 2 puffs into the lungs at bedtime.   Yes Historical Provider, MD  diphenhydrAMINE (SOMINEX) 25 MG tablet Take 25 mg by mouth at bedtime as needed for sleep.    Yes Historical Provider, MD  fentaNYL (DURAGESIC - DOSED MCG/HR) 75 MCG/HR Place 75 mcg onto the skin every 3 (three) days.    Yes Historical Provider, MD  ibuprofen (ADVIL,MOTRIN) 200 MG tablet Take 400 mg by mouth every 6 (six) hours as needed for moderate pain.   Yes Historical Provider, MD  LORazepam (ATIVAN) 0.5 MG tablet Take 1 mg by mouth 2 (two) times daily as needed  for anxiety.   Yes Historical Provider, MD  montelukast (SINGULAIR) 10 MG tablet Take 10 mg by mouth every morning.    Yes Historical Provider, MD  omeprazole (PRILOSEC) 20 MG capsule Take 20 mg by mouth every morning.   Yes Historical Provider, MD  oxyCODONE (OXYCONTIN) 40 MG 12 hr tablet Take 40 mg by mouth at bedtime.    Yes Historical Provider, MD  Oxycodone HCl (ROXICODONE) 10 MG TABS Take 1-2 tablets (10-20 mg total) by mouth every 4 (four) hours as needed for severe pain. 07/08/14  Yes Ephraim Hamburger, MD  pantoprazole (PROTONIX) 40 MG tablet Take 40 mg by mouth every morning.    Yes Historical Provider, MD  PARoxetine (PAXIL) 10 MG tablet Take 10 mg by mouth every morning.   Yes Historical Provider, MD  predniSONE (DELTASONE) 5 MG tablet Take 7.5 mg by mouth daily with breakfast.   Yes Historical Provider, MD  promethazine (PHENERGAN) 25 MG tablet Take 25 mg by mouth every 8 (eight) hours as needed for nausea or vomiting.   Yes Historical Provider, MD   BP 165/93  Pulse 94  Temp(Src) 97.8 F (36.6 C) (Oral)  Resp 18  SpO2 94% Physical Exam  Nursing note and vitals reviewed. Constitutional: He appears well-developed and well-nourished.  uncomfortable  HENT:  Head: Normocephalic and atraumatic.  Mouth/Throat: Oropharynx is clear and moist. No oropharyngeal exudate.  Eyes: Conjunctivae and EOM are normal. Pupils are equal, round, and reactive to light. Right eye exhibits no discharge. Left eye exhibits no discharge. No scleral icterus.  Neck: Normal range of motion. Neck supple. No JVD present. No thyromegaly present.  Cardiovascular: Normal rate, regular rhythm, normal heart sounds and intact distal pulses.  Exam reveals no gallop and no friction rub.   No murmur heard. Pulmonary/Chest: Effort normal and breath sounds normal. No respiratory distress. He has no wheezes. He has no rales.  Abdominal: Soft. Bowel sounds are normal. He exhibits no distension and no mass. There is no  tenderness.  Genitourinary:  Normal appearing penis / testicles / scrotum - normal sensation as well  Musculoskeletal: Normal range of motion. He exhibits tenderness ( ttp in the RLB and ttp in the R buttock and proximal posterior thigh). He exhibits no edema.  No pain with straight leg raise on either side.  Normal ROM of hip and knee joints.  Lymphadenopathy:    He has no cervical adenopathy.  Neurological: He is alert. Coordination normal.  Able to straight leg  raise bilaterally - normal strength of muscle groups at the hips, knees and ankles.  Dec sensation of the R foot - pt claims stocking glove on exam.  Inconsistent exam with different parts with dec sensation at different times.   Skin: Skin is warm and dry. No rash noted. No erythema.  Psychiatric: He has a normal mood and affect. His behavior is normal.    ED Course  Procedures (including critical care time) Labs Review Labs Reviewed  CBC - Abnormal; Notable for the following:    RBC 4.21 (*)    Hemoglobin 12.7 (*)    HCT 37.5 (*)    All other components within normal limits  URINALYSIS, ROUTINE W REFLEX MICROSCOPIC - Abnormal; Notable for the following:    APPearance CLOUDY (*)    All other components within normal limits  BASIC METABOLIC PANEL    Imaging Review No results found.    MDM   Final diagnoses:  None    Pt with ongoing back pain - he has increased risk for spinal pathology given his age, worsening pain over 2 weeks and R foot numbness.  Pain control, check post void residual.    Pt awaiting MRI - pain improved but recurrs  Change of shift, care signed out to Dr. Reather Converse.  Johnna Acosta, MD 07/13/14 [phone]

## 2014-07-13 NOTE — ED Notes (Signed)
Attempted to call floor to give report and no one answered phone.

## 2014-07-13 NOTE — ED Notes (Signed)
Bed: AS50 Expected date:  Expected time:  Means of arrival:  Comments: EMS 77M back pain/R hip pain x several months

## 2014-07-13 NOTE — ED Notes (Signed)
Pt was unable to ambulate.  Pt pulled himself to the seated position.  Pt began to stand upright and returned to the bed stating the pain was too great.

## 2014-07-13 NOTE — Progress Notes (Addendum)
Patient ID: Nicholas Soto, male   DOB: [birth year], 63 y.o.   MRN: [id number]   Aware of request for biopsy per Dr Rockne Menghini Imaging pending  Dr Kathlene Cote will review any imaging 7/19 Will determine bx site then---plan poss bx  for Mon 7/20  PA will see in am

## 2014-07-13 NOTE — Progress Notes (Signed)
Patient came to unit at this time.Patient was in a lot of pain to lower back. Placed comfortably in bed. Will continue to monitor the patient. - Sandie Ano

## 2014-07-13 NOTE — H&P (Addendum)
History and Physical:    Nicholas Soto TSV:[id number] DOB: [birth year] DOA: 07/13/2014  Referring physician: Dr. Reather Converse PCP: [name], MD  Chief Complaint: 2 month history of worsening back pain radiating to testicles.  History of Present Illness:   Nicholas Soto is an 63 y.o. 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  He say PCP examined him multiple times but did not order any diagnostic testing and ultimately insisted on being referred to a neurologist or neurosurgeon. The patient was seen at the [facility] 07/08/14 and had plain films of his lumbar spine which showed mild multilevel DDD and moderate facet arthropathy, but no evidence of acute abnormality.  He reports weight loss and nausea and vomiting, for which he saw Dr. Cristina Gong who ordered an UGI series and did a rectal exam.  He subsequently was placed on anti-reflux medication with some improvement in his symptoms  The patient says he had a MRI on his back a year or two ago that was reportedly normal.  The pain was so severe last night, he urinated on himself because he could not ambulate to the bathroom, so EMS was called and he was brought to the hospital where an MRI showed multiple tumors.  The patient reports a 4 month history of urinary hesitancy and weak urine stream, a 30 lb weight loss in the past 6 weeks, ongoing 1 PPD tobacco abuse.  ROS:   Constitutional: No fever, + chills;  Appetite diminished; + 30 lb weight loss, + fatigue.  HEENT: No blurry vision, no diplopia, no pharyngitis, no dysphagia CV: No chest pain, no palpitations, no PND, no orthopnea, no edema.  Resp: + chronic SOB, + cough, no pleuritic pain. GI: + recent nausea, + recent vomiting, no diarrhea, no melena, no hematochezia, + constipation, no abdominal pain.  GU: + dysuria, no  hematuria, no frequency, + urgency + weak urine stream/hestiancy. MSK: no myalgias, + severe back arthralgias.  Neuro:  No headache, no focal neurological deficits, no history of seizures, + foot paresthesias.  Psych: No depression, + anxiety.  Endo: No heat intolerance, no cold intolerance, no polyuria, + polydipsia  Skin: No rashes, no skin lesions, h/o psoriasis.  Heme: No easy bruising.  Travel history: [place], [place], [place].   Past Medical History:   Past Medical History  Diagnosis Date  . Chest pain     previous CP thought r/t GI; mild CAD by cath 2002  . Cancer Left    of the eye  . Hypertension     pt denies h/o htn  . COPD (chronic obstructive pulmonary disease)   . Hyperlipidemia     taken off statin 1 mo ago by primary care bc he was "put on a new med that might interfere"  . Tobacco abuse   . 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 Gout   . Shortness of breath   . Syncope     Past Surgical History:   Past Surgical History  Procedure Laterality Date  . Shoulder surgery    . Neck surgery    . Eye surgery      cancer  . Spine surgery    . Knee cartilage surgery    . Cholecystectomy      Social History:  History   Social History  . Marital Status: Married    Spouse Name: Terri    Number of Children: 3  . Years of Education: N/A   Occupational History  . Disabled.     retired from [employer]  . Smoking status: Smoker, Current Status Unknown -- 0.50 packs/day for 20 years    Types: Cigarettes  . Smokeless tobacco: Never Used  . Alcohol Use: No  . Drug Use: No  . Sexual Activity: Yes   Other Topics Concern  . Not on file   Social History Narrative   Married.  Disabled.  Ambulates independently at baseline.  3 biological children, 1 adopted daughter.    Family history:   Family History  Problem Relation Age of Onset  . Heart attack Mother 86  . Heart attack Brother 46  . Diabetes  Mother   . Multiple sclerosis Brother   . Diabetes Sister   . Alzheimer's disease Maternal Grandmother   . Diabetes Maternal Grandmother   . Seizures Father     Allergies   Demerol and Meperidine hcl  Current Medications:   Prior to Admission medications   Medication Sig Start Date End Date Taking? Authorizing Provider  Aclidinium Bromide 400 MCG/ACT AEPB Inhale 1 puff into the lungs 2 (two) times daily.   Yes Historical Provider, MD  albuterol (PROVENTIL HFA;VENTOLIN HFA) 108 (90 BASE) MCG/ACT inhaler Inhale 2 puffs into the lungs every 6 (six) hours as needed for wheezing or shortness of breath.   Yes Historical Provider, MD  budesonide-formoterol (SYMBICORT) 160-4.5 MCG/ACT inhaler Inhale 2 puffs into the lungs at bedtime.   Yes Historical Provider, MD  diphenhydrAMINE (SOMINEX) 25 MG tablet Take 25 mg by mouth at bedtime as needed for sleep.    Yes Historical Provider, MD  fentaNYL (DURAGESIC - DOSED MCG/HR) 75 MCG/HR Place 75 mcg onto the skin every 3 (three) days.    Yes Historical Provider, MD  ibuprofen (ADVIL,MOTRIN) 200 MG tablet Take 400 mg by mouth every 6 (six) hours as needed for moderate pain.   Yes Historical Provider, MD  LORazepam (ATIVAN) 0.5 MG tablet Take 1 mg by mouth 2 (two) times daily as needed for anxiety.   Yes Historical Provider, MD  montelukast (SINGULAIR) 10 MG tablet Take 10 mg by mouth every morning.    Yes Historical Provider, MD  omeprazole (PRILOSEC) 20 MG capsule Take 20 mg by mouth every morning.   Yes Historical Provider, MD  oxyCODONE (OXYCONTIN) 40 MG 12 hr tablet Take 40 mg by mouth at bedtime.    Yes Historical Provider, MD  Oxycodone HCl (ROXICODONE) 10 MG TABS Take 1-2 tablets (10-20 mg total) by mouth every 4 (four) hours as needed for severe pain. 07/08/14  Yes Ephraim Hamburger, MD  pantoprazole (PROTONIX) 40 MG tablet Take 40 mg by mouth every morning.    Yes Historical Provider, MD  PARoxetine (PAXIL) 10 MG tablet Take 10 mg by mouth every  morning.   Yes Historical Provider, MD  predniSONE (DELTASONE) 5 MG tablet Take 7.5 mg by mouth daily with breakfast.   Yes Historical Provider, MD  promethazine (PHENERGAN) 25 MG tablet Take 25 mg by mouth every 8 (eight) hours as needed for nausea or vomiting.   Yes Historical Provider, MD    Physical Exam:   Filed Vitals:   07/13/14 0745 07/13/14 0959 07/13/14 1157 07/13/14 1215  BP: 163/93 131/69 153/69 135/66  Pulse: 91 93 87 86  Temp:    97.6 F (36.4 C)  TempSrc:    Oral  Resp:  16 20 18   Height:    5\' 9"  (1.753 m)  Weight:    74.662 kg (164 lb 9.6 oz)  SpO2: 94% 92% 92% 94%     Physical Exam: Blood pressure 135/66, pulse 86, temperature 97.6 F (36.4 C), temperature source Oral, resp. rate 18, height 5\' 9"  (1.753 m), weight 74.662 kg (164 lb 9.6 oz), SpO2 94.00%. Gen: Severe distress from back pain and burning type neuropathy pain in his legs. Head: Normocephalic, atraumatic. Eyes: PERRL right, pupillary defect left eye from prior surgery, EOMI, sclerae nonicteric. Mouth: Oropharynx clear with poor dentition. Neck: Supple, no thyromegaly, no lymphadenopathy, no jugular venous distention. Chest: Lungs with expiratory wheezes. CV: Heart sounds are regular, no murmurs, rubs, or gallops. Abdomen: Soft, nontender, nondistended with normal active bowel sounds. Extremities: Extremities are with trace edema. Skin: Warm and dry. Neuro: Alert and oriented times 3; cranial nerves II through XII grossly intact. Psych: Mood and affect anxious.   Data Review:    Labs: Basic Metabolic Panel:  Recent Labs Lab 07/08/14 1004 07/13/14 0342  NA 138 137  K 4.6 3.9  CL 99 96  CO2 26 28  GLUCOSE 92 96  BUN 18 15  CREATININE 0.82 0.77  CALCIUM 9.1 9.0   CBC:  Recent Labs Lab 07/08/14 1004 07/13/14 0342  WBC 9.1 9.1  HGB 13.3 12.7*  HCT 38.6* 37.5*  MCV 86.5 89.1  PLT 359 326    Radiographic Studies: Ct Chest W Contrast  07/13/2014   CLINICAL DATA:  Bone  metastases. Unknown primary. Past history of ocular melanoma.  EXAM: CT CHEST, ABDOMEN, AND PELVIS WITH CONTRAST  TECHNIQUE: Multidetector CT imaging of the chest, abdomen and pelvis was performed following the standard protocol during bolus administration of intravenous contrast.  CONTRAST:  149mL OMNIPAQUE IOHEXOL 300 MG/ML  SOLN  COMPARISON:  Chest CT on 11/10/2011  FINDINGS: CT CHEST FINDINGS  Bulky mediastinal lymphadenopathy is seen in the superior mediastinum, prevascular space, right paratracheal region, lateral aortic, and subcarinal regions. Largest index area of lymphadenopathy in the right paratracheal region measures 4.9 x 5.9 cm on image 18, and there is compression of the left brachycephalic vein without thrombosis.  Mild lymphadenopathy is also seen in the right lower jugular chain and supraclavicular regions as well as the right axilla. There is also mild adenopathy in the right and left pericardial spaces. , consistent metastatic disease.  A 7 mm pulmonary nodule is seen in the right middle lobe on image 34 and a 4 mm nodule is seen in the anterior left upper lobe on image 23. These nodules were not seen on previous study and tiny pulmonary metastases cannot be excluded. A 4 mm pulmonary nodule in the left lower lobe on image 45 is stable since previous study in 2012 and likely benign.  No evidence of pleural effusion although small subpleural metastases measuring up to 1 cm are seen bilaterally on images 29 and 28. In addition, there are small metastases seen in the subcutaneous tissues of the chest wall bilaterally. Several lytic bone metastases are seen involving the thoracic spine and right lateral sixth rib.  CT ABDOMEN AND PELVIS FINDINGS  Benign appearing cyst again seen in the posterior right hepatic lobe as well as a few other tiny sub-cm cyst which appears stable. No definite liver metastases are seen. There are several small ill-defined low-attenuation lesions in the spleen, suspicious  for splenic metastases. Kidneys and pancreas are unremarkable in appearance. No evidence of hydronephrosis.  Bilateral retrocrural lymphadenopathy demonstrated, consistent metastatic disease. There are numerous soft tissue nodules throughout the mesenteric and retroperitoneal fat throughout the abdomen pelvis, consistent with diffuse metastatic disease. Largest in the right posterior para renal space measures 4.6 x 6.0 cm on image 88.  There are also numerous metastatic soft tissue nodule seen throughout the abdominal and pelvic wall soft tissues bilaterally.  Numerous lytic bone metastases are seen involving the lumbar spine, pelvis, and hips.  No evidence of bowel obstruction. No evidence of inflammatory process, abscess, or ascites.  IMPRESSION: Bulky mediastinal lymphadenopathy with mild lymphadenopathy also seen in the right axilla, bilateral pericardial spaces, and retrocrural regions, consistent metastatic disease.  Diffuse body wall soft tissue metastases throughout the chest, abdomen, and pelvis.  Diffuse mesenteric and retroperitoneal metastases throughout the abdomen and pelvis.  Diffuse lytic bone metastases.  Probable small splenic metastases.   Electronically Signed   By: Earle Gell M.D.   On: 07/13/2014 15:20   Mr Lumbar Spine W Wo Contrast  07/13/2014   CLINICAL DATA:  Low back pain radiating to the right thigh and knee.  EXAM: MRI LUMBAR SPINE WITHOUT AND WITH CONTRAST  TECHNIQUE: Multiplanar and multiecho pulse sequences of the lumbar spine were obtained without and with intravenous contrast.  CONTRAST:  92mL MULTIHANCE GADOBENATE DIMEGLUMINE 529 MG/ML IV SOLN  COMPARISON:  07/08/2014  FINDINGS: Unfortunately there are numerous masses scattered in the lumbar spine, sacrum, retroperitoneum, perirenal spaces, paraspinal musculature, and even the subcutaneous tissues highly concerning for a diffusely metastatic process. Index lesions include a 3.9 cm mass along the right mid kidney medially which  may or may not be arising from the kidney and a right paraspinal mass at the L3 level which measures 4.2 cm in AP dimension along the deep margin of the psoas muscle.  The bony metastatic lesions are observed involving T11, T12, L2, L3, and L4, as well as the S1 and S2 levels of the sacrum. The L3 level demonstrates cortical breakthrough along the posterior vertebral margin eccentric to the right and extending into the right pedicle, with right epidural tumor and tumor extending in the inferior aspect of the right neural foramen. The right S1 tumor partial extends into the right S1 neural foramen and epidural space, surrounding the S1 nerve roots. The S2 tumor extends into the left S2 neural foramen and epidural space, adjacent to the left S2 nerve roots.  There is a large cystic lesion in the right hepatic lobe posteriorly.  Despite efforts by the technologist and patient, motion artifact is present on today's exam and could not be eliminated. This reduces exam sensitivity and specificity. Additional findings at individual levels are as follows:  T11-12: Mild right eccentric central narrowing of the thecal sac due to a right paracentral disc protrusion.  T12-L1:  Unremarkable.  L1-2: No impingement. Tumor observed in the left L2 posterior elements.  L2-3: Tumor extends inferiorly in the right neural foramen and surrounds the right L2 nerve roots and spinal nerve. Right eccentric epidural tumor arising from L3. Tumor in the right lateral recess.  L3-4: Moderate right foraminal stenosis due to spurring, disc bulge, and facet arthropathy. Mild central narrowing of the thecal sac.  L4-5: Mild bilateral subarticular lateral recess stenosis due to facet arthropathy. Prior laminectomies. Borderline left foraminal stenosis due to facet and intervertebral spurring.  L5-S1:  No impingement.  Prior laminectomies at L5.  IMPRESSION: 1. Unfortunately there  are enhancing tumors in most of the lumbar vertebra, the sacrum,  paraspinal tissues, subcutaneous tissues, retroperitoneum, and perirenal spaces most compatible with a diffuse metastatic process. Oncology referral with staging workup to determine a suitable biopsy site recommended. 2. Cortical breakthrough from the L3 vertebral body on the right side leads to epidural tumor and tumor in the right neural foramen at L2-3. There is also involvement of the S1 and S2 neural foramina by tumor and sacral epidural tumor as noted above. 3. Spondylosis and degenerative disc disease also cause moderate impingement at L3-4 and mild impingement at T11-12 and L4-5, as noted above. These results were called by telephone at the time of interpretation on 07/13/2014 at 10:00 am to Dr. Reather Converse, who verbally acknowledged these results.   Electronically Signed   By: Sherryl Barters M.D.   On: 07/13/2014 10:07   Ct Abdomen Pelvis W Contrast  07/13/2014   CLINICAL DATA:  Bone metastases. Unknown primary. Past history of ocular melanoma.  EXAM: CT CHEST, ABDOMEN, AND PELVIS WITH CONTRAST  TECHNIQUE: Multidetector CT imaging of the chest, abdomen and pelvis was performed following the standard protocol during bolus administration of intravenous contrast.  CONTRAST:  143mL OMNIPAQUE IOHEXOL 300 MG/ML  SOLN  COMPARISON:  Chest CT on 11/10/2011  FINDINGS: CT CHEST FINDINGS  Bulky mediastinal lymphadenopathy is seen in the superior mediastinum, prevascular space, right paratracheal region, lateral aortic, and subcarinal regions. Largest index area of lymphadenopathy in the right paratracheal region measures 4.9 x 5.9 cm on image 18, and there is compression of the left brachycephalic vein without thrombosis.  Mild lymphadenopathy is also seen in the right lower jugular chain and supraclavicular regions as well as the right axilla. There is also mild adenopathy in the right and left pericardial spaces. , consistent metastatic disease.  A 7 mm pulmonary nodule is seen in the right middle lobe on image 34 and  a 4 mm nodule is seen in the anterior left upper lobe on image 23. These nodules were not seen on previous study and tiny pulmonary metastases cannot be excluded. A 4 mm pulmonary nodule in the left lower lobe on image 45 is stable since previous study in 2012 and likely benign.  No evidence of pleural effusion although small subpleural metastases measuring up to 1 cm are seen bilaterally on images 29 and 28. In addition, there are small metastases seen in the subcutaneous tissues of the chest wall bilaterally. Several lytic bone metastases are seen involving the thoracic spine and right lateral sixth rib.  CT ABDOMEN AND PELVIS FINDINGS  Benign appearing cyst again seen in the posterior right hepatic lobe as well as a few other tiny sub-cm cyst which appears stable. No definite liver metastases are seen. There are several small ill-defined low-attenuation lesions in the spleen, suspicious for splenic metastases. Kidneys and pancreas are unremarkable in appearance. No evidence of hydronephrosis.  Bilateral retrocrural lymphadenopathy demonstrated, consistent metastatic disease. There are numerous soft tissue nodules throughout the mesenteric and retroperitoneal fat throughout the abdomen pelvis, consistent with diffuse metastatic disease. Largest in the right posterior para renal space measures 4.6 x 6.0 cm on image 88.  There are also numerous metastatic soft tissue nodule seen throughout the abdominal and pelvic wall soft tissues bilaterally.  Numerous lytic bone metastases are seen involving the lumbar spine, pelvis, and hips.  No evidence of bowel obstruction. No evidence of inflammatory process, abscess, or ascites.  IMPRESSION: Bulky mediastinal lymphadenopathy with mild lymphadenopathy also seen in the right  axilla, bilateral pericardial spaces, and retrocrural regions, consistent metastatic disease.  Diffuse body wall soft tissue metastases throughout the chest, abdomen, and pelvis.  Diffuse mesenteric and  retroperitoneal metastases throughout the abdomen and pelvis.  Diffuse lytic bone metastases.  Probable small splenic metastases.   Electronically Signed   By: Earle Gell M.D.   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  Decadron 4 mg by mouth every 6 hours ordered.  CT guided biopsy requested by IR.  CT scans of the chest, abdomen and pelvis ordered to further evaluate source of malignancy.  Pain control with Dilor, OxyContin, oxycodone, and Decadron. Also start Lyrica given his significant neuropathic pain.  Active Problems:   TOBACCO ABUSE  Nicotine patch ordered. Tobacco cessation counseling per nursing staff.    C O P D  Continue Symbicort, Spiriva and albuterol when necessary.    Unspecified hereditary and idiopathic peripheral neuropathy  Having severe burning pain in his legs. Start Lyrica.    Loss of weight  Cancer related cachexia. Dietitian consultation requested.    Anxiety state, unspecified  Ativan ordered as needed.    DVT prophylaxis  Lovenox ordered.  Code Status: Full. Family Communication: Wife, Nicholas Soto, [phone]). Disposition Plan: Home when stable.  Time spent: 70 minutes.  Bunnie Lederman [facility] Pager [phone] Cell: [phone]   If 7PM-7AM, please contact night-coverage [email or web address] Password [email or web address] 07/13/2014, 4:10 PM    **Disclaimer: This note was dictated with voice recognition software. Similar sounding words can inadvertently be transcribed and this note may contain transcription errors which may not have been corrected upon publication of note.**

## 2014-07-13 NOTE — Progress Notes (Signed)
Care notes on smoking cessation provided to patient. Patient not ready for to read the care notes r/t pain. Nicotine patch applied. Will endorse to night nurse.Sandie Ano RN

## 2014-07-13 NOTE — ED Notes (Signed)
Pt arrived to the ED with a complaint of chronic lower back pain that has now manifested itself in the right hip.  Pt states his back has gotten worse over the last several weeks.  Pt states that he has gone to both [facility] and [facility] for diagnostics but that those exams reveled nothing significant.  Pt does have pain pill prescriptions.  Pt has taken more than prescribed due to the pain.  Pt states bending his leg and knee cause excruciating pain.

## 2014-07-14 DIAGNOSIS — G609 Hereditary and idiopathic neuropathy, unspecified: Secondary | ICD-10-CM

## 2014-07-14 DIAGNOSIS — E43 Unspecified severe protein-calorie malnutrition: Secondary | ICD-10-CM | POA: Diagnosis present

## 2014-07-14 LAB — APTT: APTT: 38 s — AB (ref 24–37)

## 2014-07-14 LAB — PROTIME-INR
INR: 1.02 (ref 0.00–1.49)
PROTHROMBIN TIME: 13.4 s (ref 11.6–15.2)

## 2014-07-14 MED ORDER — FENTANYL 75 MCG/HR TD PT72
75.0000 ug | MEDICATED_PATCH | TRANSDERMAL | Status: DC
  Administered 2014-07-14 – 2014-07-17 (×2): 75 ug via TRANSDERMAL
  Filled 2014-07-14 (×2): qty 1

## 2014-07-14 MED ORDER — ENSURE COMPLETE PO LIQD: 237.0000 mL | Freq: Two times a day (BID) | ORAL | Status: DC

## 2014-07-14 MED ORDER — ENSURE COMPLETE PO LIQD
237.0000 mL | Freq: Three times a day (TID) | ORAL | Status: DC
  Administered 2014-07-15 – 2014-07-22 (×14): 237 mL via ORAL

## 2014-07-14 NOTE — Progress Notes (Addendum)
Progress Note   [name] VVO:[id number] DOB: [birth year] DOA: 07/13/2014 PCP: [name], MD   Brief Narrative:   Nicholas Soto is an 63 y.o. 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 S/P staging evaluation with CT-guided biopsy of one of his many lesions planned for 07/15/14.   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  Continue Decadron 4 mg by mouth every 6 hours.  CT guided biopsy requested by IR. Followup PSA. CT scans of the chest, abdomen and pelvis done with findings as noted below. No obvious primary. Pain control with Dilaudid-HP, OxyContin, oxycodone, Lyrica and Decadron. Pain much better controlled today.  Active Problems:  TOBACCO ABUSE  Nicotine patch ordered. Tobacco cessation counseling per nursing staff.  C O P D  Continue Symbicort, Spiriva and albuterol when necessary.  Unspecified hereditary and idiopathic peripheral neuropathy  Burning pain in legs improved. Lyrica started.  Loss of weight / severe protein calorie malnutrition  Cancer related cachexia. Dietitian consultation requested.  Anxiety state, unspecified  Ativan ordered as needed.  DVT prophylaxis  Lovenox ordered.  Code Status: Full.  Family Communication: Wife, Karna Christmas, [phone]) updated at bedside.  Disposition Plan: Home when stable.    IV Access:    Peripheral IV   Procedures and diagnostic studies:    MRI lumbar spine 07/13/14:1. Unfortunately there are enhancing tumors in most of the lumbar vertebra, the sacrum, paraspinal tissues, subcutaneous tissues, retroperitoneum, and perirenal spaces most  compatible with a diffuse metastatic process. Oncology referral with staging workup to determine a suitable biopsy site recommended. 2. Cortical breakthrough from the L3 vertebral body on the right side leads to epidural tumor and tumor in the right neural foramen at L2-3. There is also involvement of the S1 and S2 neural foramina by tumor and sacral epidural tumor as noted above. 3. Spondylosis and degenerative disc disease also cause moderate impingement at L3-4 and mild impingement at T11-12 and L4-5, as noted above.   CT of the chest, abdomen and pelvis 07/13/14: Bulky mediastinal lymphadenopathy with mild lymphadenopathy also seen in the right axilla, bilateral pericardial spaces, and retrocrural regions, consistent metastatic disease. Diffuse body wall soft tissue metastases throughout the chest, abdomen, and pelvis. Diffuse mesenteric and retroperitoneal metastases throughout the abdomen and pelvis. Diffuse lytic bone metastases. Probable small splenic metastases.    Medical Consultants:    Interventional radiology   Other Consultants:    None.   Anti-Infectives:    None.  Subjective:   Nicholas Soto reports that his pain is much better today. No nausea or vomiting. Affect brighter.  Objective:    Filed Vitals:   07/13/14 1215 07/13/14 1733 07/13/14 2309 07/14/14 0536  BP: 135/66 151/74 148/75 151/72  Pulse: 86 90 93 105  Temp: 97.6 F (36.4 C)  97.4 F (36.3 C) 97.5 F (36.4 C)  TempSrc: Oral  Oral Oral  Resp: 18 16 18 20   Height: 5\' 9"  (1.753 m)     Weight: 74.662 kg (164 lb 9.6 oz)     SpO2: 94% 93% 93% 93%    Intake/Output Summary (Last 24 hours) at 07/14/14 6270 Last data filed  at 07/14/14 0718  Gross per 24 hour  Intake    820 ml  Output   2650 ml  Net  -1830 ml    Exam: Gen:  NAD Cardiovascular:  Mildly tachycardic, No M/R/G Respiratory:  Lungs CTAB Gastrointestinal:  Abdomen soft, NT/ND, + BS Extremities:  No C/E/C   Data Reviewed:     Labs: Basic Metabolic Panel:  Recent Labs Lab 07/08/14 1004 07/13/14 0342  NA 138 137  K 4.6 3.9  CL 99 96  CO2 26 28  GLUCOSE 92 96  BUN 18 15  CREATININE 0.82 0.77  CALCIUM 9.1 9.0   GFR Estimated Creatinine Clearance: 93.3 ml/min (by C-G formula based on Cr of 0.77). Liver Function Tests:  Recent Labs Lab 07/13/14 0342  AST 20  ALT 11  ALKPHOS 127*  BILITOT 0.4  PROT 6.8  ALBUMIN 3.4*    CBC:  Recent Labs Lab 07/08/14 1004 07/13/14 0342  WBC 9.1 9.1  HGB 13.3 12.7*  HCT 38.6* 37.5*  MCV 86.5 89.1  PLT 359 326   Microbiology No results found for this or any previous visit (from the past 240 hour(s)).   Medications:   . antiseptic oral rinse  15 mL Mouth Rinse BID  . budesonide-formoterol  2 puff Inhalation QHS  . dexamethasone  4 mg Oral 4 times per day  . enoxaparin (LOVENOX) injection  40 mg Subcutaneous Q24H  . [START ON 07/15/2014] fentaNYL  75 mcg Transdermal Q72H  . montelukast  10 mg Oral q morning - 10a  . nicotine  21 mg Transdermal Daily  . OxyCODONE  40 mg Oral Q12H  . pantoprazole  40 mg Oral q morning - 10a  . PARoxetine  10 mg Oral q morning - 10a  . pregabalin  75 mg Oral BID  . senna  1 tablet Oral BID  . tiotropium  18 mcg Inhalation Daily   Continuous Infusions: . sodium chloride 50 mL/hr at 07/14/14 0518    Time spent: 35 minutes with > 50% of time discussing current diagnostic test results, clinical impression and plan of care.    LOS: 1 day   [name]  [facility] Pager [phone]. If unable to reach me by pager, please call my cell phone at [phone].  *Please refer to [email or web address], password [email or web address] to get updated schedule on who will round on this patient, as hospitalists switch teams weekly. If 7PM-7AM, please contact night-coverage at [email or web address], password [email or web address] for any overnight needs.  07/14/2014, 7:48 AM    **Disclaimer: This note was dictated with voice recognition software. Similar sounding  words can inadvertently be transcribed and this note may contain transcription errors which may not have been corrected upon publication of note.**

## 2014-07-14 NOTE — Progress Notes (Signed)
PT Cancellation Note  Patient Details Name: Nicholas Soto MRN: [id number] DOB: 1951-08-02   Cancelled Treatment:    Reason Eval/Treat Not Completed: Medical issues which prohibited therapy (Chart reviewed, noted widespread metastaticcancer. noted  Pain levels are high, for biopsy 07/15/14. will  check on pt after procedure.)   [name] 07/14/2014, 2:08 PM Tresa Endo PT [phone]

## 2014-07-14 NOTE — H&P (Signed)
Agree.  Unfortunately widespread metastatic disease.  Most accessible and safe mass for biopsy is largest mesenteric mass, located in RLQ near iliac fossa.  Will plan on CT guided biopsy of this mass tomorrow.

## 2014-07-14 NOTE — H&P (Signed)
Nicholas Soto is an 63 y.o. male.   Chief Complaint: Pt has suffered back pain and abd pain x months Admitted with intractable pain Comfortable now Hx Ocular Cancer 8 yrs ago Work up reveals many spinal lesions; mediastinal mass; mesenteric mass; lymphadenopathy [facility] has requested consult for biopsy Dr Kathlene Cote has reviewed imaging  Pt has been seen and examined Scheduled now for RLQ mesenteric mass biopsy  HPI: ocular ca; HTN; smoker-COPD; HLD  Past Medical History  Diagnosis Date  . Chest pain     previous CP thought r/t GI; mild CAD by cath 2002  . Cancer Left    of the eye  . Hypertension     pt denies h/o htn  . COPD (chronic obstructive pulmonary disease)   . Hyperlipidemia     taken off statin 1 mo ago by primary care bc he was "put on a new med that might interfere"  . Tobacco abuse   . 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 Gout   . Shortness of breath   . Syncope     Past Surgical History  Procedure Laterality Date  . Shoulder surgery    . Neck surgery    . Eye surgery      cancer  . Spine surgery    . Knee cartilage surgery    . Cholecystectomy      Family History  Problem Relation Age of Onset  . Heart attack Mother 44  . Heart attack Brother 83  . Diabetes Mother   . Multiple sclerosis Brother   . Diabetes Sister   . Alzheimer's disease Maternal Grandmother   . Diabetes Maternal Grandmother   . Seizures Father    Social History:  reports that he has been smoking Cigarettes.  He has a 10 pack-year smoking history. He has never used smokeless tobacco. He reports that he does not drink alcohol or use illicit drugs.  Allergies:  Allergies  Allergen Reactions  . Demerol [Meperidine] Nausea Only  . Meperidine Hcl Nausea And Vomiting    Medications Prior to Admission  Medication Dose Route Frequency Provider Last Rate Last Dose  . nitroGLYCERIN (NITROSTAT) SL tablet 0.3 mg  0.3 mg Sublingual Q5 Min x 3 PRN Mancel Bale, PA-C    0.3 mg at 03/08/12 1505   Medications Prior to Admission  Medication Sig Dispense Refill  . Aclidinium Bromide 400 MCG/ACT AEPB Inhale 1 puff into the lungs 2 (two) times daily.      Marland Kitchen albuterol (PROVENTIL HFA;VENTOLIN HFA) 108 (90 BASE) MCG/ACT inhaler Inhale 2 puffs into the lungs every 6 (six) hours as needed for wheezing or shortness of breath.      . budesonide-formoterol (SYMBICORT) 160-4.5 MCG/ACT inhaler Inhale 2 puffs into the lungs at bedtime.      . diphenhydrAMINE (SOMINEX) 25 MG tablet Take 25 mg by mouth at bedtime as needed for sleep.       . fentaNYL (DURAGESIC - DOSED MCG/HR) 75 MCG/HR Place 75 mcg onto the skin every 3 (three) days.       Marland Kitchen ibuprofen (ADVIL,MOTRIN) 200 MG tablet Take 400 mg by mouth every 6 (six) hours as needed for moderate pain.      Marland Kitchen LORazepam (ATIVAN) 0.5 MG tablet Take 1 mg by mouth 2 (two) times daily as needed for anxiety.      . montelukast (SINGULAIR) 10 MG tablet Take 10 mg by mouth every morning.       Marland Kitchen  omeprazole (PRILOSEC) 20 MG capsule Take 20 mg by mouth every morning.      Marland Kitchen oxyCODONE (OXYCONTIN) 40 MG 12 hr tablet Take 40 mg by mouth at bedtime.       . Oxycodone HCl (ROXICODONE) 10 MG TABS Take 1-2 tablets (10-20 mg total) by mouth every 4 (four) hours as needed for severe pain.  15 tablet  0  . pantoprazole (PROTONIX) 40 MG tablet Take 40 mg by mouth every morning.       Marland Kitchen PARoxetine (PAXIL) 10 MG tablet Take 10 mg by mouth every morning.      . predniSONE (DELTASONE) 5 MG tablet Take 7.5 mg by mouth daily with breakfast.      . promethazine (PHENERGAN) 25 MG tablet Take 25 mg by mouth every 8 (eight) hours as needed for nausea or vomiting.        Results for orders placed during the hospital encounter of 07/13/14 (from the past 48 hour(s))  CBC     Status: Abnormal   Collection Time    07/13/14  3:42 AM      Result Value Ref Range   WBC 9.1  4.0 - 10.5 K/uL   RBC 4.21 (*) 4.22 - 5.81 MIL/uL   Hemoglobin 12.7 (*) 13.0 - 17.0 g/dL    HCT 37.5 (*) 39.0 - 52.0 %   MCV 89.1  78.0 - 100.0 fL   MCH 30.2  26.0 - 34.0 pg   MCHC 33.9  30.0 - 36.0 g/dL   RDW 13.7  11.5 - 15.5 %   Platelets 326  150 - 400 K/uL  BASIC METABOLIC PANEL     Status: None   Collection Time    07/13/14  3:42 AM      Result Value Ref Range   Sodium 137  137 - 147 mEq/L   Potassium 3.9  3.7 - 5.3 mEq/L   Chloride 96  96 - 112 mEq/L   CO2 28  19 - 32 mEq/L   Glucose, Bld 96  70 - 99 mg/dL   BUN 15  6 - 23 mg/dL   Creatinine, Ser 0.77  0.50 - 1.35 mg/dL   Calcium 9.0  8.4 - 10.5 mg/dL   GFR calc non Af Amer >90  >90 mL/min   GFR calc Af Amer >90  >90 mL/min   Comment: (NOTE)     The eGFR has been calculated using the CKD EPI equation.     This calculation has not been validated in all clinical situations.     eGFR's persistently <90 mL/min signify possible Chronic Kidney     Disease.   Anion gap 13  5 - 15  HEPATIC FUNCTION PANEL     Status: Abnormal   Collection Time    07/13/14  3:42 AM      Result Value Ref Range   Total Protein 6.8  6.0 - 8.3 g/dL   Albumin 3.4 (*) 3.5 - 5.2 g/dL   AST 20  0 - 37 U/L   ALT 11  0 - 53 U/L   Alkaline Phosphatase 127 (*) 39 - 117 U/L   Total Bilirubin 0.4  0.3 - 1.2 mg/dL   Bilirubin, Direct <0.2  0.0 - 0.3 mg/dL   Indirect Bilirubin NOT CALCULATED  0.3 - 0.9 mg/dL  URINALYSIS, ROUTINE W REFLEX MICROSCOPIC     Status: Abnormal   Collection Time    07/13/14  5:02 AM      Result Value Ref Range  Color, Urine YELLOW  YELLOW   APPearance CLOUDY (*) CLEAR   Specific Gravity, Urine 1.009  1.005 - 1.030   pH 6.5  5.0 - 8.0   Glucose, UA NEGATIVE  NEGATIVE mg/dL   Hgb urine dipstick NEGATIVE  NEGATIVE   Bilirubin Urine NEGATIVE  NEGATIVE   Ketones, ur NEGATIVE  NEGATIVE mg/dL   Protein, ur NEGATIVE  NEGATIVE mg/dL   Urobilinogen, UA 1.0  0.0 - 1.0 mg/dL   Nitrite NEGATIVE  NEGATIVE   Leukocytes, UA NEGATIVE  NEGATIVE   Comment: MICROSCOPIC NOT DONE ON URINES WITH NEGATIVE PROTEIN, BLOOD, LEUKOCYTES,  NITRITE, OR GLUCOSE <1000 mg/dL.   Ct Chest W Contrast  07/13/2014   CLINICAL DATA:  Bone metastases. Unknown primary. Past history of ocular melanoma.  EXAM: CT CHEST, ABDOMEN, AND PELVIS WITH CONTRAST  TECHNIQUE: Multidetector CT imaging of the chest, abdomen and pelvis was performed following the standard protocol during bolus administration of intravenous contrast.  CONTRAST:  12m OMNIPAQUE IOHEXOL 300 MG/ML  SOLN  COMPARISON:  Chest CT on 11/10/2011  FINDINGS: CT CHEST FINDINGS  Bulky mediastinal lymphadenopathy is seen in the superior mediastinum, prevascular space, right paratracheal region, lateral aortic, and subcarinal regions. Largest index area of lymphadenopathy in the right paratracheal region measures 4.9 x 5.9 cm on image 18, and there is compression of the left brachycephalic vein without thrombosis.  Mild lymphadenopathy is also seen in the right lower jugular chain and supraclavicular regions as well as the right axilla. There is also mild adenopathy in the right and left pericardial spaces. , consistent metastatic disease.  A 7 mm pulmonary nodule is seen in the right middle lobe on image 34 and a 4 mm nodule is seen in the anterior left upper lobe on image 23. These nodules were not seen on previous study and tiny pulmonary metastases cannot be excluded. A 4 mm pulmonary nodule in the left lower lobe on image 45 is stable since previous study in 2012 and likely benign.  No evidence of pleural effusion although small subpleural metastases measuring up to 1 cm are seen bilaterally on images 29 and 28. In addition, there are small metastases seen in the subcutaneous tissues of the chest wall bilaterally. Several lytic bone metastases are seen involving the thoracic spine and right lateral sixth rib.  CT ABDOMEN AND PELVIS FINDINGS  Benign appearing cyst again seen in the posterior right hepatic lobe as well as a few other tiny sub-cm cyst which appears stable. No definite liver metastases are  seen. There are several small ill-defined low-attenuation lesions in the spleen, suspicious for splenic metastases. Kidneys and pancreas are unremarkable in appearance. No evidence of hydronephrosis.  Bilateral retrocrural lymphadenopathy demonstrated, consistent metastatic disease. There are numerous soft tissue nodules throughout the mesenteric and retroperitoneal fat throughout the abdomen pelvis, consistent with diffuse metastatic disease. Largest in the right posterior para renal space measures 4.6 x 6.0 cm on image 88.  There are also numerous metastatic soft tissue nodule seen throughout the abdominal and pelvic wall soft tissues bilaterally.  Numerous lytic bone metastases are seen involving the lumbar spine, pelvis, and hips.  No evidence of bowel obstruction. No evidence of inflammatory process, abscess, or ascites.  IMPRESSION: Bulky mediastinal lymphadenopathy with mild lymphadenopathy also seen in the right axilla, bilateral pericardial spaces, and retrocrural regions, consistent metastatic disease.  Diffuse body wall soft tissue metastases throughout the chest, abdomen, and pelvis.  Diffuse mesenteric and retroperitoneal metastases throughout the abdomen and pelvis.  Diffuse lytic  bone metastases.  Probable small splenic metastases.   Electronically Signed   By: Earle Gell M.D.   On: 07/13/2014 15:20   Mr Lumbar Spine W Wo Contrast  07/13/2014   CLINICAL DATA:  Low back pain radiating to the right thigh and knee.  EXAM: MRI LUMBAR SPINE WITHOUT AND WITH CONTRAST  TECHNIQUE: Multiplanar and multiecho pulse sequences of the lumbar spine were obtained without and with intravenous contrast.  CONTRAST:  60m MULTIHANCE GADOBENATE DIMEGLUMINE 529 MG/ML IV SOLN  COMPARISON:  07/08/2014  FINDINGS: Unfortunately there are numerous masses scattered in the lumbar spine, sacrum, retroperitoneum, perirenal spaces, paraspinal musculature, and even the subcutaneous tissues highly concerning for a diffusely  metastatic process. Index lesions include a 3.9 cm mass along the right mid kidney medially which may or may not be arising from the kidney and a right paraspinal mass at the L3 level which measures 4.2 cm in AP dimension along the deep margin of the psoas muscle.  The bony metastatic lesions are observed involving T11, T12, L2, L3, and L4, as well as the S1 and S2 levels of the sacrum. The L3 level demonstrates cortical breakthrough along the posterior vertebral margin eccentric to the right and extending into the right pedicle, with right epidural tumor and tumor extending in the inferior aspect of the right neural foramen. The right S1 tumor partial extends into the right S1 neural foramen and epidural space, surrounding the S1 nerve roots. The S2 tumor extends into the left S2 neural foramen and epidural space, adjacent to the left S2 nerve roots.  There is a large cystic lesion in the right hepatic lobe posteriorly.  Despite efforts by the technologist and patient, motion artifact is present on today's exam and could not be eliminated. This reduces exam sensitivity and specificity. Additional findings at individual levels are as follows:  T11-12: Mild right eccentric central narrowing of the thecal sac due to a right paracentral disc protrusion.  T12-L1:  Unremarkable.  L1-2: No impingement. Tumor observed in the left L2 posterior elements.  L2-3: Tumor extends inferiorly in the right neural foramen and surrounds the right L2 nerve roots and spinal nerve. Right eccentric epidural tumor arising from L3. Tumor in the right lateral recess.  L3-4: Moderate right foraminal stenosis due to spurring, disc bulge, and facet arthropathy. Mild central narrowing of the thecal sac.  L4-5: Mild bilateral subarticular lateral recess stenosis due to facet arthropathy. Prior laminectomies. Borderline left foraminal stenosis due to facet and intervertebral spurring.  L5-S1:  No impingement.  Prior laminectomies at L5.   IMPRESSION: 1. Unfortunately there are enhancing tumors in most of the lumbar vertebra, the sacrum, paraspinal tissues, subcutaneous tissues, retroperitoneum, and perirenal spaces most compatible with a diffuse metastatic process. Oncology referral with staging workup to determine a suitable biopsy site recommended. 2. Cortical breakthrough from the L3 vertebral body on the right side leads to epidural tumor and tumor in the right neural foramen at L2-3. There is also involvement of the S1 and S2 neural foramina by tumor and sacral epidural tumor as noted above. 3. Spondylosis and degenerative disc disease also cause moderate impingement at L3-4 and mild impingement at T11-12 and L4-5, as noted above. These results were called by telephone at the time of interpretation on 07/13/2014 at 10:00 am to Dr. ZReather Converse who verbally acknowledged these results.   Electronically Signed   By: WSherryl BartersM.D.   On: 07/13/2014 10:07   Ct Abdomen Pelvis W Contrast  07/13/2014  CLINICAL DATA:  Bone metastases. Unknown primary. Past history of ocular melanoma.  EXAM: CT CHEST, ABDOMEN, AND PELVIS WITH CONTRAST  TECHNIQUE: Multidetector CT imaging of the chest, abdomen and pelvis was performed following the standard protocol during bolus administration of intravenous contrast.  CONTRAST:  160m OMNIPAQUE IOHEXOL 300 MG/ML  SOLN  COMPARISON:  Chest CT on 11/10/2011  FINDINGS: CT CHEST FINDINGS  Bulky mediastinal lymphadenopathy is seen in the superior mediastinum, prevascular space, right paratracheal region, lateral aortic, and subcarinal regions. Largest index area of lymphadenopathy in the right paratracheal region measures 4.9 x 5.9 cm on image 18, and there is compression of the left brachycephalic vein without thrombosis.  Mild lymphadenopathy is also seen in the right lower jugular chain and supraclavicular regions as well as the right axilla. There is also mild adenopathy in the right and left pericardial spaces. ,  consistent metastatic disease.  A 7 mm pulmonary nodule is seen in the right middle lobe on image 34 and a 4 mm nodule is seen in the anterior left upper lobe on image 23. These nodules were not seen on previous study and tiny pulmonary metastases cannot be excluded. A 4 mm pulmonary nodule in the left lower lobe on image 45 is stable since previous study in 2012 and likely benign.  No evidence of pleural effusion although small subpleural metastases measuring up to 1 cm are seen bilaterally on images 29 and 28. In addition, there are small metastases seen in the subcutaneous tissues of the chest wall bilaterally. Several lytic bone metastases are seen involving the thoracic spine and right lateral sixth rib.  CT ABDOMEN AND PELVIS FINDINGS  Benign appearing cyst again seen in the posterior right hepatic lobe as well as a few other tiny sub-cm cyst which appears stable. No definite liver metastases are seen. There are several small ill-defined low-attenuation lesions in the spleen, suspicious for splenic metastases. Kidneys and pancreas are unremarkable in appearance. No evidence of hydronephrosis.  Bilateral retrocrural lymphadenopathy demonstrated, consistent metastatic disease. There are numerous soft tissue nodules throughout the mesenteric and retroperitoneal fat throughout the abdomen pelvis, consistent with diffuse metastatic disease. Largest in the right posterior para renal space measures 4.6 x 6.0 cm on image 88.  There are also numerous metastatic soft tissue nodule seen throughout the abdominal and pelvic wall soft tissues bilaterally.  Numerous lytic bone metastases are seen involving the lumbar spine, pelvis, and hips.  No evidence of bowel obstruction. No evidence of inflammatory process, abscess, or ascites.  IMPRESSION: Bulky mediastinal lymphadenopathy with mild lymphadenopathy also seen in the right axilla, bilateral pericardial spaces, and retrocrural regions, consistent metastatic disease.   Diffuse body wall soft tissue metastases throughout the chest, abdomen, and pelvis.  Diffuse mesenteric and retroperitoneal metastases throughout the abdomen and pelvis.  Diffuse lytic bone metastases.  Probable small splenic metastases.   Electronically Signed   By: JEarle GellM.D.   On: 07/13/2014 15:20    Review of Systems  Constitutional: Positive for weight loss. Negative for fever.  Respiratory: Negative for shortness of breath.   Cardiovascular: Positive for chest pain.  Gastrointestinal: Positive for abdominal pain. Negative for nausea and vomiting.  Genitourinary: Negative for hematuria.  Musculoskeletal: Positive for back pain and neck pain.  Neurological: Positive for weakness and headaches.  Psychiatric/Behavioral: Positive for substance abuse.       Smoker     Blood pressure 151/72, pulse 105, temperature 97.5 F (36.4 C), temperature source Oral, resp. rate 20, height 5'  9" (1.753 m), weight 74.662 kg (164 lb 9.6 oz), SpO2 93.00%. Physical Exam  Constitutional: He is oriented to person, place, and time. He appears well-developed.  Cardiovascular: Normal rate and regular rhythm.   No murmur heard. Respiratory: Effort normal and breath sounds normal. He has no wheezes.  GI: Soft.  Musculoskeletal: Normal range of motion. He exhibits tenderness.  Back pain  Neurological: He is alert and oriented to person, place, and time.  Skin: Skin is warm.  Psychiatric: He has a normal mood and affect. His behavior is normal. Judgment and thought content normal.     Assessment/Plan Hx ocular ca Months of abd and back pain MRI/CT reveals mediastinal mass; mesenteric mass; LAN and many spinal lesions Scheduled now for mesenteric mass bx 7/20  Pt aware of procedure benefits and risks and agreeable to proceed Consent signed andin chart   Jessalynn Mccowan A 07/14/2014, 9:03 AM

## 2014-07-14 NOTE — Progress Notes (Signed)
INITIAL NUTRITION ASSESSMENT  DOCUMENTATION CODES Per approved criteria  -Severe malnutrition in the context of chronic illness  Pt meets criteria for severe MALNUTRITION in the context of chronic illness as evidenced by 15% wt loss in 3 months and reported intake meeting <75% of estimated needs for > 1 month.  INTERVENTION: -Ensure Complete po TID, each supplement provides 350 kcal and 13 grams of protein - RD will continue to monitor for nutrition care plan  NUTRITION DIAGNOSIS: Inadequate oral intake related to nausea and vomiting as evidenced by reported intake less than estimated needs and wt loss.   Goal: Pt to meet >/= 90% of their estimated nutrition needs   Monitor:  Wt, po intake, acceptance of supplements, labs  Reason for Assessment: MST, Consult for wt loss  63 y.o. male  Admitting Dx: Lumbar spine tumor  ASSESSMENT: 63 y.o. 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  - Pt reports that he has experienced nausea and vomiting for the past several months which led to weight loss of about 25-30 lbs.  - Pt with no obvious signs of fat and muscle wasting, but pt reports that he feels that most of his muscle is gone.  - Reported that he had a good breakfast this am    Height: Ht Readings from Last 1 Encounters:  07/13/14 5\' 9"  (1.753 m)    Weight: Wt Readings from Last 1 Encounters:  07/13/14 164 lb 9.6 oz (74.662 kg)    Ideal Body Weight: 70.7 kg  % Ideal Body Weight: 102%  Wt Readings from Last 10 Encounters:  07/13/14 164 lb 9.6 oz (74.662 kg)  03/08/12 180 lb (81.647 kg)  02/03/12 182 lb (82.555 kg)  01/27/12 182 lb 5.1 oz (82.7 kg)  01/24/12 182 lb 5.1 oz (82.7 kg)  10/07/09 208 lb 6.1 oz (94.521 kg)  09/15/09 204 lb 8 oz (92.761 kg)    Usual Body Weight: 189 lbs, 3 mos ago  % Usual Body Weight:  87%  BMI:  Body mass index is 24.3 kg/(m^2).  Estimated Nutritional Needs: Kcal: 2000-2200 Protein: 100-110 g Fluid: ~2.0 L/day  Skin: Intact  Diet Order: General  EDUCATION NEEDS: -Education needs addressed   Intake/Output Summary (Last 24 hours) at 07/14/14 0939 Last data filed at 07/14/14 0718  Gross per 24 hour  Intake    820 ml  Output   2650 ml  Net  -1830 ml    Last BM: prior to admission   Labs:   Recent Labs Lab 07/08/14 1004 07/13/14 0342  NA 138 137  K 4.6 3.9  CL 99 96  CO2 26 28  BUN 18 15  CREATININE 0.82 0.77  CALCIUM 9.1 9.0  GLUCOSE 92 96    CBG (last 3)  No results found for this basename: GLUCAP,  in the last 72 hours  Scheduled Meds: . antiseptic oral rinse  15 mL Mouth Rinse BID  . budesonide-formoterol  2 puff Inhalation QHS  . dexamethasone  4 mg Oral 4 times per day  . enoxaparin (LOVENOX) injection  40 mg Subcutaneous Q24H  . fentaNYL  75 mcg Transdermal Q72H  . montelukast  10 mg Oral q morning - 10a  . nicotine  21 mg Transdermal Daily  . OxyCODONE  40 mg Oral Q12H  . pantoprazole  40 mg Oral q morning - 10a  .  PARoxetine  10 mg Oral q morning - 10a  . pregabalin  75 mg Oral BID  . senna  1 tablet Oral BID  . tiotropium  18 mcg Inhalation Daily    Continuous Infusions: . sodium chloride 50 mL/hr at 07/14/14 0518    Past Medical History  Diagnosis Date  . Chest pain     previous CP thought r/t GI; mild CAD by cath 2002  . Cancer Left    of the eye  . Hypertension     pt denies h/o htn  . COPD (chronic obstructive pulmonary disease)   . Hyperlipidemia     taken off statin 1 mo ago by primary care bc he was "put on a new med that might interfere"  . Tobacco abuse   . 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 Gout   . Shortness of breath   . Syncope     Past Surgical History  Procedure Laterality Date  . Shoulder surgery    . Neck surgery    . Eye surgery      cancer  . Spine surgery    .  Knee cartilage surgery    . Cholecystectomy      Terrace Arabia RD, LDN

## 2014-07-15 ENCOUNTER — Encounter (HOSPITAL_COMMUNITY): Payer: Self-pay | Admitting: Radiology

## 2014-07-15 ENCOUNTER — Ambulatory Visit (HOSPITAL_COMMUNITY): Payer: 59

## 2014-07-15 LAB — PSA: PSA: 2.25 ng/mL (ref <=4.00)

## 2014-07-15 LAB — FREE PSA
PSA FREE PCT: 13 % — AB (ref >25)
PSA FREE: 0.3 ng/mL

## 2014-07-15 MED ORDER — FENTANYL CITRATE 0.05 MG/ML IJ SOLN
INTRAMUSCULAR | Status: AC
  Filled 2014-07-15: qty 6

## 2014-07-15 MED ORDER — MIDAZOLAM HCL 2 MG/2ML IJ SOLN
INTRAMUSCULAR | Status: AC | PRN
  Administered 2014-07-15 (×2): 1 mg via INTRAVENOUS
  Administered 2014-07-15: 2 mg via INTRAVENOUS
  Administered 2014-07-15 (×2): 1 mg via INTRAVENOUS

## 2014-07-15 MED ORDER — HYDROMORPHONE HCL PF 2 MG/ML IJ SOLN
2.0000 mg | INTRAMUSCULAR | Status: DC | PRN
  Administered 2014-07-15 – 2014-07-19 (×25): 2 mg via INTRAVENOUS
  Filled 2014-07-15 (×27): qty 1

## 2014-07-15 MED ORDER — ENOXAPARIN SODIUM 40 MG/0.4ML ~~LOC~~ SOLN
40.0000 mg | SUBCUTANEOUS | Status: DC
  Administered 2014-07-16 – 2014-07-18 (×3): 40 mg via SUBCUTANEOUS
  Filled 2014-07-15 (×4): qty 0.4

## 2014-07-15 MED ORDER — FENTANYL CITRATE 0.05 MG/ML IJ SOLN
INTRAMUSCULAR | Status: AC | PRN
  Administered 2014-07-15 (×4): 50 ug via INTRAVENOUS

## 2014-07-15 MED ORDER — MIDAZOLAM HCL 2 MG/2ML IJ SOLN
INTRAMUSCULAR | Status: AC
  Filled 2014-07-15: qty 6

## 2014-07-15 MED ORDER — OXYCODONE HCL 5 MG PO TABS
10.0000 mg | ORAL_TABLET | ORAL | Status: DC | PRN
  Administered 2014-07-15 – 2014-07-17 (×3): 20 mg via ORAL
  Filled 2014-07-15 (×3): qty 4

## 2014-07-15 NOTE — Progress Notes (Signed)
Pt states he thinks he only has 2 months, just by the way the MD is talking to him.

## 2014-07-15 NOTE — Progress Notes (Signed)
PT Cancellation Note  Patient Details Name: Nicholas Soto MRN: [id number] DOB: 1951/09/26   Cancelled Treatment:    Reason Eval/Treat Not Completed: Patient at procedure or test/unavailable (post biopsy, drowsy per RN. Return in AM.)   Nicholas Soto 07/15/2014, 3:05 PM Tresa Endo PT [phone]

## 2014-07-15 NOTE — Progress Notes (Signed)
Patient ID: Nicholas Soto, male   DOB: [birth year], 63 y.o.   MRN: [id number] [facility] PROGRESS NOTE  Nicholas Soto LDJ:[id number] DOB: 11/19/1951 DOA: 07/13/2014 PCP: [name], MD  Brief narrative: 63 y.o. 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 S/P staging evaluation with CT-guided biopsy in progress.   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  Continue Decadron 4 mg by mouth every 6 hours.  CT guided biopsy requested and planned for today 7/20 CT scans of the chest, abdomen and pelvis done with findings as noted below. No obvious primary.  Pain control with Dilaudid-HP, OxyContin, oxycodone, Lyrica and Decadron. Pain much better controlled today. Active Problems:  TOBACCO ABUSE  Nicotine patch ordered. Tobacco cessation counseling per nursing staff. COPD Continue Symbicort, Spiriva and albuterol when necessary. Pt maintaining oxygen saturation at target range  Unspecified hereditary and idiopathic peripheral neuropathy  Burning pain in legs improved. Lyrica started. Loss of weight / severe protein calorie malnutrition  Cancer related cachexia. Dietitian consultation requested. Anxiety state, unspecified  Ativan ordered as needed. DVT prophylaxis  Lovenox ordered while inpatient   Consultants:  Interventional radiology  Procedures/Studies:  MRI lumbar spine 07/13/14:1. Unfortunately there are enhancing tumors in most of the lumbar vertebra, the sacrum, paraspinal tissues, subcutaneous tissues, retroperitoneum, and perirenal spaces most compatible with a diffuse metastatic process. Oncology  referral with staging workup to determine a suitable biopsy site recommended. 2. Cortical breakthrough from the L3 vertebral body on the right side leads to epidural tumor and tumor in the right neural foramen at L2-3. There is also involvement of the S1 and S2 neural foramina by tumor and sacral epidural tumor as noted above. 3. Spondylosis and degenerative disc disease also cause moderate impingement at L3-4 and mild impingement at T11-12 and L4-5, as noted above.   CT of the chest, abdomen and pelvis 07/13/14: Bulky mediastinal lymphadenopathy with mild lymphadenopathy also seen in the right axilla, bilateral pericardial spaces, and retrocrural regions, consistent metastatic disease. Diffuse body wall soft tissue metastases throughout the chest, abdomen, and pelvis. Diffuse mesenteric and retroperitoneal metastases throughout the abdomen and pelvis. Diffuse lytic bone metastases. Probable small splenic metastases.  Antibiotics:  None   Code Status: Full Family Communication: Pt and wife at bedside Disposition Plan: Home when medically stable  HPI/Subjective: No events overnight.   Objective: Filed Vitals:   07/15/14 1058 07/15/14 1115 07/15/14 1130 07/15/14 1145  BP: 102/55 106/58 107/58 109/58  Pulse: 74 70 73 70  Temp: 97.4 F (36.3 C) 97.3 F (36.3 C) 97.4 F (36.3 C) 97.2 F (36.2 C)  TempSrc: Oral Oral Oral Oral  Resp: 16 16 17 15   Height:      Weight:      SpO2: 96% 97% 98% 99%    Intake/Output Summary (Last 24 hours) at 07/15/14 1344 Last data filed at 07/15/14 0514  Gross per 24 hour  Intake      0 ml  Output    875 ml  Net   -875 ml    Exam:   General:  Pt is alert, follows commands appropriately, not in acute distress  Cardiovascular: Regular rate and rhythm,  S1/S2 appreciated   Respiratory: Clear to auscultation bilaterally, no wheezing, no crackles  Abdomen: Soft, non tender, non distended, bowel sounds present, no guarding  Extremities: No edema,  pulses DP and PT palpable bilaterally  Neuro: Grossly nonfocal  Data Reviewed: Basic Metabolic Panel:  Recent Labs Lab 07/13/14 0342  NA 137  K 3.9  CL 96  CO2 28  GLUCOSE 96  BUN 15  CREATININE 0.77  CALCIUM 9.0   Liver Function Tests:  Recent Labs Lab 07/13/14 0342  AST 20  ALT 11  ALKPHOS 127*  BILITOT 0.4  PROT 6.8  ALBUMIN 3.4*   CBC:  Recent Labs Lab 07/13/14 0342  WBC 9.1  HGB 12.7*  HCT 37.5*  MCV 89.1  PLT 326    Scheduled Meds: . budesonide-formoterol  2 puff Inhalation QHS  . dexamethasone  4 mg Oral 4 times per day  . enoxaparin (LOVENOX)   40 mg Subcutaneous Q24H  . feeding supplement   237 mL Oral TID BM  . fentaNYL  75 mcg Transdermal Q72H  . montelukast  10 mg Oral q morning - 10a  . nicotine  21 mg Transdermal Daily  . OxyCODONE  40 mg Oral Q12H  . pantoprazole  40 mg Oral q morning - 10a  . PARoxetine  10 mg Oral q morning - 10a  . pregabalin  75 mg Oral BID  . senna  1 tablet Oral BID  . tiotropium  18 mcg Inhalation Daily   Continuous Infusions: . sodium chloride 50 mL/hr at 07/14/14 0518     Faye Ramsay, MD  [facility] Pager [phone]  If 7PM-7AM, please contact night-coverage [email or web address] Password [email or web address] 07/15/2014, 1:44 PM   LOS: 2 days

## 2014-07-16 ENCOUNTER — Ambulatory Visit
Admit: 2014-07-16 | Discharge: 2014-07-16 | Disposition: A | Discharge status: Home / Self Care | Payer: 59 | Attending: Radiation Oncology | Admitting: Radiation Oncology

## 2014-07-16 DIAGNOSIS — C7952 Secondary malignant neoplasm of bone marrow: Secondary | ICD-10-CM

## 2014-07-16 DIAGNOSIS — C801 Malignant (primary) neoplasm, unspecified: Secondary | ICD-10-CM | POA: Insufficient documentation

## 2014-07-16 DIAGNOSIS — C7951 Secondary malignant neoplasm of bone: Secondary | ICD-10-CM | POA: Insufficient documentation

## 2014-07-16 DIAGNOSIS — Z510 Encounter for antineoplastic radiation therapy: Secondary | ICD-10-CM | POA: Insufficient documentation

## 2014-07-16 LAB — BASIC METABOLIC PANEL
ANION GAP: 10 (ref 5–15)
BUN: 22 mg/dL (ref 6–23)
CHLORIDE: 100 meq/L (ref 96–112)
CO2: 28 mEq/L (ref 19–32)
CREATININE: 0.83 mg/dL (ref 0.50–1.35)
Calcium: 8.8 mg/dL (ref 8.4–10.5)
GFR calc Af Amer: >90 mL/min (ref >90)
Glucose, Bld: 104 mg/dL — ABNORMAL HIGH (ref 70–99)
Potassium: 4.4 mEq/L (ref 3.7–5.3)
Sodium: 138 mEq/L (ref 137–147)

## 2014-07-16 LAB — CBC
HEMATOCRIT: 34.3 % — AB (ref 39.0–52.0)
Hemoglobin: 11.5 g/dL — ABNORMAL LOW (ref 13.0–17.0)
MCH: 29.6 pg (ref 26.0–34.0)
MCHC: 33.5 g/dL (ref 30.0–36.0)
MCV: 88.4 fL (ref 78.0–100.0)
PLATELETS: 289 10*3/uL (ref 150–400)
RBC: 3.88 MIL/uL — ABNORMAL LOW (ref 4.22–5.81)
RDW: 13.5 % (ref 11.5–15.5)
WBC: 8 10*3/uL (ref 4.0–10.5)

## 2014-07-16 LAB — TYPE AND SCREEN
ABO/RH(D): O NEG
Antibody Screen: NEGATIVE

## 2014-07-16 NOTE — Progress Notes (Signed)
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 male  Who presented to the emergency room from home with a 2 month history of back pain. He was referred to a pain clinic where he has been managed as an outpatient. Plain films performed on 05/30/14 did not reveal evidence of metastatic disease. He continued to complain of back pain and was eventually referred to a pain clinic. His pain he describes as starting in his back and radiating down his right leg into his testicle. He also complains of left cervical neck pain into his shoulder. He denies any headaches. He was incontinent of urine as he was not able to move to the bathroom fast enough and this is what brought him to the hospital this time. His back does not hurt at the moment and his pain to his leg/scrotum is bearable on his current pain medication. He is accompanied by his wife. He has a history of ocular melanoma treated definitively with RT.  He recalls his physician at [facility] telling him when he underwent radiation to his eye that "if this comes back it's going to be bad." He had a MRI of the lumbar spine on 7/18 shich showed widespread bony metastatic disease. Particularly worrisome was Cortical breakthrough from the L3 vertebral body on the right side lleading to epidural tumor and tumor in the right neural foramen at L2-3. There was also involvement of the S1 and S2 neural foramina  by tumor and sacral epidural tumor. Disease was mostly on the right side with the exception of the disease at S2 which was on the left. A CT of the C/A/P showed widespread disease in most lymph node regions, the subcutaneous tissues of the body and the pericardial space. A biopsy  yesterday of the right retroperitoneal soft tissue showed carcinoma. In discussing with pathology this is likely a melanoma or a high grade prostate cancer. Stains are pending. He endorses a 60 pound weight loss over the past 6 months.   PREVIOUS RADIATION THERAPY: Yes to the left eye  PAST MEDICAL HISTORY:  has a past medical history of Chest pain; Cancer (Left); Hypertension; COPD (chronic obstructive pulmonary disease); Hyperlipidemia; Tobacco abuse; Neuropathy of foot; Gout; Shortness of breath; and Syncope.    PAST SURGICAL HISTORY: Past Surgical History  Procedure Laterality Date  . Shoulder surgery    . Neck surgery    . Eye surgery      cancer  . Spine surgery    . Knee cartilage surgery    . Cholecystectomy      FAMILY HISTORY:  Family History  Problem Relation Age of Onset  . Heart attack Mother 31  . Heart attack Brother 36  . Diabetes Mother   . Multiple sclerosis Brother   . Diabetes Sister   . Alzheimer's disease Maternal Grandmother   . Diabetes Maternal Grandmother   . Seizures Father     SOCIAL HISTORY:  History  Substance Use Topics  . Smoking status: Smoker, Current Status Unknown -- 0.50 packs/day for 20 years    Types: Cigarettes  . Smokeless tobacco: Never Used  . Alcohol Use: No    ALLERGIES: Demerol and Meperidine hcl  MEDICATIONS:  Current Facility-Administered Medications  Medication  Dose Route Frequency Provider Last Rate Last Dose  . acetaminophen (TYLENOL) tablet 650 mg  650 mg Oral Q6H PRN Venetia Maxon Rama, MD       Or  . acetaminophen (TYLENOL) suppository 650 mg  650 mg Rectal Q6H PRN Christina P Rama, MD      . albuterol (PROVENTIL) (2.5 MG/3ML) 0.083% nebulizer solution 3 mL  3 mL Inhalation Q6H PRN Christina P Rama, MD      . alum & mag hydroxide-simeth (MAALOX/MYLANTA) 200-200-20 MG/5ML suspension 30 mL  30 mL Oral Q6H PRN Christina P Rama, MD      . antiseptic oral rinse (BIOTENE) solution 15 mL  15 mL Mouth Rinse BID Venetia Maxon  Rama, MD   15 mL at 07/16/14 0947  . budesonide-formoterol (SYMBICORT) 160-4.5 MCG/ACT inhaler 2 puff  2 puff Inhalation QHS Venetia Maxon Rama, MD   2 puff at 07/15/14 2014  . dexamethasone (DECADRON) tablet 4 mg  4 mg Oral 4 times per day Venetia Maxon Rama, MD   4 mg at 07/16/14 0551  . enoxaparin (LOVENOX) injection 40 mg  40 mg Subcutaneous Q24H Theodis Blaze, MD      . feeding supplement (ENSURE COMPLETE) (ENSURE COMPLETE) liquid 237 mL  237 mL Oral TID BM Dagmar Hait, RD   237 mL at 07/15/14 1354  . fentaNYL (DURAGESIC - dosed mcg/hr) 75 mcg  75 mcg Transdermal Q72H Venetia Maxon Rama, MD   75 mcg at 07/14/14 1031  . HYDROmorphone (DILAUDID) injection 2 mg  2 mg Intravenous Q2H PRN Theodis Blaze, MD   2 mg at 07/16/14 1037  . [START ON 07/18/2014] ibuprofen (ADVIL,MOTRIN) tablet 400 mg  400 mg Oral Q6H PRN Christina P Rama, MD      . ketorolac (TORADOL) 30 MG/ML injection 30 mg  30 mg Intravenous Q6H PRN Venetia Maxon Rama, MD   30 mg at 07/16/14 0854  . LORazepam (ATIVAN) tablet 1 mg  1 mg Oral Q6H PRN Venetia Maxon Rama, MD   1 mg at 07/16/14 0006  . montelukast (SINGULAIR) tablet 10 mg  10 mg Oral q morning - 10a Christina P Rama, MD   10 mg at 07/16/14 0946  . nicotine (NICODERM CQ - dosed in mg/24 hours) patch 21 mg  21 mg Transdermal Daily Venetia Maxon Rama, MD   21 mg at 07/16/14 0946  . nitroGLYCERIN (NITROSTAT) SL tablet 0.4 mg  0.4 mg Sublingual Q5 Min x 3 PRN Christina P Rama, MD      . ondansetron (ZOFRAN) tablet 4 mg  4 mg Oral Q6H PRN Christina P Rama, MD       Or  . ondansetron (ZOFRAN) injection 4 mg  4 mg Intravenous Q6H PRN Christina P Rama, MD      . oxyCODONE (Oxy IR/ROXICODONE) immediate release tablet 10-20 mg  10-20 mg Oral Q3H PRN Theodis Blaze, MD   20 mg at 07/15/14 1717  . oxyCODONE (OXYCONTIN) 12 hr tablet 40 mg  40 mg Oral Q12H Venetia Maxon Rama, MD   40 mg at 07/16/14 0946  . pantoprazole (PROTONIX) EC tablet 40 mg  40 mg Oral q morning - 10a Venetia Maxon Rama, MD   40 mg  at 07/16/14 0946  . PARoxetine (PAXIL) tablet 10 mg  10 mg Oral q morning - 10a Venetia Maxon Rama, MD   10 mg at 07/16/14 0946  . polyethylene glycol (MIRALAX / GLYCOLAX) packet 17 g  17 g Oral Daily PRN  Venetia Maxon Rama, MD      . pregabalin (LYRICA) capsule 75 mg  75 mg Oral BID Venetia Maxon Rama, MD   75 mg at 07/16/14 0946  . senna (SENOKOT) tablet 8.6 mg  1 tablet Oral BID Venetia Maxon Rama, MD   8.6 mg at 07/16/14 0946  . tiotropium (SPIRIVA) inhalation capsule 18 mcg  18 mcg Inhalation Daily Venetia Maxon Rama, MD   18 mcg at 07/15/14 0813  . zolpidem (AMBIEN) tablet 5 mg  5 mg Oral QHS PRN Venetia Maxon Rama, MD        REVIEW OF SYSTEMS:  A 15 point review of systems is documented in the electronic medical record. This was obtained by the nursing staff. However, I reviewed this with the patient to discuss relevant findings and make appropriate changes.  Pertinent items are noted in HPI.  PHYSICAL EXAM:  Filed Vitals:   07/16/14 0506  BP: 129/69  Pulse: 77  Temp: 97.9 F (36.6 C)  Resp: 14  .164 lb 9.6 oz (74.662 kg). Alert. Does not appear to be in pain. Good dorsi/plantar flexion. No back pain.   LABORATORY DATA:  Lab Results  Component Value Date   WBC 8.0 07/16/2014   HGB 11.5* 07/16/2014   HCT 34.3* 07/16/2014   MCV 88.4 07/16/2014   PLT 289 07/16/2014   Lab Results  Component Value Date   NA 138 07/16/2014   K 4.4 07/16/2014   CL 100 07/16/2014   CO2 28 07/16/2014   Lab Results  Component Value Date   ALT 11 07/13/2014   AST 20 07/13/2014   ALKPHOS 127* 07/13/2014   BILITOT 0.4 07/13/2014     RADIOGRAPHY: Dg Lumbar Spine Complete  07/08/2014   CLINICAL DATA:  63 year old male with low back pain radiating into both hips and legs. History of remote back surgery.  EXAM: LUMBAR SPINE - COMPLETE 4+ VIEW  COMPARISON:  05/30/2014 radiographs.  04/10/2008 MR  FINDINGS: Normal alignment noted.  Mild multilevel degenerative disc disease again identified.  Mild to moderate facet  arthropathy in the mid and lower lumbar spine is unchanged.  There is no evidence of acute fracture or subluxation.  No focal bony lesions are noted.  Postsurgical changes in the posterior lower lumbar spine identified.  IMPRESSION: No evidence of acute abnormality.  Mild multilevel degenerative disc disease and mild to moderate facet arthropathy.   Electronically Signed   By: Hassan Rowan M.D.   On: 07/08/2014 11:05   Ct Chest W Contrast  07/13/2014   CLINICAL DATA:  Bone metastases. Unknown primary. Past history of ocular melanoma.  EXAM: CT CHEST, ABDOMEN, AND PELVIS WITH CONTRAST  TECHNIQUE: Multidetector CT imaging of the chest, abdomen and pelvis was performed following the standard protocol during bolus administration of intravenous contrast.  CONTRAST:  174mL OMNIPAQUE IOHEXOL 300 MG/ML  SOLN  COMPARISON:  Chest CT on 11/10/2011  FINDINGS: CT CHEST FINDINGS  Bulky mediastinal lymphadenopathy is seen in the superior mediastinum, prevascular space, right paratracheal region, lateral aortic, and subcarinal regions. Largest index area of lymphadenopathy in the right paratracheal region measures 4.9 x 5.9 cm on image 18, and there is compression of the left brachycephalic vein without thrombosis.  Mild lymphadenopathy is also seen in the right lower jugular chain and supraclavicular regions as well as the right axilla. There is also mild adenopathy in the right and left pericardial spaces. , consistent metastatic disease.  A 7 mm pulmonary nodule is seen in the right middle  lobe on image 34 and a 4 mm nodule is seen in the anterior left upper lobe on image 23. These nodules were not seen on previous study and tiny pulmonary metastases cannot be excluded. A 4 mm pulmonary nodule in the left lower lobe on image 45 is stable since previous study in 2012 and likely benign.  No evidence of pleural effusion although small subpleural metastases measuring up to 1 cm are seen bilaterally on images 29 and 28. In addition,  there are small metastases seen in the subcutaneous tissues of the chest wall bilaterally. Several lytic bone metastases are seen involving the thoracic spine and right lateral sixth rib.  CT ABDOMEN AND PELVIS FINDINGS  Benign appearing cyst again seen in the posterior right hepatic lobe as well as a few other tiny sub-cm cyst which appears stable. No definite liver metastases are seen. There are several small ill-defined low-attenuation lesions in the spleen, suspicious for splenic metastases. Kidneys and pancreas are unremarkable in appearance. No evidence of hydronephrosis.  Bilateral retrocrural lymphadenopathy demonstrated, consistent metastatic disease. There are numerous soft tissue nodules throughout the mesenteric and retroperitoneal fat throughout the abdomen pelvis, consistent with diffuse metastatic disease. Largest in the right posterior para renal space measures 4.6 x 6.0 cm on image 88.  There are also numerous metastatic soft tissue nodule seen throughout the abdominal and pelvic wall soft tissues bilaterally.  Numerous lytic bone metastases are seen involving the lumbar spine, pelvis, and hips.  No evidence of bowel obstruction. No evidence of inflammatory process, abscess, or ascites.  IMPRESSION: Bulky mediastinal lymphadenopathy with mild lymphadenopathy also seen in the right axilla, bilateral pericardial spaces, and retrocrural regions, consistent metastatic disease.  Diffuse body wall soft tissue metastases throughout the chest, abdomen, and pelvis.  Diffuse mesenteric and retroperitoneal metastases throughout the abdomen and pelvis.  Diffuse lytic bone metastases.  Probable small splenic metastases.   Electronically Signed   By: Earle Gell M.D.   On: 07/13/2014 15:20   Mr Lumbar Spine W Wo Contrast  07/13/2014   CLINICAL DATA:  Low back pain radiating to the right thigh and knee.  EXAM: MRI LUMBAR SPINE WITHOUT AND WITH CONTRAST  TECHNIQUE: Multiplanar and multiecho pulse sequences of  the lumbar spine were obtained without and with intravenous contrast.  CONTRAST:  40mL MULTIHANCE GADOBENATE DIMEGLUMINE 529 MG/ML IV SOLN  COMPARISON:  07/08/2014  FINDINGS: Unfortunately there are numerous masses scattered in the lumbar spine, sacrum, retroperitoneum, perirenal spaces, paraspinal musculature, and even the subcutaneous tissues highly concerning for a diffusely metastatic process. Index lesions include a 3.9 cm mass along the right mid kidney medially which may or may not be arising from the kidney and a right paraspinal mass at the L3 level which measures 4.2 cm in AP dimension along the deep margin of the psoas muscle.  The bony metastatic lesions are observed involving T11, T12, L2, L3, and L4, as well as the S1 and S2 levels of the sacrum. The L3 level demonstrates cortical breakthrough along the posterior vertebral margin eccentric to the right and extending into the right pedicle, with right epidural tumor and tumor extending in the inferior aspect of the right neural foramen. The right S1 tumor partial extends into the right S1 neural foramen and epidural space, surrounding the S1 nerve roots. The S2 tumor extends into the left S2 neural foramen and epidural space, adjacent to the left S2 nerve roots.  There is a large cystic lesion in the right hepatic lobe posteriorly.  Despite efforts by the technologist and patient, motion artifact is present on today's exam and could not be eliminated. This reduces exam sensitivity and specificity. Additional findings at individual levels are as follows:  T11-12: Mild right eccentric central narrowing of the thecal sac due to a right paracentral disc protrusion.  T12-L1:  Unremarkable.  L1-2: No impingement. Tumor observed in the left L2 posterior elements.  L2-3: Tumor extends inferiorly in the right neural foramen and surrounds the right L2 nerve roots and spinal nerve. Right eccentric epidural tumor arising from L3. Tumor in the right lateral recess.   L3-4: Moderate right foraminal stenosis due to spurring, disc bulge, and facet arthropathy. Mild central narrowing of the thecal sac.  L4-5: Mild bilateral subarticular lateral recess stenosis due to facet arthropathy. Prior laminectomies. Borderline left foraminal stenosis due to facet and intervertebral spurring.  L5-S1:  No impingement.  Prior laminectomies at L5.  IMPRESSION: 1. Unfortunately there are enhancing tumors in most of the lumbar vertebra, the sacrum, paraspinal tissues, subcutaneous tissues, retroperitoneum, and perirenal spaces most compatible with a diffuse metastatic process. Oncology referral with staging workup to determine a suitable biopsy site recommended. 2. Cortical breakthrough from the L3 vertebral body on the right side leads to epidural tumor and tumor in the right neural foramen at L2-3. There is also involvement of the S1 and S2 neural foramina by tumor and sacral epidural tumor as noted above. 3. Spondylosis and degenerative disc disease also cause moderate impingement at L3-4 and mild impingement at T11-12 and L4-5, as noted above. These results were called by telephone at the time of interpretation on 07/13/2014 at 10:00 am to Dr. Reather Converse, who verbally acknowledged these results.   Electronically Signed   By: Sherryl Barters M.D.   On: 07/13/2014 10:07   Ct Abdomen Pelvis W Contrast  07/13/2014   CLINICAL DATA:  Bone metastases. Unknown primary. Past history of ocular melanoma.  EXAM: CT CHEST, ABDOMEN, AND PELVIS WITH CONTRAST  TECHNIQUE: Multidetector CT imaging of the chest, abdomen and pelvis was performed following the standard protocol during bolus administration of intravenous contrast.  CONTRAST:  147mL OMNIPAQUE IOHEXOL 300 MG/ML  SOLN  COMPARISON:  Chest CT on 11/10/2011  FINDINGS: CT CHEST FINDINGS  Bulky mediastinal lymphadenopathy is seen in the superior mediastinum, prevascular space, right paratracheal region, lateral aortic, and subcarinal regions. Largest index  area of lymphadenopathy in the right paratracheal region measures 4.9 x 5.9 cm on image 18, and there is compression of the left brachycephalic vein without thrombosis.  Mild lymphadenopathy is also seen in the right lower jugular chain and supraclavicular regions as well as the right axilla. There is also mild adenopathy in the right and left pericardial spaces. , consistent metastatic disease.  A 7 mm pulmonary nodule is seen in the right middle lobe on image 34 and a 4 mm nodule is seen in the anterior left upper lobe on image 23. These nodules were not seen on previous study and tiny pulmonary metastases cannot be excluded. A 4 mm pulmonary nodule in the left lower lobe on image 45 is stable since previous study in 2012 and likely benign.  No evidence of pleural effusion although small subpleural metastases measuring up to 1 cm are seen bilaterally on images 29 and 28. In addition, there are small metastases seen in the subcutaneous tissues of the chest wall bilaterally. Several lytic bone metastases are seen involving the thoracic spine and right lateral sixth rib.  CT ABDOMEN AND PELVIS FINDINGS  Benign appearing cyst again seen in the posterior right hepatic lobe as well as a few other tiny sub-cm cyst which appears stable. No definite liver metastases are seen. There are several small ill-defined low-attenuation lesions in the spleen, suspicious for splenic metastases. Kidneys and pancreas are unremarkable in appearance. No evidence of hydronephrosis.  Bilateral retrocrural lymphadenopathy demonstrated, consistent metastatic disease. There are numerous soft tissue nodules throughout the mesenteric and retroperitoneal fat throughout the abdomen pelvis, consistent with diffuse metastatic disease. Largest in the right posterior para renal space measures 4.6 x 6.0 cm on image 88.  There are also numerous metastatic soft tissue nodule seen throughout the abdominal and pelvic wall soft tissues bilaterally.   Numerous lytic bone metastases are seen involving the lumbar spine, pelvis, and hips.  No evidence of bowel obstruction. No evidence of inflammatory process, abscess, or ascites.  IMPRESSION: Bulky mediastinal lymphadenopathy with mild lymphadenopathy also seen in the right axilla, bilateral pericardial spaces, and retrocrural regions, consistent metastatic disease.  Diffuse body wall soft tissue metastases throughout the chest, abdomen, and pelvis.  Diffuse mesenteric and retroperitoneal metastases throughout the abdomen and pelvis.  Diffuse lytic bone metastases.  Probable small splenic metastases.   Electronically Signed   By: Earle Gell M.D.   On: 07/13/2014 15:20   Ct Biopsy  07/15/2014   CLINICAL DATA:  63 year old male with a remote (8 years ago) history of ocular melanoma which was thought to be in remission. However, he presents with back and knee pain and is going to have widespread bony and soft tissue metastatic disease. Additionally, he has a history of psoriatic arthritis and is on Enbrel. CT-guided biopsy is warranted to confirm tissue diagnosis.  EXAM: CT BIOPSY  Date: 07/15/2014  PROCEDURE: 1. CT-guided biopsy of right retroperitoneal soft tissue mass Interventional Radiologist:  Criselda Peaches, MD  ANESTHESIA/SEDATION: Moderate (conscious) sedation was used. 6 mg Versed, 200 mcg Fentanyl were administered intravenously. The patient's vital signs were monitored continuously by radiology nursing throughout the procedure.  Sedation Time: 22 minutes  TECHNIQUE: Informed consent was obtained from the patient following explanation of the procedure, risks, benefits and alternatives. The patient understands, agrees and consents for the procedure. All questions were addressed. A time out was performed.  A planning axial CT scan was performed with the patient in the prone position. A suitable skin entry site was selected and marked. The patient's right back and flank were then sterilely prepped and  draped in the usual fashion using Betadine skin prep.  However, due to extreme patient discomfort from his right knee, he was repositioned into the left lateral decubitus position and Re imaged. A new skin entry site was selected and marked. Local anesthesia was attained by infiltration with 1% lidocaine. A small dermatotomy was made. Under intermittent CT fluoroscopic guidance, a 17 gauge trocar needle was advanced into the margin of the mass. Multiple 18 gauge core biopsies were then coaxially obtained. Biopsy specimens were placed in saline to allow for flow cytometry if needed.  The biopsy device was removed. Additional CT fluoroscopic imaging demonstrates no evidence of acute complication. The patient tolerated the procedure itself well, although positioning was difficult due to his extremely painful metastatic disease.  IMPRESSION: Technically successful CT-guided core biopsy of right retroperitoneal soft tissue mass.  Signed,  Criselda Peaches, MD  [facility]  [facility]   Electronically Signed   By: Jacqulynn Cadet M.D.   On: 07/15/2014 11:43  IMPRESSION: Metastatic carcinoma to the spine with epidural spread of tumor and right sided pain.   PLAN:We discussed the likelihood of metastatic melanoma.  In the face of so much disease and most bones of the spine involved with tumor, I do not believe his is a candidate for surgery. We discussed the process of external beam radiation and the placement of tattoos. We discussed nausea and diarrhea as possible side effects from treatment. He sigend informed consent and agreed to proceed forward. We will get him simulated today and started later today.  I also believe his work up should included a cervical and brain mri scan to evaluate his cervical neck pain.    I spent 30 minutes  face to face with the patient and more than 50% of that time was spent in counseling and/or coordination of care.     ------------------------------------------------  Thea Silversmith, MD

## 2014-07-16 NOTE — Progress Notes (Signed)
Name: Nicholas Soto   MRN: [id number]  Date:  07/16/2014  DOB: 1951/10/14  Status:inpatient   DIAGNOSIS: Likely metastatic melanoma to spine.  CONSENT VERIFIED: yes   SET UP: Patient is setup supine   IMMOBILIZATION:  The following immobilization was used: vac loc  NARRATIVE:  [name] was brought to the CT Energy manager.  Identity was confirmed.  All relevant records and images related to the planned course of therapy were reviewed.  Then, the patient was positioned in a stable reproducible clinical set-up for radiation therapy.  CT images were obtained.  An isocenter was placed. Skin markings were placed.  The CT images were loaded into the planning software where the target and avoidance structures were contoured.  The radiation prescription was entered and confirmed. The patient was discharged in stable condition and tolerated simulation well.    TREATMENT PLANNING NOTE:  Treatment planning then occurred. I have requested : MLC's, isodose plan, basic dose calculation  I have requested 3 dimensional simulation with DVH of cord, kidneys, bowel and GTV.  A total of 3 complex treatment devices were formed in the form of MLCs to protect critical normal structures and the vac loc.

## 2014-07-16 NOTE — Progress Notes (Signed)
PT Cancellation Note  Patient Details Name: Nicholas Soto MRN: [id number] DOB: 1951/08/05   Cancelled Treatment:    Reason Eval/Treat Not Completed: Medical issues which prohibited therapy. Was gone to radiation earlier. Will return  7/22.   [name] 07/16/2014, 4:15 PM Tresa Endo PT [phone]

## 2014-07-16 NOTE — Progress Notes (Signed)
Patient ID: Nicholas Soto, male   DOB: Nov 21, 1951, 63 y.o.   MRN: [id number]  [facility] PROGRESS NOTE  Nicholas Soto FBP:[id number] DOB: Aug 16, 1951 DOA: 07/13/2014 PCP: [name], MD  Brief narrative: 63 y.o. 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 S/P staging evaluation with CT-guided biopsy in progress.   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 Patient still reports significant pain Continue Decadron 4 mg by mouth every 6 hours.  CT guided biopsy performed 07/15/2014, biopsy results pending CT scans of the chest, abdomen and pelvis done with findings as noted below. No obvious primary.  Pain control with Dilaudid-HP, OxyContin, oxycodone, Lyrica and Decadron Active Problems:  TOBACCO ABUSE  Nicotine patch ordered. Tobacco cessation counseling per nursing staff. Acute on chronic blood loss anemia  No signs of active bleeding, slight drop in hemoglobin overnight  Repeat CBC in the morning COPD  Continue Symbicort, Spiriva and albuterol when necessary.  Pt maintaining oxygen saturation at target range  Unspecified hereditary and idiopathic peripheral neuropathy  Burning pain in legs improved. Lyrica started. Loss of weight / severe protein calorie malnutrition  Cancer related cachexia. Dietitian consultation requested, recommendations appreciated Anxiety state, unspecified  Ativan ordered as needed. DVT prophylaxis  Lovenox ordered while inpatient   Consultants:  Interventional radiology for CT guided biopsy Procedures/Studies:  MRI lumbar spine 07/13/14:1. Unfortunately there are enhancing  tumors in most of the lumbar vertebra, the sacrum, paraspinal tissues, subcutaneous tissues, retroperitoneum, and perirenal spaces most compatible with a diffuse metastatic process. Oncology referral with staging workup to determine a suitable biopsy site recommended. 2. Cortical breakthrough from the L3 vertebral body on the right side leads to epidural tumor and tumor in the right neural foramen at L2-3. There is also involvement of the S1 and S2 neural foramina by tumor and sacral epidural tumor as noted above. 3. Spondylosis and degenerative disc disease also cause moderate impingement at L3-4 and mild impingement at T11-12 and L4-5, as noted above.  CT of the chest, abdomen and pelvis 07/13/14: Bulky mediastinal lymphadenopathy with mild lymphadenopathy also seen in the right axilla, bilateral pericardial spaces, and retrocrural regions, consistent metastatic disease. Diffuse body wall soft tissue metastases throughout the chest, abdomen, and pelvis. Diffuse mesenteric and retroperitoneal metastases throughout the abdomen and pelvis. Diffuse lytic bone metastases. Probable small splenic metastases.  Ct Biopsy 07/15/2014 CT-guided core biopsy of right retroperitoneal soft tissue mass.   Antibiotics:  None   Code Status: Full  Family Communication: Pt and wife at bedside  Disposition Plan: Home when medically stable   [name], MD  [facility] Pager [phone]  If 7PM-7AM, please contact night-coverage [email or web address] Password [facility] 07/16/2014, 9:54 AM   LOS: 3 days   HPI/Subjective: No acute overnight events. Persistent back pain  Objective: Filed Vitals:   07/15/14 1145 07/15/14 2014 07/15/14 2204 07/16/14 0506  BP: 109/58  112/57 129/69  Pulse: 70  70 77  Temp: 97.2 F (36.2 C)  97.5 F (36.4 C) 97.9 F (36.6 C)  TempSrc: Oral  Oral Axillary  Resp: 15  16 14   Height:      Weight:      SpO2:  99% 96% 98% 93%    Intake/Output Summary (Last 24 hours) at 07/16/14  0954 Last data filed at 07/16/14 0507  Gross per 24 hour  Intake 3498.34 ml  Output   1115 ml  Net 2383.34 ml    Exam:   General:  Pt is alert, follows commands appropriately, not in acute distress  Cardiovascular: Regular rate and rhythm, S1/S2, no murmurs  Respiratory: Clear to auscultation bilaterally, no wheezing, no crackles, no rhonchi  Abdomen: Soft, non tender, non distended, bowel sounds present  Extremities: No edema, pulses DP and PT palpable bilaterally  Neuro: Grossly nonfocal  Data Reviewed: Basic Metabolic Panel:  Recent Labs Lab 07/13/14 0342 07/16/14 0003  NA 137 138  K 3.9 4.4  CL 96 100  CO2 28 28  GLUCOSE 96 104*  BUN 15 22  CREATININE 0.77 0.83  CALCIUM 9.0 8.8   Liver Function Tests:  Recent Labs Lab 07/13/14 0342  AST 20  ALT 11  ALKPHOS 127*  BILITOT 0.4  PROT 6.8  ALBUMIN 3.4*   CBC:  Recent Labs Lab 07/13/14 0342 07/16/14 0003  WBC 9.1 8.0  HGB 12.7* 11.5*  HCT 37.5* 34.3*  MCV 89.1 88.4  PLT 326 289     Scheduled Meds: . antiseptic oral rinse  15 mL Mouth Rinse BID  . budesonide-formoterol  2 puff Inhalation QHS  . dexamethasone  4 mg Oral 4 times per day  . enoxaparin (LOVENOX) injection  40 mg Subcutaneous Q24H  . feeding supplement (ENSURE COMPLETE)  237 mL Oral TID BM  . fentaNYL  75 mcg Transdermal Q72H  . montelukast  10 mg Oral q morning - 10a  . nicotine  21 mg Transdermal Daily  . OxyCODONE  40 mg Oral Q12H  . pantoprazole  40 mg Oral q morning - 10a  . PARoxetine  10 mg Oral q morning - 10a  . pregabalin  75 mg Oral BID  . senna  1 tablet Oral BID  . tiotropium  18 mcg Inhalation Daily   Continuous Infusions:

## 2014-07-16 NOTE — Progress Notes (Signed)
[facility] Therapy Treatment Record Phone [phone]   Radiation Therapy was administered to Nicholas Soto on: 07/16/2014  5:56 PM and was treatment #1 out of a planned course of 10 treatments.

## 2014-07-17 ENCOUNTER — Ambulatory Visit
Admit: 2014-07-17 | Discharge: 2014-07-17 | Disposition: A | Discharge status: Home / Self Care | Payer: 59 | Attending: Radiation Oncology | Admitting: Radiation Oncology

## 2014-07-17 ENCOUNTER — Institutional Professional Consult (permissible substitution): Payer: 59 | Admitting: Radiation Oncology

## 2014-07-17 ENCOUNTER — Other Ambulatory Visit: Payer: Self-pay | Admitting: Internal Medicine

## 2014-07-17 ENCOUNTER — Inpatient Hospital Stay (HOSPITAL_COMMUNITY): Payer: 59

## 2014-07-17 ENCOUNTER — Encounter (HOSPITAL_COMMUNITY): Payer: Self-pay | Admitting: *Deleted

## 2014-07-17 ENCOUNTER — Ambulatory Visit: Admit: 2014-07-17 | Payer: 59 | Attending: Radiation Oncology | Admitting: Radiation Oncology

## 2014-07-17 DIAGNOSIS — R112 Nausea with vomiting, unspecified: Secondary | ICD-10-CM

## 2014-07-17 DIAGNOSIS — D61818 Other pancytopenia: Secondary | ICD-10-CM

## 2014-07-17 DIAGNOSIS — C7952 Secondary malignant neoplasm of bone marrow: Principal | ICD-10-CM

## 2014-07-17 DIAGNOSIS — G4700 Insomnia, unspecified: Secondary | ICD-10-CM

## 2014-07-17 DIAGNOSIS — R599 Enlarged lymph nodes, unspecified: Secondary | ICD-10-CM

## 2014-07-17 DIAGNOSIS — C7951 Secondary malignant neoplasm of bone: Principal | ICD-10-CM

## 2014-07-17 DIAGNOSIS — F17200 Nicotine dependence, unspecified, uncomplicated: Secondary | ICD-10-CM

## 2014-07-17 DIAGNOSIS — Z515 Encounter for palliative care: Secondary | ICD-10-CM

## 2014-07-17 DIAGNOSIS — Z85820 Personal history of malignant melanoma of skin: Secondary | ICD-10-CM

## 2014-07-17 DIAGNOSIS — M799 Soft tissue disorder, unspecified: Secondary | ICD-10-CM

## 2014-07-17 LAB — CBC
HEMATOCRIT: 35.7 % — AB (ref 39.0–52.0)
Hemoglobin: 12.1 g/dL — ABNORMAL LOW (ref 13.0–17.0)
MCH: 30 pg (ref 26.0–34.0)
MCHC: 33.9 g/dL (ref 30.0–36.0)
MCV: 88.4 fL (ref 78.0–100.0)
Platelets: 297 10*3/uL (ref 150–400)
RBC: 4.04 MIL/uL — ABNORMAL LOW (ref 4.22–5.81)
RDW: 13.6 % (ref 11.5–15.5)
WBC: 11.2 10*3/uL — ABNORMAL HIGH (ref 4.0–10.5)

## 2014-07-17 LAB — BASIC METABOLIC PANEL
Anion gap: 12 (ref 5–15)
BUN: 27 mg/dL — AB (ref 6–23)
CALCIUM: 8.9 mg/dL (ref 8.4–10.5)
CO2: 27 mEq/L (ref 19–32)
CREATININE: 0.73 mg/dL (ref 0.50–1.35)
Chloride: 98 mEq/L (ref 96–112)
GLUCOSE: 142 mg/dL — AB (ref 70–99)
Potassium: 4.2 mEq/L (ref 3.7–5.3)
Sodium: 137 mEq/L (ref 137–147)

## 2014-07-17 MED ORDER — MIRTAZAPINE 15 MG PO TABS
15.0000 mg | ORAL_TABLET | Freq: Every day | ORAL | Status: DC
  Administered 2014-07-17 – 2014-07-23 (×6): 15 mg via ORAL
  Filled 2014-07-17 (×8): qty 1

## 2014-07-17 MED ORDER — NALOXONE HCL 0.4 MG/ML IJ SOLN: 0.4000 mg | INTRAMUSCULAR | Status: DC | PRN

## 2014-07-17 MED ORDER — OXYCODONE HCL ER 40 MG PO T12A
80.0000 mg | EXTENDED_RELEASE_TABLET | Freq: Three times a day (TID) | ORAL | Status: DC
  Administered 2014-07-17 – 2014-07-18 (×2): 80 mg via ORAL
  Filled 2014-07-17 (×2): qty 2

## 2014-07-17 MED ORDER — HYDROMORPHONE 0.3 MG/ML IV SOLN
INTRAVENOUS | Status: DC
  Administered 2014-07-17: 6.2 mg via INTRAVENOUS
  Administered 2014-07-17 (×2): via INTRAVENOUS
  Administered 2014-07-18: 3 mg via INTRAVENOUS
  Administered 2014-07-18: 2.4 mg via INTRAVENOUS
  Administered 2014-07-18 (×3): via INTRAVENOUS
  Administered 2014-07-18: 2.4 mg via INTRAVENOUS
  Administered 2014-07-19: 0.3 mg via INTRAVENOUS
  Administered 2014-07-19: 4.5 mg via INTRAVENOUS
  Administered 2014-07-19: 2.09 mg via INTRAVENOUS
  Filled 2014-07-17 (×7): qty 25

## 2014-07-17 MED ORDER — ONDANSETRON HCL 4 MG/2ML IJ SOLN: 4.0000 mg | Freq: Four times a day (QID) | INTRAMUSCULAR | Status: DC | PRN

## 2014-07-17 MED ORDER — SODIUM CHLORIDE 0.9 % IJ SOLN: 9.0000 mL | INTRAMUSCULAR | Status: DC | PRN

## 2014-07-17 MED ORDER — LEVALBUTEROL HCL 0.63 MG/3ML IN NEBU: 0.6300 mg | INHALATION_SOLUTION | Freq: Four times a day (QID) | RESPIRATORY_TRACT | Status: DC | PRN

## 2014-07-17 MED ORDER — LORAZEPAM 1 MG PO TABS
1.0000 mg | ORAL_TABLET | Freq: Two times a day (BID) | ORAL | Status: DC
  Administered 2014-07-17 – 2014-07-24 (×15): 1 mg via ORAL
  Filled 2014-07-17 (×16): qty 1

## 2014-07-17 MED ORDER — DIPHENHYDRAMINE HCL 50 MG/ML IJ SOLN: 12.5000 mg | Freq: Four times a day (QID) | INTRAMUSCULAR | Status: DC | PRN

## 2014-07-17 MED ORDER — DIPHENHYDRAMINE HCL 12.5 MG/5ML PO ELIX
12.5000 mg | ORAL_SOLUTION | Freq: Four times a day (QID) | ORAL | Status: DC | PRN
Start: 2014-07-17 — End: 2014-07-23

## 2014-07-17 NOTE — Consult Note (Signed)
Physical Medicine and Rehabilitation Consult  Reason for Consult: Inability to walk due to metastatic cancer Referring Physician: Dr. Charlies Silvers   HPI: Nicholas Soto is a 63 y.o. male with history of HTN,COPD, left eye cancer s/p XRT who was admitted on 07/181/5 with two month history of worsening back pain radiating to testicles, N/V x 4-5 weeks as well as difficulty walking. MRI spine done revealing  enhancing tumors in most of the lumbar vertebra, the sacrum, paraspinal tissues, subcutaneous tissues,  retroperitoneum, and perirenal spaces most compatible with a diffuse metastatic process. Cortical breakthrough from L3 vertebral body on right leading to epidural tumor and tumor neural foramen at L2/3, S1 and S2. CT abdomen/pelvis done revealing diffuse body wall soft tissue metastases throughout the chest, abdomen, pelvis, mesenteric, retroperitoneal ares as well as diffuse lymphadenopathy. Radiology consulted for biopsy which was done 07/20--report pending. Dr. Pablo Ledger recommended 10-25? XRT treatments to the spine.  Dr. [name] consulted for input and feels that  pathology consistent with carcinoma (pancytokeratin positive) and negative for melanoma, lymphoma and prostate (PSA, 2.25). Pathology consistent with poorly differentiated carcinoma. Therapy evaluation done and CIR recommended for follow up therapy.  Patient states that he can only walk a few feet with the therapist for pain became intense. He is requiring IV Dilaudid. His pain is in his lower back area as well as in the right lower extremity from the thigh to the knee. He describes numbness in the right thigh area.   Review of Systems  Eyes: Negative for blurred vision and double vision.  Respiratory: Positive for shortness of breath (DOE) and wheezing.   Cardiovascular: Negative for chest pain and palpitations.  Gastrointestinal: Negative for heartburn, nausea, abdominal pain and constipation.  Genitourinary: Negative for  dysuria and urgency.  Musculoskeletal: Positive for back pain and myalgias.  Neurological: Positive for dizziness (chronic due to orhtostatic changes), sensory change, focal weakness, weakness and headaches.  Psychiatric/Behavioral: The patient is nervous/anxious.      Past Medical History  Diagnosis Date  . Chest pain     previous CP thought r/t GI; mild CAD by cath 2002  . Cancer Left    of the eye  . Hypertension     pt denies h/o htn  . COPD (chronic obstructive pulmonary disease)   . Hyperlipidemia     taken off statin 1 mo ago by primary care bc he was "put on a new med that might interfere"  . Tobacco abuse   . 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 Gout   . Shortness of breath   . Syncope    Past Surgical History  Procedure Laterality Date  . Shoulder surgery    . Neck surgery    . Eye surgery      cancer  . Spine surgery    . Knee cartilage surgery    . Cholecystectomy     Family History  Problem Relation Age of Onset  . Heart attack Mother 76  . Heart attack Brother 9  . Diabetes Mother   . Multiple sclerosis Brother   . Diabetes Sister   . Alzheimer's disease Maternal Grandmother   . Diabetes Maternal Grandmother   . Seizures Father     Social History:  Married. Wife disabled due to cardiac issues but can provide supervision past discharge. Independent PTA. He reports that he has been smoking Cigarettes--1 PPD.  He has a 10 pack-year smoking history. He  has never used smokeless tobacco. He reports that he does not drink alcohol or use illicit drugs.   Allergies  Allergen Reactions  . Demerol [Meperidine] Nausea Only  . Meperidine Hcl Nausea And Vomiting    Medications Prior to Admission  Medication Dose Route Frequency Provider Last Rate Last Dose  . nitroGLYCERIN (NITROSTAT) SL tablet 0.3 mg  0.3 mg Sublingual Q5 Min x 3 PRN Mancel Bale, PA-C   0.3 mg at 03/08/12 1505   Medications Prior to Admission  Medication Sig Dispense  Refill  . Aclidinium Bromide 400 MCG/ACT AEPB Inhale 1 puff into the lungs 2 (two) times daily.      Marland Kitchen albuterol (PROVENTIL HFA;VENTOLIN HFA) 108 (90 BASE) MCG/ACT inhaler Inhale 2 puffs into the lungs every 6 (six) hours as needed for wheezing or shortness of breath.      . budesonide-formoterol (SYMBICORT) 160-4.5 MCG/ACT inhaler Inhale 2 puffs into the lungs at bedtime.      . diphenhydrAMINE (SOMINEX) 25 MG tablet Take 25 mg by mouth at bedtime as needed for sleep.       . fentaNYL (DURAGESIC - DOSED MCG/HR) 75 MCG/HR Place 75 mcg onto the skin every 3 (three) days.       Marland Kitchen ibuprofen (ADVIL,MOTRIN) 200 MG tablet Take 400 mg by mouth every 6 (six) hours as needed for moderate pain.      Marland Kitchen LORazepam (ATIVAN) 0.5 MG tablet Take 1 mg by mouth 2 (two) times daily as needed for anxiety.      . montelukast (SINGULAIR) 10 MG tablet Take 10 mg by mouth every morning.       Marland Kitchen omeprazole (PRILOSEC) 20 MG capsule Take 20 mg by mouth every morning.      Marland Kitchen oxyCODONE (OXYCONTIN) 40 MG 12 hr tablet Take 40 mg by mouth at bedtime.       . Oxycodone HCl (ROXICODONE) 10 MG TABS Take 1-2 tablets (10-20 mg total) by mouth every 4 (four) hours as needed for severe pain.  15 tablet  0  . pantoprazole (PROTONIX) 40 MG tablet Take 40 mg by mouth every morning.       Marland Kitchen PARoxetine (PAXIL) 10 MG tablet Take 10 mg by mouth every morning.      . predniSONE (DELTASONE) 5 MG tablet Take 7.5 mg by mouth daily with breakfast.      . promethazine (PHENERGAN) 25 MG tablet Take 25 mg by mouth every 8 (eight) hours as needed for nausea or vomiting.        Home: Home Living Family/patient expects to be discharged to:: Private residence Living Arrangements: Spouse/significant other Available Help at Discharge: Family Type of Home: House Home Access: Stairs to enter Technical brewer of Steps: 3-4 Home Equipment: None  Functional History: Prior Function Level of Independence: Independent Functional Status:   Mobility: Bed Mobility Overal bed mobility: Needs Assistance Bed Mobility: Supine to Sit;Sit to Supine Supine to sit: Min guard Sit to supine: Min guard General bed mobility comments: cues for safety, pt  is impulsive Transfers Overall transfer level: Needs assistance Transfers: Sit to/from Stand Sit to Stand: +2 safety/equipment;Mod assist General transfer comment: safety for use of RW. Ambulation/Gait Ambulation/Gait assistance: +2 safety/equipment;Mod assist Ambulation Distance (Feet): 100 Feet Assistive device: Rolling walker (2 wheeled) Gait Pattern/deviations: Step-through pattern;Step-to pattern;Antalgic General Gait Details: cues for safety , increased support due to increased pain of R leg    ADL:    Cognition: Cognition Overall Cognitive Status: Within Functional Limits for tasks  assessed Orientation Level: Oriented X4 Cognition Arousal/Alertness: Awake/alert Behavior During Therapy: WFL for tasks assessed/performed Overall Cognitive Status: Within Functional Limits for tasks assessed  Blood pressure 116/60, pulse 69, temperature 97.6 F (36.4 C), temperature source Oral, resp. rate 11, height _0  (1.753 m), weight 74.662 kg (164 lb 9.6 oz), SpO2 93.00%. Physical Exam  Nursing note and vitals reviewed. Constitutional: He is oriented to person, place, and time. He appears well-developed and well-nourished.  In mild discomfort due to back pain (overdid it yesterday)  HENT:  Head: Normocephalic and atraumatic.  Eyes: Conjunctivae are normal. Pupils are equal, round, and reactive to light.  Neck: Normal range of motion. Neck supple.  Cardiovascular: Normal rate and regular rhythm.   Respiratory: Effort normal. No respiratory distress. He has wheezes (Auditory in upper airway).  GI: Soft. Bowel sounds are normal. He exhibits no distension. There is no tenderness.  Musculoskeletal:  Pain with palpation right lower spine/paraspinal musculature  Neurological: He  is alert and oriented to person, place, and time.  Speech clear. Follows commands without difficulty. Sensory deficits right lateral chest wall to RLE.   Skin: Skin is warm and dry.   motor strength is 5/5 bilateral deltoid, bicep, tricep, grip 3 minus in the right hip flexor 3 in the right knee extensor for in the right ankle dorsiflexor plantar flexor 5 in the left hip flexor knee extensor ankle dorsiflexor and plantar flexor Sensation is reduced to light touch in the right L3 dermatomal distribution  Results for orders placed during the hospital encounter of 07/13/14 (from the past 24 hour(s))  KAPPA/LAMBDA LIGHT CHAINS     Status: None   Collection Time    07/17/14  2:18 PM      Result Value Ref Range   Kappa free light chain 1.15  0.33 - 1.94 mg/dL   Lamda free light chains 1.23  0.57 - 2.63 mg/dL   Kappa, lamda light chain ratio 0.93  0.26 - 1.65   Dg Shoulder Left Port  07/17/2014   CLINICAL DATA:  Bilateral shoulder pain. History of multiple myeloma.  EXAM: PORTABLE LEFT SHOULDER - 2+ VIEW  COMPARISON:  Chest radiograph 04/23/2014  FINDINGS: Calcifications in the left lower neck are consistent with carotid artery calcifications. The left shoulder is located without a fracture. No suspicious lucent lesions in the bones. The left acromial-humeral interval appears narrowed on these images but could be related to projection.  IMPRESSION: No acute bone abnormality to the left shoulder.  Difficult to exclude underlying rotator cuff disease.  Evidence for carotid artery calcifications.   Electronically Signed   By: Markus Daft M.D.   On: 07/17/2014 10:33   Dg Shoulder Right Port  07/17/2014   CLINICAL DATA:  Bilateral shoulder pain.  EXAM: PORTABLE RIGHT SHOULDER - 2+ VIEW  COMPARISON:  Chest radiograph 04/23/2014 and chest CT 07/13/2014  FINDINGS: Right shoulder is located without a fracture. Visualized right ribs are intact. No suspicious lucent bone lesions. Right AC joint is intact. Soft  tissue fullness along the right side of the mediastinum is compatible with known lymphadenopathy.  IMPRESSION: No acute bone abnormality to the right shoulder.  Mediastinal fullness is consistent with known mediastinal lymphadenopathy.   Electronically Signed   By: Markus Daft M.D.   On: 07/17/2014 10:36    Assessment/Plan: Diagnosis: Metastatic tumor involving multiple areas in the thoracic and lumbar spine as well as epidural space causing right L3 radiculopathy 1. Does the need for close, 24 hr/day medical supervision  in concert with the patient's rehab needs make it unreasonable for this patient to be served in a less intensive setting? Potentially 2. Co-Morbidities requiring supervision/potential complications: COPD, renal mass, severe protein calorie malnutrition, psoriatic arthritis 3. Due to bladder management, bowel management, safety, skin/wound care, disease management, medication administration, pain management and patient education, does the patient require 24 hr/day rehab nursing? Potentially 4. Does the patient require coordinated care of a physician, rehab nurse, PT, OT to address physical and functional deficits in the context of the above medical diagnosis(es)? Potentially Addressing deficits in the following areas: balance, endurance, locomotion, strength, transferring, bowel/bladder control, bathing, dressing, feeding, grooming and toileting 5. Can the patient actively participate in an intensive therapy program of at least 3 hrs of therapy per day at least 5 days per week? No 6. The potential for patient to make measurable gains while on inpatient rehab is poor 7. Anticipated functional outcomes upon discharge from inpatient rehab are n/a  with PT, n/a with OT, n/a with SLP. 8. Estimated rehab length of stay to reach the above functional goals is: NA 9. Does the patient have adequate social supports to accommodate these discharge functional goals? Potentially 10. Anticipated D/C  setting: Home 11. Anticipated post D/C treatments: [facility] therapy 12. Overall Rehab/Functional Prognosis: fair  RECOMMENDATIONS: This patient's condition is appropriate for continued rehabilitative care in the following setting: SNF Patient has agreed to participate in recommended program. Potentially Note that insurance prior authorization may be required for reimbursement for recommended care.  Comment: Patient unable to tolerate rehabilitation for pain standpoint. Also would recommend neurosurgical consultation to evaluate need for spinal orthosis given the L3 cortical lesion    07/18/2014

## 2014-07-17 NOTE — Consult Note (Signed)
[place]  Telephone:(336) [place] NOTE  DRYSTAN READER                                MR#: [id number]  DOB: May 15, 1951                                        CSN#: [id number]  Referring MD: Dr. Sheliah [facility], Dr. Charlies Silvers  Primary MD:  [name], MD    Reason for Consult: Metastatic Bone Lesions   GBT:DVVOHYW L Pry is a 63 y.o. 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 Despite no clinical findings by his PCP, pain progressed, for which he was referred to [facility] on 07/08/14, but patient's pain was debilitating, and he was unable to see physician at the time. Plain films of his lumbar spine had mild multilevel DDD and moderate facet arthropathy, but no evidence of acute abnormality. Prior MRI of his spine a year or two ago that was reportedly normal.  On the day of admission, patient had severe pain which limited ambulation, for which he was brought to [facility] by EMS. At the time of admission, he had had 3-4 month history of urinary hesitancy, and a 30 lb weight loss with decreased appetite. He had increasing fatigue, intermittent nausea and vomiting, foot paresthesias, and shortness of breath, acute on chronic. No hemoptysis, epistaxis or hematuria. He does have intermittent blood in the stools. He reports a history of polyps, with last colonoscopy 2 years ago, benign.   MRI  Of the spine on 7/20 revealed  tumors in most of the lumbar vertebra, the sacrum, paraspinal tissues, subcutaneous tissues, retroperitoneum, and perirenal spaces most compatible with a diffuse metastatic process. Cortical breakthrough from the L3 vertebral body on the right side leads to epidural tumor and tumor in the right neural foramen at L2-3. There is also  involvement of the S1 and S2 neural foramina by tumor and sacral epidural tumor  No indication of cord compression.  CT of the abdomen and pelvis on 7/20 showed bulky mediastinal lymphadenopathy with mild lymphadenopathy also seen in the right axilla, bilateral pericardial spaces, and retrocrural regions, consistent metastatic disease.  Diffuse body wall soft tissue metastases throughout the chest, abdomen, and pelvis.  Diffuse mesenteric and retroperitoneal metastases throughout the abdomen and pelvis.  Diffuse lytic bone metastases.  Probable small splenic metastases.  He was placed on Decadron 4 mg q 6 hrs and a CT guided biopsy of the retroperitoneal soft tissue mass performed 07/15/2014, biopsy results pending. Radiation Oncology began radiation to the region with some improvement of the pain level. UPEP, SPEP, Kappa/Lambda light chains for further evaluation are pending. Calcium and  renal function  Hemoglobin only mildly decreased at 12.1.ANC is 1,200 and Plts 159,000 We were kindly asked to see the patient in consultation with recommendations    .Based on these findings were were asked to see the patient in consultation for treatment options.   PMH:  Past Medical History  Diagnosis Date  . Chest pain     previous CP thought r/t GI; mild CAD by cath 2002  .  Cancer Left    of the eye  . Hypertension     pt denies h/o htn  . COPD (chronic obstructive pulmonary disease)   . Hyperlipidemia     taken off statin 1 mo ago by primary care bc he was "put on a new med that might interfere"  . Tobacco abuse   . 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 Gout   . Shortness of breath   . Syncope     Surgeries:  Past Surgical History  Procedure Laterality Date  . Shoulder surgery    . Neck surgery    . Eye surgery      cancer  . Spine surgery    . Knee cartilage surgery    . Cholecystectomy      Allergies:  Allergies  Allergen Reactions  . Demerol [Meperidine]  Nausea Only  . Meperidine Hcl Nausea And Vomiting    Medications:   Prior to Admission:  Facility-administered medications prior to admission  Medication Dose Route Frequency Provider Last Rate Last Dose  . nitroGLYCERIN (NITROSTAT) SL tablet 0.3 mg  0.3 mg Sublingual Q5 Min x 3 PRN Mancel Bale, PA-C   0.3 mg at 03/08/12 1505   Prescriptions prior to admission  Medication Sig Dispense Refill  . Aclidinium Bromide 400 MCG/ACT AEPB Inhale 1 puff into the lungs 2 (two) times daily.      Marland Kitchen albuterol (PROVENTIL HFA;VENTOLIN HFA) 108 (90 BASE) MCG/ACT inhaler Inhale 2 puffs into the lungs every 6 (six) hours as needed for wheezing or shortness of breath.      . budesonide-formoterol (SYMBICORT) 160-4.5 MCG/ACT inhaler Inhale 2 puffs into the lungs at bedtime.      . diphenhydrAMINE (SOMINEX) 25 MG tablet Take 25 mg by mouth at bedtime as needed for sleep.       . fentaNYL (DURAGESIC - DOSED MCG/HR) 75 MCG/HR Place 75 mcg onto the skin every 3 (three) days.       Marland Kitchen ibuprofen (ADVIL,MOTRIN) 200 MG tablet Take 400 mg by mouth every 6 (six) hours as needed for moderate pain.      Marland Kitchen LORazepam (ATIVAN) 0.5 MG tablet Take 1 mg by mouth 2 (two) times daily as needed for anxiety.      . montelukast (SINGULAIR) 10 MG tablet Take 10 mg by mouth every morning.       Marland Kitchen omeprazole (PRILOSEC) 20 MG capsule Take 20 mg by mouth every morning.      Marland Kitchen oxyCODONE (OXYCONTIN) 40 MG 12 hr tablet Take 40 mg by mouth at bedtime.       . Oxycodone HCl (ROXICODONE) 10 MG TABS Take 1-2 tablets (10-20 mg total) by mouth every 4 (four) hours as needed for severe pain.  15 tablet  0  . pantoprazole (PROTONIX) 40 MG tablet Take 40 mg by mouth every morning.       Marland Kitchen PARoxetine (PAXIL) 10 MG tablet Take 10 mg by mouth every morning.      . predniSONE (DELTASONE) 5 MG tablet Take 7.5 mg by mouth daily with breakfast.      . promethazine (PHENERGAN) 25 MG tablet Take 25 mg by mouth every 8 (eight) hours as needed for nausea or  vomiting.        QBV:[id number], acetaminophen, alum & mag hydroxide-simeth, diphenhydrAMINE, diphenhydrAMINE, HYDROmorphone (DILAUDID) injection, ketorolac, levalbuterol, naloxone, nitroGLYCERIN, ondansetron (ZOFRAN) IV, ondansetron (ZOFRAN) IV, ondansetron, polyethylene glycol, sodium chloride, zolpidem  ROS: See HPI  for significant positives Constitutional: Denies fevers, chills or abnormal night sweats Eyes: Denies blurriness of vision, double vision or watery eyes Ears, nose, mouth, throat, and face: Denies mucositis or sore throat Respiratory: remarkable for chronic shortness of breath.  dyspnea or wheezes Cardiovascular: Denies palpitation, chest discomfort or lower extremity swelling Gastrointestinal:  Positive for  Nausea and vomiting, heartburn or change in bowel habits GU: POsitive for urgency, hesitancy and weak urinary stream. No hematuria.  Skin: Denies abnormal skin rashes except for a history of psoriasis. Patient "avoids the sun since his ocular tumor" Musculoskeletal: Positive for intractable low back pain as per HPI Lymphatics: Denies new lymphadenopathy or easy bruising Neurological: he has foot paresthesias.No new weaknesses. Behavioral/Psych: Mood is anxious, no new changes  All other systems were reviewed with the patient and are negative.   Family History:    Family History  Problem Relation Age of Onset  . Heart attack Mother 17  . Heart attack Brother 63  . Diabetes Mother   . Multiple sclerosis Brother   . Diabetes Sister   . Alzheimer's disease Maternal Grandmother   . Diabetes Maternal Grandmother   . Seizures Father     No family history of hematological  disorders.  Social History:  reports that he has been smoking Cigarettes.  He reports smoking since "the womb". He smokes about 1ppd.  He reports that he does not drink alcohol or use illicit drugs.Marland Kitchen He lives in [place], married. He worked in a Tobacco plant until retirement.    Physical  Exam    ECOG PERFORMANCE STATUS:  3  Symptomatic, >50% in bed, but not bedbound (Capable of only limited self-care, confined to bed or chair 50% or more of waking hours)    Filed Vitals:   07/17/14 0510  BP: 149/75  Pulse: 78  Temp: 97.6 F (36.4 C)  Resp: 16   Filed Weights   07/13/14 1215  Weight: 164 lb 9.6 oz (74.662 kg)    GENERAL:alert, very uncomfortable due to his lower back pain.  SKIN: skin color, texture, turgor are normal, no rashes or significant lesions EYES: normal, conjunctiva are pink and non-injected, sclera clear OROPHARYNX:no exudate, no erythema and lips, buccal mucosa, and tongue normal  NECK: supple, thyroid normal size, non-tender, without nodularity LYMPH:  no palpable lymphadenopathy in the cervical, axillary or inguinal area LUNGS: clear to auscultation and percussion with normal breathing effort HEART: regular rate & rhythm and no murmurs and no lower extremity edema ABDOMEN:abdomen soft, non-tender and normal bowel sounds Musculoskeletal:no cyanosis of digits and no clubbing , Cannot reproduce lumbar pain.  PSYCH: alert & oriented x 3 with fluent speech NEURO: no focal motor/sensory deficits    Labs:  CBC   Recent Labs Lab 07/13/14 0342 07/16/14 0003 07/17/14 0458  WBC 9.1 8.0 11.2*  HGB 12.7* 11.5* 12.1*  HCT 37.5* 34.3* 35.7*  PLT 326 289 297  MCV 89.1 88.4 88.4  MCH 30.2 29.6 30.0  MCHC 33.9 33.5 33.9  RDW 13.7 13.5 13.6     CMP    Recent Labs Lab 07/13/14 0342 07/16/14 0003 07/17/14 0458  NA 137 138 137  K 3.9 4.4 4.2  CL 96 100 98  CO2 _0 GLUCOSE 96 104* 142*  BUN 15 22 27*  CREATININE 0.77 0.83 0.73  CALCIUM 9.0 8.8 8.9  AST 20  --   --   ALT 11  --   --   ALKPHOS 127*  --   --  BILITOT 0.4  --   --       Imaging Studies:  Dg Lumbar Spine Complete  07/08/2014   COMPARISON:  05/30/2014 radiographs.  04/10/2008 MR  FINDINGS: Normal alignment noted.  Mild multilevel degenerative disc disease again  identified.  Mild to moderate facet arthropathy in the mid and lower lumbar spine is unchanged.  There is no evidence of acute fracture or subluxation.  No focal bony lesions are noted.  Postsurgical changes in the posterior lower lumbar spine identified.  IMPRESSION: No evidence of acute abnormality.  Mild multilevel degenerative disc disease and mild to moderate facet arthropathy.   Electronically Signed   By: Hassan Rowan M.D.   On: 07/08/2014 11:05   Ct Chest W Contrast  07/13/2014   COMPARISON:  Chest CT on 11/10/2011  FINDINGS: CT CHEST FINDINGS  Bulky mediastinal lymphadenopathy is seen in the superior mediastinum, prevascular space, right paratracheal region, lateral aortic, and subcarinal regions. Largest index area of lymphadenopathy in the right paratracheal region measures 4.9 x 5.9 cm on image 18, and there is compression of the left brachycephalic vein without thrombosis.  Mild lymphadenopathy is also seen in the right lower jugular chain and supraclavicular regions as well as the right axilla. There is also mild adenopathy in the right and left pericardial spaces. , consistent metastatic disease.  A 7 mm pulmonary nodule is seen in the right middle lobe on image 34 and a 4 mm nodule is seen in the anterior left upper lobe on image 23. These nodules were not seen on previous study and tiny pulmonary metastases cannot be excluded. A 4 mm pulmonary nodule in the left lower lobe on image 45 is stable since previous study in 2012 and likely benign.  No evidence of pleural effusion although small subpleural metastases measuring up to 1 cm are seen bilaterally on images 29 and 28. In addition, there are small metastases seen in the subcutaneous tissues of the chest wall bilaterally. Several lytic bone metastases are seen involving the thoracic spine and right lateral sixth rib.  CT ABDOMEN AND PELVIS FINDINGS  Benign appearing cyst again seen in the posterior right hepatic lobe as well as a few other tiny  sub-cm cyst which appears stable. No definite liver metastases are seen. There are several small ill-defined low-attenuation lesions in the spleen, suspicious for splenic metastases. Kidneys and pancreas are unremarkable in appearance. No evidence of hydronephrosis.  Bilateral retrocrural lymphadenopathy demonstrated, consistent metastatic disease. There are numerous soft tissue nodules throughout the mesenteric and retroperitoneal fat throughout the abdomen pelvis, consistent with diffuse metastatic disease. Largest in the right posterior para renal space measures 4.6 x 6.0 cm on image 88.  There are also numerous metastatic soft tissue nodule seen throughout the abdominal and pelvic wall soft tissues bilaterally.  Numerous lytic bone metastases are seen involving the lumbar spine, pelvis, and hips.  No evidence of bowel obstruction. No evidence of inflammatory process, abscess, or ascites.  IMPRESSION: Bulky mediastinal lymphadenopathy with mild lymphadenopathy also seen in the right axilla, bilateral pericardial spaces, and retrocrural regions, consistent metastatic disease.  Diffuse body wall soft tissue metastases throughout the chest, abdomen, and pelvis.  Diffuse mesenteric and retroperitoneal metastases throughout the abdomen and pelvis.  Diffuse lytic bone metastases.  Probable small splenic metastases.   Electronically Signed   By: Earle Gell M.D.   On: 07/13/2014 15:20   Mr Lumbar Spine W Wo Contrast  07/13/2014   COMPARISON:  07/08/2014  FINDINGS: Unfortunately there are  numerous masses scattered in the lumbar spine, sacrum, retroperitoneum, perirenal spaces, paraspinal musculature, and even the subcutaneous tissues highly concerning for a diffusely metastatic process. Index lesions include a 3.9 cm mass along the right mid kidney medially which may or may not be arising from the kidney and a right paraspinal mass at the L3 level which measures 4.2 cm in AP dimension along the deep margin of the  psoas muscle.  The bony metastatic lesions are observed involving T11, T12, L2, L3, and L4, as well as the S1 and S2 levels of the sacrum. The L3 level demonstrates cortical breakthrough along the posterior vertebral margin eccentric to the right and extending into the right pedicle, with right epidural tumor and tumor extending in the inferior aspect of the right neural foramen. The right S1 tumor partial extends into the right S1 neural foramen and epidural space, surrounding the S1 nerve roots. The S2 tumor extends into the left S2 neural foramen and epidural space, adjacent to the left S2 nerve roots.  There is a large cystic lesion in the right hepatic lobe posteriorly.  Despite efforts by the technologist and patient, motion artifact is present on today's exam and could not be eliminated. This reduces exam sensitivity and specificity. Additional findings at individual levels are as follows:  T11-12: Mild right eccentric central narrowing of the thecal sac due to a right paracentral disc protrusion.  T12-L1:  Unremarkable.  L1-2: No impingement. Tumor observed in the left L2 posterior elements.  L2-3: Tumor extends inferiorly in the right neural foramen and surrounds the right L2 nerve roots and spinal nerve. Right eccentric epidural tumor arising from L3. Tumor in the right lateral recess.  L3-4: Moderate right foraminal stenosis due to spurring, disc bulge, and facet arthropathy. Mild central narrowing of the thecal sac.  L4-5: Mild bilateral subarticular lateral recess stenosis due to facet arthropathy. Prior laminectomies. Borderline left foraminal stenosis due to facet and intervertebral spurring.  L5-S1:  No impingement.  Prior laminectomies at L5.  IMPRESSION: 1. Unfortunately there are enhancing tumors in most of the lumbar vertebra, the sacrum, paraspinal tissues, subcutaneous tissues, retroperitoneum, and perirenal spaces most compatible with a diffuse metastatic process. Oncology referral with  staging workup to determine a suitable biopsy site recommended. 2. Cortical breakthrough from the L3 vertebral body on the right side leads to epidural tumor and tumor in the right neural foramen at L2-3. There is also involvement of the S1 and S2 neural foramina by tumor and sacral epidural tumor as noted above. 3. Spondylosis and degenerative disc disease also cause moderate impingement at L3-4 and mild impingement at T11-12 and L4-5, as noted above. These results were called by telephone at the time of interpretation on 07/13/2014 at 10:00 am to Dr. Reather Converse, who verbally acknowledged these results.   Electronically Signed   By: Sherryl Barters M.D.   On: 07/13/2014 10:07   Ct Abdomen Pelvis W Contrast  07/13/2014   OMPARISON:  Chest CT on 11/10/2011  FINDINGS: CT CHEST FINDINGS  Bulky mediastinal lymphadenopathy is seen in the superior mediastinum, prevascular space, right paratracheal region, lateral aortic, and subcarinal regions. Largest index area of lymphadenopathy in the right paratracheal region measures 4.9 x 5.9 cm on image 18, and there is compression of the left brachycephalic vein without thrombosis.  Mild lymphadenopathy is also seen in the right lower jugular chain and supraclavicular regions as well as the right axilla. There is also mild adenopathy in the right and left pericardial spaces. ,  consistent metastatic disease.  A 7 mm pulmonary nodule is seen in the right middle lobe on image 34 and a 4 mm nodule is seen in the anterior left upper lobe on image 23. These nodules were not seen on previous study and tiny pulmonary metastases cannot be excluded. A 4 mm pulmonary nodule in the left lower lobe on image 45 is stable since previous study in 2012 and likely benign.  No evidence of pleural effusion although small subpleural metastases measuring up to 1 cm are seen bilaterally on images 29 and 28. In addition, there are small metastases seen in the subcutaneous tissues of the chest wall  bilaterally. Several lytic bone metastases are seen involving the thoracic spine and right lateral sixth rib.  CT ABDOMEN AND PELVIS FINDINGS  Benign appearing cyst again seen in the posterior right hepatic lobe as well as a few other tiny sub-cm cyst which appears stable. No definite liver metastases are seen. There are several small ill-defined low-attenuation lesions in the spleen, suspicious for splenic metastases. Kidneys and pancreas are unremarkable in appearance. No evidence of hydronephrosis.  Bilateral retrocrural lymphadenopathy demonstrated, consistent metastatic disease. There are numerous soft tissue nodules throughout the mesenteric and retroperitoneal fat throughout the abdomen pelvis, consistent with diffuse metastatic disease. Largest in the right posterior para renal space measures 4.6 x 6.0 cm on image 88.  There are also numerous metastatic soft tissue nodule seen throughout the abdominal and pelvic wall soft tissues bilaterally.  Numerous lytic bone metastases are seen involving the lumbar spine, pelvis, and hips.  No evidence of bowel obstruction. No evidence of inflammatory process, abscess, or ascites.  IMPRESSION: Bulky mediastinal lymphadenopathy with mild lymphadenopathy also seen in the right axilla, bilateral pericardial spaces, and retrocrural regions, consistent metastatic disease.  Diffuse body wall soft tissue metastases throughout the chest, abdomen, and pelvis.  Diffuse mesenteric and retroperitoneal metastases throughout the abdomen and pelvis.  Diffuse lytic bone metastases.  Probable small splenic metastases.   Electronically Signed   By: Earle Gell M.D.   On: 07/13/2014 15:20   Ct Biopsy  07/15/2014    CT BIOPSY  Date: 07/15/2014  PROCEDURE: 1. CT-guided biopsy of right retroperitoneal soft tissue mass Interventional Radiologist:  Criselda Peaches, MD  ANESTHESIA/SEDATION: Moderate (conscious) sedation was used. 6 mg Versed, 200 mcg Fentanyl were administered  intravenously. The patient's vital signs were monitored continuously by radiology nursing throughout the procedure.  Sedation Time: 22 minutes  TECHNIQUE: Informed consent was obtained from the patient following explanation of the procedure, risks, benefits and alternatives. The patient understands, agrees and consents for the procedure. All questions were addressed. A time out was performed.  A planning axial CT scan was performed with the patient in the prone position. A suitable skin entry site was selected and marked. The patient's right back and flank were then sterilely prepped and draped in the usual fashion using Betadine skin prep.  However, due to extreme patient discomfort from his right knee, he was repositioned into the left lateral decubitus position and Re imaged. A new skin entry site was selected and marked. Local anesthesia was attained by infiltration with 1% lidocaine. A small dermatotomy was made. Under intermittent CT fluoroscopic guidance, a 17 gauge trocar needle was advanced into the margin of the mass. Multiple 18 gauge core biopsies were then coaxially obtained. Biopsy specimens were placed in saline to allow for flow cytometry if needed.  The biopsy device was removed. Additional CT fluoroscopic imaging demonstrates no evidence  of acute complication. The patient tolerated the procedure itself well, although positioning was difficult due to his extremely painful metastatic disease.  IMPRESSION: Technically successful CT-guided core biopsy of right retroperitoneal soft tissue mass.  Signed,  Criselda Peaches, MD  [facility]  [facility]   Electronically Signed   By: Jacqulynn Cadet M.D.   On: 07/15/2014 11:43   Dg Shoulder Left Port  07/17/2014   CLINICAL DATA:  Bilateral shoulder pain. History of multiple myeloma.  EXAM: PORTABLE LEFT SHOULDER - 2+ VIEW  COMPARISON:  Chest radiograph 04/23/2014  FINDINGS: Calcifications in the left  lower neck are consistent with carotid artery calcifications. The left shoulder is located without a fracture. No suspicious lucent lesions in the bones. The left acromial-humeral interval appears narrowed on these images but could be related to projection.  IMPRESSION: No acute bone abnormality to the left shoulder.  Difficult to exclude underlying rotator cuff disease.  Evidence for carotid artery calcifications.   Electronically Signed   By: Markus Daft M.D.   On: 07/17/2014 10:33   Dg Shoulder Right Port  07/17/2014   CLINICAL DATA:  Bilateral shoulder pain.  EXAM: PORTABLE RIGHT SHOULDER - 2+ VIEW  COMPARISON:  Chest radiograph 04/23/2014 and chest CT 07/13/2014  FINDINGS: Right shoulder is located without a fracture. Visualized right ribs are intact. No suspicious lucent bone lesions. Right AC joint is intact. Soft tissue fullness along the right side of the mediastinum is compatible with known lymphadenopathy.  IMPRESSION: No acute bone abnormality to the right shoulder.  Mediastinal fullness is consistent with known mediastinal lymphadenopathy.   Electronically Signed   By: Markus Daft M.D.   On: 07/17/2014 10:36      A/P: 63 y.o. male with  1. Metastatic Bone Lesions Patient s/p US guided biopsy of the retroperitoneal soft tissue mass performed 07/15/2014, biopsy results pending. Radiation Oncology began radiation to the region with some improvement of the pain level.  UPEP, SPEP, Kappa/Lambda light chains for further evaluation are pending Once Pathology report is available, further recommendations are to proceed regarding treatment options  2. 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 No vision changes. One may consider mRI of the brain if brain mets are suspected, pending of formal diagnosis to complete staging.   3. Pancytopenia Multifactorial, in the setting of polypharmacy, dilution and acute on chronic disease.  Rule out Multiple myeloma. Biopsy results pending Monitor carefully. No  transfusions indicated  4. Full Code Palliative Care evaluation ongoing to help with symptoms.   5. Tobacco Cessation recommended  6. Nutrition evaluation Recommended     **Disclaimer: This note was dictated with voice recognition software. Similar sounding words can inadvertently be transcribed and this note may contain transcription errors which may not have been corrected upon publication of note.** Sharene Butters E, PA-C 07/17/2014 2:28 PM   ADDENDUM:  Patient seen and examined. I agree with the above assessment and plan.  Patient is a 63 year old with history of L. Melanoma of the eye (2007), long-standing tobacco abuse presents with profound weight lost and severe low back pain and scans including CT C/A/P and MRI of spine consistent with diffuse metastatic disease.  Preliminary pathology discussed with Dr. Donato Heinz of pathology consistent with carcinoma (pancytokeratin positive) and negative for melanoma, lymphoma and prostate (PSA, 2.25).  We will await final pathology as reported tomorrow.  I agree with palliative XRT per Dr. Pablo Ledger.  We will follow Mr. Valverde with you.   I  personally saw this patient and performed a substantive portion of this encounter with the listed APP documented above.   Kimbely Whiteaker, MD

## 2014-07-17 NOTE — Progress Notes (Signed)
Thank you for consult on Nicholas Soto. Note that workup ongoing and therapy evaluations pending. Will await formal evaluations to help determine best rehab venue past discharge.

## 2014-07-17 NOTE — Evaluation (Signed)
Physical Therapy Evaluation Patient Details Name: Nicholas Soto MRN: [id number] DOB: 09-10-1951 Today's Date: 07/17/2014   History of Present Illness  pt admitted 07/13/14 patient had severe pain which limited ambulation, for which he was brought to [facility] by EMS.  MRI of the lumbar spine on 07/13/14 showed widespread bony metastatic disease includin L3 vertebral body on the right side leading to epidural tumor and tumor in the right neural foramen at L2-3. There was also involvement of the S1 and S2 neural foramina by tumor and sacral epidural tumor (S2 was on the left side). CT abdomen/ pelvis showed widespread disease in multiple lymph node regions and diffuse lytic bone mets.  Clinical Impression  Pt very motivated to walk but had severe pain  In R leg. Pt will benefit from PT to address problems listed in noted.     Follow Up Recommendations CIR;Supervision/Assistance - 24 hour    Equipment Recommendations  Rolling walker with 5" wheels    Recommendations for Other Services Rehab consult;OT consult     Precautions / Restrictions Precautions Precautions: Fall Precaution Comments: R knee will buckle, increased pain with mobility      Mobility  Bed Mobility Overal bed mobility: Needs Assistance Bed Mobility: Supine to Sit;Sit to Supine     Supine to sit: Min guard Sit to supine: Min guard   General bed mobility comments: cues for safety, pt  is impulsive  Transfers Overall transfer level: Needs assistance   Transfers: Sit to/from Stand Sit to Stand: +2 safety/equipment;Mod assist         General transfer comment: safety for use of RW.  Ambulation/Gait Ambulation/Gait assistance: +2 safety/equipment;Mod assist Ambulation Distance (Feet): 100 Feet Assistive device: Rolling walker (2 wheeled) Gait Pattern/deviations: Step-through pattern;Step-to pattern;Antalgic     General Gait Details: cues for safety , increased support due to increased pain of R  leg  Stairs            Wheelchair Mobility    Modified Rankin (Stroke Patients Only)       Balance                                             Pertinent Vitals/Pain Had medication prior and PCA, R leg pain10.    Home Living Family/patient expects to be discharged to:: Private residence Living Arrangements: Spouse/significant other Available Help at Discharge: Family Type of Home: House Home Access: Stairs to enter   Technical brewer of Steps: 3-4   Home Equipment: None      Prior Function Level of Independence: Independent               Hand Dominance        Extremity/Trunk Assessment   Upper Extremity Assessment: Generalized weakness;RUE deficits/detail RUE Deficits / Details: difficulty reaching up with R arm         Lower Extremity Assessment: LLE deficits/detail;RLE deficits/detail RLE Deficits / Details: dorsiflexion 3,  able to flex and extend knee and hip. as pt  walked  R leg pain increased and had more difficulty ambulating.       Communication   Communication: No difficulties  Cognition Arousal/Alertness: Awake/alert Behavior During Therapy: WFL for tasks assessed/performed Overall Cognitive Status: Within Functional Limits for tasks assessed                      General  Comments      Exercises        Assessment/Plan    PT Assessment Patient needs continued PT services  PT Diagnosis Difficulty walking;Abnormality of gait;Acute pain   PT Problem List Decreased strength;Decreased activity tolerance;Decreased mobility;Decreased knowledge of use of DME;Decreased safety awareness;Pain;Decreased knowledge of precautions;Impaired sensation  PT Treatment Interventions DME instruction;Gait training;Functional mobility training;Therapeutic exercise;Therapeutic activities   PT Goals (Current goals can be found in the Care Plan section) Acute Rehab PT Goals Patient Stated Goal: I want to walk PT Goal  Formulation: With patient    Frequency Min 3X/week   Barriers to discharge        Co-evaluation               End of Session   Activity Tolerance: Patient limited by pain Patient left: in bed;with call bell/phone within reach;with bed alarm set Nurse Communication: Mobility status         Time: 1739-1753 PT Time Calculation (min): 14 min   Charges:   PT Evaluation $Initial PT Evaluation Tier I: 1 Procedure PT Treatments $Gait Training: 8-22 mins   PT G Codes:          [name] 07/17/2014, 6:30 PM Tresa Endo PT [phone]

## 2014-07-17 NOTE — Progress Notes (Signed)
[facility] Therapy Treatment Record Phone [phone]   Radiation Therapy was administered to Nicholas Soto on: 07/17/2014  11:24 AM and was treatment # 2 out of a planned course of 10 treatments.

## 2014-07-17 NOTE — Progress Notes (Addendum)
Patient ID: Nicholas Soto, male   DOB: 1951/02/12, 63 y.o.   MRN: [id number] [facility] PROGRESS NOTE  Nicholas Soto JJO:[id number] DOB: [birth year] DOA: 07/13/2014 PCP: [name], MD  Brief narrative: 63 y.o. 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 Specifically, MRI of the lumbar spine on 07/13/14 showed widespread bony metastatic disease including L3 vertebral body on the right side leading to epidural tumor and tumor in the right neural foramen at L2-3. There was also involvement of the S1 and S2 neural foramina by tumor and sacral epidural tumor (S2 was on the left side). CT abdomen/ pelvis showed widespread disease in multiple lymph node regions and diffuse lytic bone mets. Radiation oncology was consulted due to patient's complaints of severe pain in right groin area. He started RT 7/21 with pain significantly improved in right groin area. CT guided biopsy of retroperitoneal soft tissue mass is pending.   Assessment and Plan:  Principal Problem:  Intractable right groin pain, back pain secondary to lumbar spine tumor / bone metastasis / possible multiple myeloma No current evidence of cord compression. No neurological complaints today but he did have numbness in right groin area on 7/21.  Radiation oncology consulted. I appreciate their input. With RT started 7/21 to this area pt already feels better with no pain this am and sensation back to normal. I spoke with oncology on call and recommendation was to obtain UPEP, SPEP, Kappa/Lambda light chains for further evaluation. Calcium if WNL, renal function is WNL. Hemoglobin only mildly decreased at 12.1. Continue Decadron 4 mg by mouth every 6 hours.  CT guided biopsy or  retroperitoneal soft tissue mass performed 07/15/2014, biopsy results pending   Pain control with Dilaudid PCA, dilaudid IV PRN for breakthrough pain. Also scheduled oxycontin 40 mg PO Q 12 hours  Active Problems:  TOBACCO ABUSE   Nicotine patch ordered. Tobacco cessation counseling per nursing staff. Anemia of chronic disease  Likely due to malignancy  No signs of active bleeding  Hemoglobin is12.1 COPD   Continue Symbicort, Spiriva and albuterol when necessary.   Has mild wheezing on physical exam but no respiratory distress Unspecified hereditary and idiopathic peripheral neuropathy   Burning pain in legs improved. Lyrica started. Loss of weight / severe protein calorie malnutrition   Cancer related cachexia. Dietitian consulted Anxiety state, unspecified   Ativan ordered as needed. DVT prophylaxis  Lovenox ordered while inpatient   Code Status: Full  Family Communication: Pt and wife at bedside  Disposition Plan: Home when medically stable   Consultants:  Interventional radiology for CT guided biopsy Radiation oncology Oncology  Procedures/Studies:  MRI lumbar spine 07/13/14:1. Unfortunately there are enhancing tumors in most of the lumbar vertebra, the sacrum, paraspinal tissues, subcutaneous tissues, retroperitoneum, and perirenal spaces most compatible with a diffuse metastatic process. Oncology referral with staging workup to determine a suitable biopsy site recommended. 2. Cortical breakthrough from the L3 vertebral body on the right side leads to epidural tumor and tumor in the right neural foramen at L2-3. There is also involvement of the S1 and S2 neural foramina by tumor and sacral epidural tumor as noted above. 3. Spondylosis and degenerative disc disease also cause moderate impingement at L3-4 and mild impingement at T11-12 and L4-5, as  noted above.  CT of the chest, abdomen and pelvis 07/13/14: Bulky mediastinal lymphadenopathy with mild lymphadenopathy also  seen in the right axilla, bilateral pericardial spaces, and retrocrural regions, consistent metastatic disease. Diffuse body wall soft tissue metastases throughout the chest, abdomen, and pelvis. Diffuse mesenteric and retroperitoneal metastases throughout the abdomen and pelvis. Diffuse lytic bone metastases. Probable small splenic metastases.  Ct Biopsy 07/15/2014 CT-guided core biopsy of right retroperitoneal soft tissue mass.  Antibiotics:  None   [name], MD  [facility] Pager [phone]  If 7PM-7AM, please contact night-coverage [email or web address] Password [facility] 07/17/2014, 1:59 PM   LOS: 4 days    HPI/Subjective: No acute overnight events. Experiences lot of pain.  Objective: Filed Vitals:   07/16/14 2004 07/16/14 2353 07/17/14 0510 07/17/14 0849  BP:  159/80 149/75   Pulse:  79 78   Temp:  97.5 F (36.4 C) 97.6 F (36.4 C)   TempSrc:  Axillary Axillary   Resp:  16 16   Height:      Weight:      SpO2: 93% 94% 93% 94%    Intake/Output Summary (Last 24 hours) at 07/17/14 1359 Last data filed at 07/17/14 0720  Gross per 24 hour  Intake    240 ml  Output    550 ml  Net   -310 ml    Exam:   General:  Pt is alert, follows commands appropriately, not in acute distress  Cardiovascular: Regular rate and rhythm, S1/S2, no murmurs  Respiratory: wheezing in mid lung lobe on right, some congestion  Abdomen: Soft, non tender, non distended, bowel sounds present  Extremities: No edema, pulses DP and PT palpable bilaterally  Neuro: Grossly nonfocal  Data Reviewed: Basic Metabolic Panel:  Recent Labs Lab 07/13/14 0342 07/16/14 0003 07/17/14 0458  NA 137 138 137  K 3.9 4.4 4.2  CL 96 100 98  CO2 _0 GLUCOSE 96 104* 142*  BUN 15 22 27*  CREATININE 0.77 0.83 0.73  CALCIUM 9.0 8.8 8.9   Liver Function Tests:  Recent Labs Lab 07/13/14 0342  AST 20  ALT 11  ALKPHOS 127*  BILITOT 0.4  PROT 6.8  ALBUMIN 3.4*   No results found for this  basename: LIPASE, AMYLASE,  in the last 168 hours No results found for this basename: AMMONIA,  in the last 168 hours CBC:  Recent Labs Lab 07/13/14 0342 07/16/14 0003 07/17/14 0458  WBC 9.1 8.0 11.2*  HGB 12.7* 11.5* 12.1*  HCT 37.5* 34.3* 35.7*  MCV 89.1 88.4 88.4  PLT 326 289 297   Cardiac Enzymes: No results found for this basename: CKTOTAL, CKMB, CKMBINDEX, TROPONINI,  in the last 168 hours BNP: No components found with this basename: POCBNP,  CBG: No results found for this basename: GLUCAP,  in the last 168 hours  No results found for this or any previous visit (from the past 240 hour(s)).   Studies: Dg Shoulder Left Port  07/17/2014   CLINICAL DATA:  Bilateral shoulder pain. History of multiple myeloma.  EXAM: PORTABLE LEFT SHOULDER - 2+ VIEW  COMPARISON:  Chest radiograph 04/23/2014  FINDINGS: Calcifications in the left lower neck are consistent with carotid artery calcifications. The left shoulder is located without a fracture. No suspicious lucent lesions in the bones. The left acromial-humeral interval appears narrowed on these images but could be related to projection.  IMPRESSION: No acute bone abnormality to the left shoulder.  Difficult to exclude underlying rotator cuff disease.  Evidence for  carotid artery calcifications.   Electronically Signed   By: Markus Daft M.D.   On: 07/17/2014 10:33   Dg Shoulder Right Port  07/17/2014   CLINICAL DATA:  Bilateral shoulder pain.  EXAM: PORTABLE RIGHT SHOULDER - 2+ VIEW  COMPARISON:  Chest radiograph 04/23/2014 and chest CT 07/13/2014  FINDINGS: Right shoulder is located without a fracture. Visualized right ribs are intact. No suspicious lucent bone lesions. Right AC joint is intact. Soft tissue fullness along the right side of the mediastinum is compatible with known lymphadenopathy.  IMPRESSION: No acute bone abnormality to the right shoulder.  Mediastinal fullness is consistent with known mediastinal lymphadenopathy.    Electronically Signed   By: Markus Daft M.D.   On: 07/17/2014 10:36    Scheduled Meds: . antiseptic oral rinse  15 mL Mouth Rinse BID  . budesonide-formoterol  2 puff Inhalation QHS  . dexamethasone  4 mg Oral 4 times per day  . enoxaparin (LOVENOX) injection  40 mg Subcutaneous Q24H  . feeding supplement (ENSURE COMPLETE)  237 mL Oral TID BM  . fentaNYL  75 mcg Transdermal Q72H  . HYDROmorphone PCA 0.3 mg/mL   Intravenous 6 times per day  . LORazepam  1 mg Oral BID  . montelukast  10 mg Oral q morning - 10a  . nicotine  21 mg Transdermal Daily  . OxyCODONE  40 mg Oral Q12H  . pantoprazole  40 mg Oral q morning - 10a  . PARoxetine  10 mg Oral q morning - 10a  . pregabalin  75 mg Oral BID  . senna  1 tablet Oral BID  . tiotropium  18 mcg Inhalation Daily   Continuous Infusions:

## 2014-07-17 NOTE — Progress Notes (Signed)
Cross covering note:  I spoke with Nurse and based on [employer] guidelines for toxicity modification from [email or web address].  They recommend to administer 100% dose for ANC greater than 1,000 or Plt count greater than 100,000.  His ANC is 1,200 and Plts 159,000.  We will therefore administer 100%.

## 2014-07-17 NOTE — Consult Note (Signed)
Patient TK:ZSWFUXN HARRIET BOLLEN      DOB: 1951-09-18      ATF:[id number]     Consult Note from the Palliative Medicine Team at [facility] Requested by: Dr Charlies Silvers     PCP: [name], MD Reason for Consultation:Pain Management    Phone Number:[phone]  Assessment/Plan:  63 yo male with chronic back/leg pain, , HTN, COPD, remote h.o cancer in left eye who presented with severe back pain and found to have likely widely metastatic carcinoma.   Goals of Care: 1.  Code Status: Full  2. Illness Understanding/GOC- Mr Shidler felt like he knew he had cancer prior to being admitted to hospital 2/2 severe nature of his pain.  At one point, he alluded to not thinking he would make it out of the hospital alive.  He appears to be able to ambulate independently and was fairly functional prior to this acute pain crisis.  He will meet with Oncology today to discuss plans as we await pathology report. Plans to continue with XRT which he tolerated better today.    3. Symptom Management:   1. Cancer Related Pain- Difficult situation with his chronic pain being treated with Fentanyl 96mcg/hr patch as well as Oxycontin 40mg  BID.  Now severe pain with metastatic bone disease.  Agree with decadron, toradol.  I will go ahead and switch him to one long acting pain medicine by d/c'ing fentanyl patch.  He used ~18mg  of IV dilaudid for breakthrough pain alone in past 24h (roughly equivelant to 360mg  of oral morphine).  I will go ahead and change his oxycontin to 80mg  TID which is fairly conservative considering removal of fentanyl patch an dilaudid usage. May need further titration. Hopefully ongoing XRT will help. Other consideration would be bisphosphonate (best evidence in bone pain for breast and myeloma). Continue PCA for now and PRN nurse driven dilaudid in case he does not tolerate titration well. Also on lyrica for chronic neuropathic pain. Suspect pain will be challenging to treat given  his chronic pain and degree of chronic opioid use.    -Please feel free to call on call phone [phone] if urgent questions arise.  2. Bowel Regimen: Last BM 7/21. Senna. Monitor closely with opioids 3. Wt Loss: Reports losing >30lbs recently. Appetite poor throughout day. Steroids will certainly help. Will try remeron as sleeping poorly  4. Nausea/Vomiting: Improved here with pain control and steroids. Remeron will help with this as well. Has PRN zofran 5. Insomnia- Has prn ambien. remeron as above  6. Anxiety/Depression- chronic on Paxil and bid ativan.   4. Psychosocial/Spiritual: Married for >40 years to his wife Coralyn Mark.  They have 3 biological children and 2 adopted children.  Most family lives near [place] area.  Retired from [employer].        Brief HPI: 63 yo male with PMHx of chronic back/leg pain, , HTN, COPD, remote h.o cancer in left eye, tobacco abuse.  He presented to ED with several month history of wrosening low back pain with radiation mstly down his right leg and into scrotum.  He had acute worsening for several days prior to hospital admission.  He describes pain as a gnawing/deep pain.  He had seen his PCP several times for this, but never could figure out etiology until this admission where likely wide spread metastatic carcinoma was identified.  He has chronic back/leg pain for which he tells me he has been on a fentanyl patch @75mcg /hr and oxycontin 40mg  BID  for >7 years.  He recently had been taking 1-2 extra oxycontin as a PRN breakthrough dose as he had no other breakthrough medicines at home. Feels like Fentanyl patch is less helpful than oxycodone because he has had it fall off before for few days and not noticed worsened pain. He had seen a pain clinic once, but said he was denied care there because opioids were identified in his drug screen. He denies any street drug use since 1972.  In addition to above, he reports a 30lb wt loss over past few months with nausea and vomiting  also problematic. He has been sleeping poorly as well.   Since admission, he has been started on steroids, PRN dilaudid and now PCA dilaudid.  He also has undergone 2 radiation treatments which he all believes have helped his pain.  Currently his back pain is 6/10 which is improved from prior to admission.  Nausea now improved as well.  Appetite seems better.  He is awaiting pathology results and meeting with Oncology.  He alluded to not thinking he would survive this hospitalization. Discussed ongoing care and importance of ongoing care discussions with multiple providers to help formulate best treatment plan.    ROS: Full ROS negative unless otherwise mentioned above.     PMH:  Past Medical History  Diagnosis Date  . Chest pain     previous CP thought r/t GI; mild CAD by cath 2002  . Cancer Left    of the eye  . Hypertension     pt denies h/o htn  . COPD (chronic obstructive pulmonary disease)   . Hyperlipidemia     taken off statin 1 mo ago by primary care bc he was "put on a new med that might interfere"  . Tobacco abuse   . 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 Gout   . Shortness of breath   . Syncope      PSH: Past Surgical History  Procedure Laterality Date  . Shoulder surgery    . Neck surgery    . Eye surgery      cancer  . Spine surgery    . Knee cartilage surgery    . Cholecystectomy     I have reviewed the FH and SH and  If appropriate update it with new information. Allergies  Allergen Reactions  . Demerol [Meperidine] Nausea Only  . Meperidine Hcl Nausea And Vomiting   Scheduled Meds: . antiseptic oral rinse  15 mL Mouth Rinse BID  . budesonide-formoterol  2 puff Inhalation QHS  . dexamethasone  4 mg Oral 4 times per day  . enoxaparin (LOVENOX) injection  40 mg Subcutaneous Q24H  . feeding supplement (ENSURE COMPLETE)  237 mL Oral TID BM  . fentaNYL  75 mcg Transdermal Q72H  . HYDROmorphone PCA 0.3 mg/mL   Intravenous 6 times per  day  . LORazepam  1 mg Oral BID  . montelukast  10 mg Oral q morning - 10a  . nicotine  21 mg Transdermal Daily  . OxyCODONE  40 mg Oral Q12H  . pantoprazole  40 mg Oral q morning - 10a  . PARoxetine  10 mg Oral q morning - 10a  . pregabalin  75 mg Oral BID  . senna  1 tablet Oral BID  . tiotropium  18 mcg Inhalation Daily   Continuous Infusions:  PRN Meds:.acetaminophen, acetaminophen, alum & mag hydroxide-simeth, diphenhydrAMINE, diphenhydrAMINE, HYDROmorphone (DILAUDID) injection, ketorolac, levalbuterol, naloxone,  nitroGLYCERIN, ondansetron (ZOFRAN) IV, ondansetron (ZOFRAN) IV, ondansetron, polyethylene glycol, sodium chloride, zolpidem    BP 149/75  Pulse 78  Temp(Src) 97.6 F (36.4 C) (Axillary)  Resp 16  Ht 5\' 9"  (1.753 m)  Wt 74.662 kg (164 lb 9.6 oz)  BMI 24.30 kg/m2  SpO2 94%   ECOG: 1   Intake/Output Summary (Last 24 hours) at 07/17/14 1502 Last data filed at 07/17/14 0720  Gross per 24 hour  Intake      0 ml  Output    550 ml  Net   -550 ml   LBM: 7/21                      Physical Exam:  General: Alert, NAD HEENT:  [place], mmm, sclera anicteric Neck: Supple Chest:   Ctab, symm expansion CVS: RRR Abdomen:soft, NT, ND Ext: no c/c/e Neuro: lower ext power +5/5 and symmetrical  Skin: warm/dry  Labs: CBC    Component Value Date/Time   WBC 11.2* 07/17/2014 0458   RBC 4.04* 07/17/2014 0458   HGB 12.1* 07/17/2014 0458   HCT 35.7* 07/17/2014 0458   PLT 297 07/17/2014 0458   MCV 88.4 07/17/2014 0458   MCH 30.0 07/17/2014 0458   MCHC 33.9 07/17/2014 0458   RDW 13.6 07/17/2014 0458   LYMPHSABS 0.9 01/27/2012 0909   MONOABS 0.5 01/27/2012 0909   EOSABS 0.0 01/27/2012 0909   BASOSABS 0.0 01/27/2012 0909    BMET    Component Value Date/Time   NA 137 07/17/2014 0458   K 4.2 07/17/2014 0458   CL 98 07/17/2014 0458   CO2 27 07/17/2014 0458   GLUCOSE 142* 07/17/2014 0458   BUN 27* 07/17/2014 0458   CREATININE 0.73 07/17/2014 0458   CALCIUM 8.9 07/17/2014 0458    GFRNONAA >90 07/17/2014 0458   GFRAA >90 07/17/2014 0458    CMP     Component Value Date/Time   NA 137 07/17/2014 0458   K 4.2 07/17/2014 0458   CL 98 07/17/2014 0458   CO2 27 07/17/2014 0458   GLUCOSE 142* 07/17/2014 0458   BUN 27* 07/17/2014 0458   CREATININE 0.73 07/17/2014 0458   CALCIUM 8.9 07/17/2014 0458   PROT 6.8 07/13/2014 0342   ALBUMIN 3.4* 07/13/2014 0342   AST 20 07/13/2014 0342   ALT 11 07/13/2014 0342   ALKPHOS 127* 07/13/2014 0342   BILITOT 0.4 07/13/2014 0342   GFRNONAA >90 07/17/2014 0458   GFRAA >90 07/17/2014 0458    7/18 MRI Lumbar Spine IMPRESSION:  1. Unfortunately there are enhancing tumors in most of the lumbar  vertebra, the sacrum, paraspinal tissues, subcutaneous tissues,  retroperitoneum, and perirenal spaces most compatible with a diffuse  metastatic process. Oncology referral with staging workup to  determine a suitable biopsy site recommended.  2. Cortical breakthrough from the L3 vertebral body on the right  side leads to epidural tumor and tumor in the right neural foramen  at L2-3. There is also involvement of the S1 and S2 neural foramina  by tumor and sacral epidural tumor as noted above.  3. Spondylosis and degenerative disc disease also cause moderate  impingement at L3-4 and mild impingement at T11-12 and L4-5, as  noted above.  These results were called by telephone at the time of interpretation  on 07/13/2014 at 10:00 am to Dr. Reather Converse, who verbally acknowledged  these results.   7/18 CT Chest/Abd/Pelvis IMPRESSION:  Bulky mediastinal lymphadenopathy with mild lymphadenopathy also  seen in the  right axilla, bilateral pericardial spaces, and  retrocrural regions, consistent metastatic disease.  Diffuse body wall soft tissue metastases throughout the chest,  abdomen, and pelvis.  Diffuse mesenteric and retroperitoneal metastases throughout the  abdomen and pelvis.  Diffuse lytic bone metastases.  Probable small splenic metastases.       Time In Time Out Total Time Spent with Patient Total Overall Time  2PM 3PM 45 minutes 60 minutes    Greater than 50%  of this time was spent counseling and coordinating care related to the above assessment and plan.   Doran Clay D.O. Palliative Medicine Team at [facility]  Pager: [phone] Team Phone: [phone]

## 2014-07-18 ENCOUNTER — Ambulatory Visit
Admit: 2014-07-18 | Discharge: 2014-07-18 | Disposition: A | Discharge status: Home / Self Care | Payer: 59 | Attending: Radiation Oncology | Admitting: Radiation Oncology

## 2014-07-18 DIAGNOSIS — R5381 Other malaise: Secondary | ICD-10-CM

## 2014-07-18 LAB — KAPPA/LAMBDA LIGHT CHAINS
Kappa free light chain: 1.15 mg/dL (ref 0.33–1.94)
Kappa, lambda light chain ratio: 0.93 (ref 0.26–1.65)
Lambda free light chains: 1.23 mg/dL (ref 0.57–2.63)

## 2014-07-18 MED ORDER — HYDROMORPHONE HCL PF 2 MG/ML IJ SOLN
2.0000 mg | Freq: Once | INTRAMUSCULAR | Status: AC
  Administered 2014-07-18: 2 mg via INTRAVENOUS

## 2014-07-18 MED ORDER — OXYCODONE HCL ER 40 MG PO T12A: 80.0000 mg | EXTENDED_RELEASE_TABLET | Freq: Two times a day (BID) | ORAL | Status: DC

## 2014-07-18 MED ORDER — OXYCODONE HCL ER 40 MG PO T12A
80.0000 mg | EXTENDED_RELEASE_TABLET | Freq: Three times a day (TID) | ORAL | Status: DC
  Administered 2014-07-18 – 2014-07-20 (×6): 80 mg via ORAL
  Filled 2014-07-18 (×6): qty 2

## 2014-07-18 NOTE — Progress Notes (Signed)
[facility] Dept Therapy Treatment Record Phone [phone]   Radiation Therapy was administered to Nicholas Soto on: 07/18/2014  4:36 PM and was treatment # 3 out of a planned course of 10 treatments.

## 2014-07-18 NOTE — Progress Notes (Signed)
Patient found writhing in pain. Rates 10/10 in legs. PCA encouraged and breakthrough dose given. Pt continues to be extremely uncomfortable and tearful. MD notified. Orders for one time dose of Dil. IV given . Will continue to monitor.

## 2014-07-18 NOTE — Progress Notes (Signed)
Patient Nicholas Soto      DOB: Dec 29, 1950      RAX:[id number]   Palliative Medicine Team at [facility] Progress Note    Subjective: Pain doing much better this morning. Slept well last night, but some concern that he may have been over sedated.  Was actually pain free at times overnight.  No nausea. Not feeling anxious/depressed. Frustrated by uncertainty of type of cancer and what treatment plan may be.      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   1. Code Status: Full   2. Illness Understanding/GOC- See initial consult note.  He is awaiting pathology results to figure out where to go from here. Feeling frustrated by this uncertainty.     3. Symptom Mangement  A. Cancer related pain- was doing very well this morning and even felt sleepy.  May have been because fentanyl hanging around in system still.  This afternoon received call that he woke with severe pain. I was hopeful that medication change and XRT were going to continue to improve pain rapidly and backed down from oxy 80mg  TID to BID. Given events this afternoon, will go ahead and increase oxycontin back to TID.  Hopefully pain settles down when he gets caught up with PCA after sleeping.   B. Wt Loss- Started Remeron. monitor appetite  C. Nausea/vomiting- improved  D. Insomnia-reports that he slept well last night. Continue remeron and pain control   E. Anxiety/Depression- on ativan/paxil at home. Started remeron as above. Monitor    4. Psychosocial/Spiritual: Married for >40 years to his wife Nicholas Soto. They have 3 biological children and 2 adopted children.  Most family lives near [place] area. Retired from [employer].   Time In: 1020 Time Out: 1045 Total Time: 25 minutes  Doran Clay D.O. Palliative Medicine Team at [facility]  Pager: [phone] Team Phone: [phone]

## 2014-07-18 NOTE — Progress Notes (Signed)
Vitals took off PCA pump. Pt resting in no distress at this time, dpi given.

## 2014-07-18 NOTE — Care Management Note (Signed)
  Page 1 of 1   07/18/2014     2:54:32 PM CARE MANAGEMENT NOTE 07/18/2014  Patient:  Nicholas Soto, Nicholas Soto   Account Number:  [id number]  Date Initiated:  07/18/2014  Documentation initiated by:  [facility]  Subjective/Objective Assessment:   adm: Bony Mets from Ocular [facility]     Action/Plan:   discharge planning CIR vs SNF   Anticipated DC Date:  07/22/2014   Anticipated DC Plan:  IP REHAB FACILITY      DC Planning Services  CM consult      Choice offered to / List presented to:             Status of service:  Completed, signed off Medicare Important Message given?   (If response is "NO", the following Medicare IM given date fields will be blank) Date Medicare IM given:   Medicare IM given by:   Date Additional Medicare IM given:   Additional Medicare IM given by:    Discharge Disposition:    Per UR Regulation:    If discussed at Long Length of Stay Meetings, dates discussed:    Comments:  07/18/14 14:30 CM and [name] met with pt in room.  CM explained to pt, though CIR consult is still in place, he may not be a candidate for IP rehab as he is not at a level of comfort to participate in the agressive IP therapy and SNF may be a better environment.  Pt unable to participate in conversation as pain is too great.  RN was notified for pain control.  Will pursue at a later time when pt's pain is better controlled and he can participate in discussing discharge options. Mariane Masters, BSN, [place] [phone].   07/17/14 10:00 CM called CIR and spoke with Pamala Hurry about consult.  Md to place order and PT to eval.  Pamala Hurry states she will begin the process.  CM will monitor for needs. Mariane Masters, BSN, CM [phone].

## 2014-07-18 NOTE — Progress Notes (Addendum)
Patient ID: [name], male   DOB: 01-19-1951, 62 y.o.   MRN: [id number] [facility] PROGRESS NOTE  Nicholas Soto YTK:[id number] DOB: 04-03-1951 DOA: 07/13/2014 PCP: Nicholas Bill, MD  Brief narrative: 63 y.o. 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 Specifically, MRI of the lumbar spine on 07/13/14 showed widespread bony metastatic disease including L3 vertebral body on the right side leading to epidural tumor and tumor in the right neural foramen at L2-3. There was also involvement of the S1 and S2 neural foramina by tumor and sacral epidural tumor (S2 was on the left side). CT abdomen/ pelvis showed widespread disease in multiple lymph node regions and diffuse lytic bone mets. Radiation oncology was consulted due to patient's complaints of severe pain in right groin area. He started RT 7/21 with pain significantly improved in right groin area. CT guided biopsy of retroperitoneal soft tissue mass is still pending.   Assessment and Plan:   Principal Problem:  Intractable right groin pain, back pain secondary to lumbar spine tumor / bone metastasis / possible multiple myeloma  No current evidence of cord compression. No neurological complaints at this time but as mentioned previously pt did have numbness in right groin area on 7/21. Radiation oncology consulted and pt started RT 7/21 to this area. Pt continues to feel little better but still has pain. His pain is currently controlled with dilaudid PCA and dilaudid 2 mg IV every 2 hours PRN for breakthrough pain. Oxycontin 80 mg Q 12 hours PO scheduled.  Adjuvant therapy: lyrica  I appreciate oncology following. Follow up UPEP, SPEP, kappa, lambda light chains. Calcium, renal  function WNL. Hemoglobin 12.1.  Continue Decadron 4 mg by mouth every 6 hours.  CT guided biopsy of retroperitoneal soft tissue mass performed 07/15/2014, biopsy results pending  Active Problems:  TOBACCO ABUSE  Nicotine patch ordered. Tobacco cessation counseling per nursing staff. Anemia of chronic disease  Likely due to malignancy  No signs of active bleeding  Hemoglobin is12.1 Chronic obstructive pulmonary disease   Continue Symbicort, Spiriva and albuterol when necessary.  Unspecified hereditary and idiopathic peripheral neuropathy  Burning pain in legs improved. Continue lyrica 75 mg PO BID. Loss of weight / severe protein calorie malnutrition  Cancer related cachexia. Dietitian consulted Anxiety state, unspecified  Ativan ordered as needed. DVT prophylaxis  Lovenox ordered while inpatient    Code Status: Full  Family Communication: Pt and wife at bedside  Disposition Plan: likely inpatient rehab once pt stable    Consultants:  Interventional radiology for CT guided biopsy  Radiation oncology  Oncology  Procedures/Studies:  MRI lumbar spine 07/13/14:1. Unfortunately there are enhancing tumors in most of the lumbar vertebra, the sacrum, paraspinal tissues, subcutaneous tissues, retroperitoneum, and perirenal spaces most compatible with a diffuse metastatic process. Oncology referral with staging workup to determine a suitable biopsy site recommended. 2. Cortical breakthrough from the L3 vertebral body on the right side leads to epidural tumor and tumor in the right neural foramen at L2-3. There is also involvement of the S1 and S2 neural foramina by tumor and sacral epidural tumor as noted above. 3. Spondylosis and degenerative disc disease also cause moderate impingement at L3-4 and mild impingement at T11-12 and L4-5, as noted above.  CT  of the chest, abdomen and pelvis 07/13/14: Bulky mediastinal lymphadenopathy with mild lymphadenopathy also seen in the right axilla, bilateral  pericardial spaces, and retrocrural regions, consistent metastatic disease. Diffuse body wall soft tissue metastases throughout the chest, abdomen, and pelvis. Diffuse mesenteric and retroperitoneal metastases throughout the abdomen and pelvis. Diffuse lytic bone metastases. Probable small splenic metastases.  Ct Biopsy 07/15/2014 CT-guided core biopsy of right retroperitoneal soft tissue mass.  Antibiotics:  None    [name], MD  [facility] Pager [phone]  If 7PM-7AM, please contact night-coverage [email or web address] Password [email or web address] 07/18/2014, 7:01 AM   LOS: 5 days    HPI/Subjective: No acute overnight events.  Objective: Filed Vitals:   07/17/14 2040 07/17/14 2322 07/18/14 0430 07/18/14 0535  BP:    116/60  Pulse:    69  Temp:    97.6 F (36.4 C)  TempSrc:    Oral  Resp:  _0 Height:      Weight:      SpO2: 92% 96% 93% 95%    Intake/Output Summary (Last 24 hours) at 07/18/14 0701 Last data filed at 07/18/14 0606  Gross per 24 hour  Intake 260.67 ml  Output   2800 ml  Net -2539.33 ml    Exam:   General:  Pt is alert, follows commands appropriately, not in acute distress  Cardiovascular: Regular rate and rhythm, S1/S2, no murmurs  Respiratory: Clear to auscultation bilaterally, no wheezing, no crackles, no rhonchi  Abdomen: Soft, non tender, non distended, bowel sounds present  Extremities: No edema, pulses DP and PT palpable bilaterally  Neuro: Grossly nonfocal  Data Reviewed: Basic Metabolic Panel:  Recent Labs Lab 07/13/14 0342 07/16/14 0003 07/17/14 0458  NA 137 138 137  K 3.9 4.4 4.2  CL 96 100 98  CO2 _1 GLUCOSE 96 104* 142*  BUN 15 22 27*  CREATININE 0.77 0.83 0.73  CALCIUM 9.0 8.8 8.9   Liver Function Tests:  Recent Labs Lab 07/13/14 0342  AST 20  ALT 11  ALKPHOS 127*  BILITOT 0.4  PROT 6.8  ALBUMIN 3.4*   No results found for this basename: LIPASE, AMYLASE,  in the last 168 hours No results found for this  basename: AMMONIA,  in the last 168 hours CBC:  Recent Labs Lab 07/13/14 0342 07/16/14 0003 07/17/14 0458  WBC 9.1 8.0 11.2*  HGB 12.7* 11.5* 12.1*  HCT 37.5* 34.3* 35.7*  MCV 89.1 88.4 88.4  PLT 326 289 297   Cardiac Enzymes: No results found for this basename: CKTOTAL, CKMB, CKMBINDEX, TROPONINI,  in the last 168 hours BNP: No components found with this basename: POCBNP,  CBG: No results found for this basename: GLUCAP,  in the last 168 hours  No results found for this or any previous visit (from the past 240 hour(s)).   Studies: Dg Shoulder Left Port  07/17/2014   CLINICAL DATA:  Bilateral shoulder pain. History of multiple myeloma.  EXAM: PORTABLE LEFT SHOULDER - 2+ VIEW  COMPARISON:  Chest radiograph 04/23/2014  FINDINGS: Calcifications in the left lower neck are consistent with carotid artery calcifications. The left shoulder is located without a fracture. No suspicious lucent lesions in the bones. The left acromial-humeral interval appears narrowed on these images but could be related to projection.  IMPRESSION: No acute bone abnormality to the left shoulder.  Difficult to exclude underlying rotator cuff disease.  Evidence for carotid artery calcifications.   Electronically Signed   By: Scherrie Gerlach.D.  On: 07/17/2014 10:33   Dg Shoulder Right Port  07/17/2014   CLINICAL DATA:  Bilateral shoulder pain.  EXAM: PORTABLE RIGHT SHOULDER - 2+ VIEW  COMPARISON:  Chest radiograph 04/23/2014 and chest CT 07/13/2014  FINDINGS: Right shoulder is located without a fracture. Visualized right ribs are intact. No suspicious lucent bone lesions. Right AC joint is intact. Soft tissue fullness along the right side of the mediastinum is compatible with known lymphadenopathy.  IMPRESSION: No acute bone abnormality to the right shoulder.  Mediastinal fullness is consistent with known mediastinal lymphadenopathy.   Electronically Signed   By: Markus Daft M.D.   On: 07/17/2014 10:36    Scheduled  Meds: . antiseptic oral rinse  15 mL Mouth Rinse BID  . budesonide-formoterol  2 puff Inhalation QHS  . dexamethasone  4 mg Oral 4 times per day  . enoxaparin (LOVENOX) injection  40 mg Subcutaneous Q24H  . feeding supplement (ENSURE COMPLETE)  237 mL Oral TID BM  . HYDROmorphone PCA 0.3 mg/mL   Intravenous 6 times per day  . LORazepam  1 mg Oral BID  . mirtazapine  15 mg Oral QHS  . montelukast  10 mg Oral q morning - 10a  . nicotine  21 mg Transdermal Daily  . OxyCODONE  80 mg Oral 3 times per day  . pantoprazole  40 mg Oral q morning - 10a  . PARoxetine  10 mg Oral q morning - 10a  . pregabalin  75 mg Oral BID  . senna  1 tablet Oral BID  . tiotropium  18 mcg Inhalation Daily   Continuous Infusions:

## 2014-07-18 NOTE — Progress Notes (Signed)
[name] received referral that PT recommended CIR, but SNF needed to be explored as secondary option.  [name] and RNCM met with pt at bedside. [name] and RNCM introduced self and explained roles. RNCM discussed PT recommendation for CIR and consult in place, but concern is that pt may not be able to tolerate the aggressive therapy that CIR offers and that rehab at SNF would be option if CIR feels pt not a candidate for CIR. Pt in an extensive amount of pain at this time and unable to adequately participate in assessment. RNCM notified RN for pain control.   [name] to follow up at a later time when pt's pain is better controlled and he can participate in discussing discharge options. [name] to complete full psychosocial assessment at that time.  [name], MSW, [place] [employer] [phone]

## 2014-07-19 ENCOUNTER — Ambulatory Visit
Admit: 2014-07-19 | Discharge: 2014-07-19 | Disposition: A | Discharge status: Home / Self Care | Payer: 59 | Attending: Radiation Oncology | Admitting: Radiation Oncology

## 2014-07-19 DIAGNOSIS — C801 Malignant (primary) neoplasm, unspecified: Secondary | ICD-10-CM

## 2014-07-19 DIAGNOSIS — G893 Neoplasm related pain (acute) (chronic): Secondary | ICD-10-CM

## 2014-07-19 DIAGNOSIS — J441 Chronic obstructive pulmonary disease with (acute) exacerbation: Secondary | ICD-10-CM

## 2014-07-19 LAB — PROTEIN ELECTROPHORESIS, SERUM
Albumin ELP: 54.4 % — ABNORMAL LOW (ref 55.8–66.1)
Alpha-1-Globulin: 10.9 % — ABNORMAL HIGH (ref 2.9–4.9)
Alpha-2-Globulin: 12.5 % — ABNORMAL HIGH (ref 7.1–11.8)
BETA 2: 4 % (ref 3.2–6.5)
Beta Globulin: 6.4 % (ref 4.7–7.2)
Gamma Globulin: 11.8 % (ref 11.1–18.8)
M-Spike, %: NOT DETECTED g/dL
Total Protein ELP: 6 g/dL (ref 6.0–8.3)

## 2014-07-19 MED ORDER — HYDROMORPHONE 0.3 MG/ML IV SOLN: INTRAVENOUS | Status: DC

## 2014-07-19 MED ORDER — HYDROMORPHONE 0.3 MG/ML IV SOLN
INTRAVENOUS | Status: DC
  Administered 2014-07-19: 7.8 mg via INTRAVENOUS
  Administered 2014-07-19: 0.1 mg via INTRAVENOUS
  Administered 2014-07-19: 17:00:00 via INTRAVENOUS
  Administered 2014-07-20: 0.5 mg via INTRAVENOUS
  Administered 2014-07-20: 5.81 mg via INTRAVENOUS
  Administered 2014-07-20: 05:00:00 via INTRAVENOUS
  Administered 2014-07-20: 2.29 mg via INTRAVENOUS
  Filled 2014-07-19 (×2): qty 25

## 2014-07-19 MED ORDER — ENOXAPARIN SODIUM 40 MG/0.4ML ~~LOC~~ SOLN
40.0000 mg | SUBCUTANEOUS | Status: DC
  Administered 2014-07-20 – 2014-07-23 (×4): 40 mg via SUBCUTANEOUS
  Filled 2014-07-19 (×5): qty 0.4

## 2014-07-19 NOTE — Progress Notes (Signed)
Rehab admissions - Evaluated for possible admission.  Please see rehab consult done by Dr. Letta Pate.  Patient not able to tolerate acute inpatient rehab therapies.  Recommend pursuit of SNF.  Call me for questions.  #[phone]

## 2014-07-19 NOTE — Progress Notes (Signed)
[facility] Therapy Treatment Record Phone [phone]   Radiation Therapy was administered to Nicholas Soto on: 07/19/2014  10:40 AM and was treatment # 4 out of a planned course of 10 treatments.

## 2014-07-19 NOTE — Progress Notes (Signed)
Patient OL:MBEMLJQ MCCABE GLORIA      DOB: 1951-06-27      GBE:[id number]   Palliative Medicine Team at [facility] Progress Note    Subjective: Pain overall doing better, but still 6/10 this morning Reporting 10/10 at times.  Slept heavily last night however,  Appears to be receiving ~4-5 demand doses per hour when i interrogate his PCA this morning. He does not feel like this offers him significant relief.  Tolerated radiation after several large bolus doses of IV dilaudid yesterday.  No n/v.  Ambulating some but makes pain worse.      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   1. Code Status: Full   2. Illness Understanding/GOC- See initial consult note. Path with poorly differentiated adenocarcinoma.  Awaiting Oncology input. Need to determine rehab options with ongoing pain and symptom control.    3. Symptom Mangement  A. Cancer related pain- pain doing better this morning. Reports sleeping well, but still periods of 10/10 pain. His underlying chronic pain, remote history of substance abuse, and cancer related pain make this challenging case. Would continue adjuvant NSAID, steroids (can consider changing to BID or daily dose), lyrica.  Will continue with oxycontin 80mg  BID and see how his pain dose with ongoing XRT.  I will increase his PCA dose of dilaudid to 0.5mg  but increase interval to q38min.  Will see how he does with radiation.  Likely need to avoid bisphos as he has very poor dentition and needs several teeth extracted per his report.  B. Wt Loss- Started Remeron. monitor appetite  C. Insomnia-reports that he slept  well last night. Continue remeron and pain control  D. Anxiety/Depression- on ativan/paxil at home. Started remeron as above. Monitor   4. Psychosocial/Spiritual: Married for >40 years to his wife Coralyn Mark. They have 3 biological children and 2 adopted children. Most family lives near [place] area. Retired from [employer].    Doran Clay D.O. Palliative Medicine Team at [facility]  Pager: [phone] Team Phone: [phone]

## 2014-07-19 NOTE — Progress Notes (Signed)
Nicholas Soto   DOB:1951-10-02   TK#:[id number]   FTD#:[id number]  Subjective: patient voices improvement in pain since receiving XRT.  His wife is at bedside.    Objective:  Filed Vitals:   07/19/14 0834  BP:   Pulse:   Temp:   Resp: 15    Body mass index is 24.3 kg/(m^2).  Intake/Output Summary (Last 24 hours) at 07/19/14 1030 Last data filed at 07/19/14 0756  Gross per 24 hour  Intake   1004 ml  Output   1450 ml  Net   -446 ml     Sclerae unicteric  Oropharynx clear  Lungs  Scattered wheezes  Heart regular rate and rhythm  Abdomen benign  MSK  no peripheral edema, atrophy  Neuro, weakness in lower extremities bilaterally    CBG (last 3)  No results found for this basename: GLUCAP,  in the last 72 hours   Labs:  Lab Results  Component Value Date   WBC 11.2* 07/17/2014   HGB 12.1* 07/17/2014   HCT 35.7* 07/17/2014   MCV 88.4 07/17/2014   PLT 297 07/17/2014   NEUTROABS 6.1 0/62/3762    Basic Metabolic Panel:  Recent Labs Lab 07/13/14 0342 07/16/14 0003 07/17/14 0458  NA 137 138 137  K 3.9 4.4 4.2  CL 96 100 98  CO2 28 28 27   GLUCOSE 96 104* 142*  BUN 15 22 27*  CREATININE 0.77 0.83 0.73  CALCIUM 9.0 8.8 8.9   GFR Estimated Creatinine Clearance: 93.3 ml/min (by C-G formula based on Cr of 0.73). Liver Function Tests:  Recent Labs Lab 07/13/14 0342  AST 20  ALT 11  ALKPHOS 127*  BILITOT 0.4  PROT 6.8  ALBUMIN 3.4*   No results found for this basename: LIPASE, AMYLASE,  in the last 168 hours No results found for this basename: AMMONIA,  in the last 168 hours Coagulation profile  Recent Labs Lab 07/14/14 0829  INR 1.02    CBC:  Recent Labs Lab 07/13/14 0342 07/16/14 0003 07/17/14 0458  WBC 9.1 8.0 11.2*  HGB 12.7* 11.5* 12.1*  HCT 37.5* 34.3* 35.7*  MCV 89.1 88.4 88.4  PLT 326 289 297   Cardiac Enzymes: No results found for this basename: CKTOTAL, CKMB, CKMBINDEX, TROPONINI,  in the last 168 hours BNP: No components found  with this basename: POCBNP,  CBG: No results found for this basename: GLUCAP,  in the last 168 hours D-Dimer No results found for this basename: DDIMER,  in the last 72 hours Hgb A1c No results found for this basename: HGBA1C,  in the last 72 hours Lipid Profile No results found for this basename: CHOL, HDL, LDLCALC, TRIG, CHOLHDL, LDLDIRECT,  in the last 72 hours Thyroid function studies No results found for this basename: TSH, T4TOTAL, FREET3, T3FREE, THYROIDAB,  in the last 72 hours Anemia work up No results found for this basename: VITAMINB12, FOLATE, FERRITIN, TIBC, IRON, RETICCTPCT,  in the last 72 hours Microbiology No results found for this or any previous visit (from the past 240 hour(s)).  PATHOLOGY: Diagnosis Soft Tissue Needle Core Biopsy - POORLY DIFFERENTIATED CARCINOMA, SEE COMMENT. Microscopic Comment The carcinoma demonstrates the following immunophenotype: Cytokeratin AE1/3 - strong diffuse expression. CDX2 - negative expression. TTF-1 - negative expression. PSA - negative expression. Chromogranin - negative expression. CD56 - negative expression. Melan-A - negative expression. S-100 - negative expression. Cytokeratin 5/6 - negative expression. Cytokeratin 7 - strong diffuse expression. Cytokeratin 20 - negative expression. Cytokeratin 903 - patchy moderate to  strong expression. Hep Par 1 - focal moderate to strong expression. Inhibin - negative expression. Napsin A - negative expression. p63 - negative expression. Mucicarmine - focal moderate to strong expression. Overall the morphology and immunophenotype are that of nonspecific poorly differentiated carcinoma with focal areas of adenocarcinomatous differentiation. Although the immunophenotype is nonspecific, the immunophenotype does not support melanocytic differentiation, prostatic primary, lung primary, neuroendocrine differentiation, lower gastrointestinal differentiation, adrenocortical origin or  hepatocellular origin. The case was reviewed with Dr Nicholas Soto who essentially concurs. (CRR:ecj 07/18/2014) Nicholas RUND DO Pathologist, Electronic Signature (Case signed 07/18/2014)  Studies:  No results found.  Assessment: 63 y.o.    Plan:  1. 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  We also discussed case with Dr. Donato Soto of pathology.  Given carcinoma with some areas of adenocarcinoma, molecular profiling is recommended.  We will require an additional biopsy as it will inform treatment options for this patient.   CT guided biopsy recommended to most accessible site.   Kappa/lamda ratio is negative, MM less likely.   2. Intractable right groin, back pain secondary to # 1.  --Continue radiation per Radiation oncology.  --Continue Decadron 4 mg every 6 hours.   3. 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  --No vision changes.  One may consider mRI of the brain if brain mets are suspected, pending of formal diagnosis to complete staging.   4. Anemia, mild. --He is asymptomatic.   5. Protein calorie malnutrition, severe. --Secondary to #1. Ensure supplementation.  Albumin 3.4.  6. ECOG 2.  7. Full Code  Palliative Care evaluation ongoing to help with symptoms  We will follow this patient with you.    Nicholas Sardo, MD 07/19/2014  10:30 AM

## 2014-07-19 NOTE — Progress Notes (Signed)
Physical Therapy Treatment Patient Details Name: Nicholas Soto MRN: [id number] DOB: Nov 26, 1951 Today's Date: 07/19/2014    History of Present Illness pt admitted 07/13/14 patient had severe pain which limited ambulation, for which he was brought to [facility] by EMS.  MRI of the lumbar spine on 07/13/14 showed widespread bony metastatic disease includin L3 vertebral body on the right side leading to epidural tumor and tumor in the right neural foramen at L2-3. There was also involvement of the S1 and S2 neural foramina by tumor and sacral epidural tumor (S2 was on the left side). CT abdomen/ pelvis showed widespread disease in multiple lymph node regions and diffuse lytic bone mets.    PT Comments    Pt sitting EOB eating lunch on arrival.  Slow/sluggish but functional.  Pt did agree to try so assisted with standing then amb in hallway twice with one sitting rest break due to MAX c/o R LE pain (from posterior hip to knee) in which R knee buckles.  Chair following close behind for safety.  Sitting rest break, more pain meds were requested.  Amb back to his room then assisted back to bed.    Follow Up Recommendations  CIR;Supervision/Assistance - 24 hour     Equipment Recommendations  Rolling walker with 5" wheels    Recommendations for Other Services       Precautions / Restrictions Precautions Precautions: Fall Precaution Comments: R knee will buckle, increased pain with mobility Restrictions Weight Bearing Restrictions: No    Mobility  Bed Mobility Overal bed mobility: Needs Assistance Bed Mobility: Supine to Sit;Sit to Supine     Supine to sit: Min guard Sit to supine: Min assist      Transfers Overall transfer level: Needs assistance Equipment used: Rolling walker (2 wheeled) Transfers: Sit to/from Stand Sit to Stand: Mod assist;Max assist         General transfer comment: 50% VC's on proper tech and hand placement  Ambulation/Gait Ambulation/Gait assistance: Mod  assist Ambulation Distance (Feet): 90 Feet (45 feet x 2 one sitting rest break) Assistive device: Rolling walker (2 wheeled) Gait Pattern/deviations: Step-to pattern;Step-through pattern;Narrow base of support;Trunk flexed Gait velocity: decreased   General Gait Details: cues for safety , increased support due to increased pain of R leg.  Chair following behind for safety as R knee will buckle due to pain/fatigue.   Stairs            Wheelchair Mobility    Modified Rankin (Stroke Patients Only)       Balance                                    Cognition                            Exercises      General Comments        Pertinent Vitals/Pain C/o 20/10 pain during gait "burning from my hip to my knee" R LE PCA x 2 More IV pain meds requested Assisted back to bed    Home Living                      Prior Function            PT Goals (current goals can now be found in the care plan section) Progress towards PT goals: Progressing toward goals  Frequency       PT Plan      Co-evaluation             End of Session Equipment Utilized During Treatment: Gait belt Activity Tolerance: Patient limited by pain Patient left: in bed;with call bell/phone within reach;with bed alarm set     Time: 1421-1452 PT Time Calculation (min): 31 min  Charges:  $Gait Training: 8-22 mins $Therapeutic Activity: 8-22 mins                    G Codes:      Rica Koyanagi  PTA [facility]  Acute  Rehab Pager      [phone]

## 2014-07-19 NOTE — Progress Notes (Signed)
Clinical Social Work Department BRIEF PSYCHOSOCIAL ASSESSMENT 07/19/2014  Patient:  Nicholas Soto, Nicholas Soto     Account Number:  [id number]     Admit date:  07/13/2014  Clinical Social Worker:  Ulyess Blossom  Date/Time:  07/19/2014 10:30 AM  Referred by:  Physician  Date Referred:  07/19/2014 Referred for  SNF Placement   Other Referral:   CIR vs. SNF   Interview type:  Patient Other interview type:   and patient wife at bedside    PSYCHOSOCIAL DATA Living Status:  WIFE Admitted from facility:   Level of care:   Primary support name:  Nicholas Soto/wife/[phone] Primary support relationship to patient:  SPOUSE Degree of support available:   adequate    CURRENT CONCERNS Current Concerns  Post-Acute Placement   Other Concerns:    SOCIAL WORK ASSESSMENT / PLAN [name] received referral that disposition plan recommendation for CIR vs. SNF.    Attempts were made yesterday to meet with pt, but pt pain was too extensive at the time and pt unable to participate in assessment.    Per RN, pt still in pain this morning, but better managed at this time than yesterday. [name] visited pt room and pt wife present at bedside. [name] introduced self and explained role. Pt reports that he lives with his wife whom he has been married to for over 8 years. Pt wife discussed that she cares for pt and pt wife grandson and is home a lot, but also trying to assist with caring for grandchildren. [name] discussed that pt will likely need rehab following hospitalization. Pt wife stated that pt and pt wife have been told that [place] may be an options. [name] explained that [facility] is being explored, but currently pt not appropriate for [facility] as pt pain make him not able to tolerate acute inpatient rehab therapies. [name] discussed that as pt progress that [facility] can be readdressed, but if pt remains unable to tolerate extensive rehab then other option would be  rehab at a [facility]. [name] clarified pt and pt wife questions regarding Skilled Nursing Facilities for short term rehab. Pt and pt wife discussed that they are open to exploring options, but very hopeful that radiation will continue to help pt pain and that pt pain will begin to be more manageable in order for pt to fully participate in physical therapy and be appropriate for CIR.    Pt and pt wife agreeable to [name] returning on Monday in order to see how pain has progressed in order to assist with disposition options.    Pt and pt wife appreciative of [name] visit.    [name] discussed with MD who feels that it is appropriate to follow up on Monday as MD is hopeful that pt will begin to have some pain relief from radiation and have pain better managed. Per MD, hopeful that pt will be able to transition off of PCA pump for discharge, but pt has not yet been to a point with his pain to transition of PCA pump.    [name] to continue to follow to provide support and assist with disposition needs.   Assessment/plan status:  Psychosocial Support/Ongoing Assessment of Needs Other assessment/ plan:   discharge planning   Information/referral to community resources:   CIR consult was placed by MD, SNF referrals pending pt improvement.    PATIENT'S/FAMILY'S RESPONSE TO PLAN OF CARE: Pt alert and oriented x 4. Pt expressed that he remains in  pain, but pain was better managed today compared to yesterday when [name] had visited. Pt and pt wife recognize that pt will need rehab following hospitalization, but are hopeful and would prefer [facility].    [name], MSW, [place] Work [phone]

## 2014-07-19 NOTE — Progress Notes (Signed)
  Subjective: Pt s/p CT guided right RP mass biopsy on 07/15/14 which revealed poorly differentiated carcinoma; request now received for repeat biopsy of RP mass for additional tissue for molecular profiling. Pt still with rt flank to rt groin pain. See original consult note on 7/19 for additional details.  Objective: Vital signs in last 24 hours: Temp:  [97.8 F (36.6 C)-98.1 F (36.7 C)] 98.1 F (36.7 C) (07/24 0508) Pulse Rate:  [81-88] 81 (07/24 0508) Resp:  [11-20] 15 (07/24 1131) BP: (108-137)/(54-58) 137/58 mmHg (07/24 0508) SpO2:  [93 %-96 %] 95 % (07/24 1131) Last BM Date: 07/17/14  Intake/Output from previous day: 07/23 0701 - 07/24 0700 In: 1004 [P.O.:960; I.V.:44] Out: 1625 [Urine:1625] Intake/Output this shift: Total I/O In: 360 [P.O.:360] Out: 300 [Urine:300]  Pt awake/alert; chest- few scatt wheezes; heart- RRR; abd- soft,+BS, tender rt flank region; ext- no edema  Lab Results:   Recent Labs  07/17/14 0458  WBC 11.2*  HGB 12.1*  HCT 35.7*  PLT 297   BMET  Recent Labs  07/17/14 0458  NA 137  K 4.2  CL 98  CO2 27  GLUCOSE 142*  BUN 27*  CREATININE 0.73  CALCIUM 8.9   PT/INR No results found for this basename: LABPROT, INR,  in the last 72 hours ABG No results found for this basename: PHART, PCO2, PO2, HCO3,  in the last 72 hours  Studies/Results: No results found.  Anti-infectives: Anti-infectives   None      Assessment/Plan: s/p CT guided right RP mass bx 7/20 yielding poorly diff carcinoma; request now received from oncology for additional tissue for molecular profiling. Plan is for CT guided repeat right RP mass bx on 7/27. Details/risks of procedure d/w pt with his understanding and consent.  LOS: 6 days    Alycea Segoviano,D [facility] 07/19/2014

## 2014-07-19 NOTE — Progress Notes (Addendum)
Patient ID: Nicholas Soto, male   DOB: [birth year], 63 y.o.   MRN: [id number] [facility] PROGRESS NOTE  Nicholas Soto DQQ:[id number] DOB: July 05, 1951 DOA: 07/13/2014 PCP: [name], MD  Brief narrative: 63 y.o. 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 Specifically, MRI of the lumbar spine on 07/13/14 showed widespread bony metastatic disease including L3 vertebral body on the right side leading to epidural tumor and tumor in the right neural foramen at L2-3. There was also involvement of the S1 and S2 neural foramina by tumor and sacral epidural tumor (S2 was on the left side). CT abdomen/ pelvis showed widespread disease in multiple lymph node regions and diffuse lytic bone mets. Radiation oncology was consulted due to patient's complaints of severe pain in right groin area. He started RT 7/21 with pain improving in right groin area. 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   Assessment and Plan:   Principal Problem:  Intractable right groin pain, back pain secondary to lumbar spine tumor / bone metastasis / possible multiple myeloma   No evidence of cord compression. No neurological complaints. Pt did have numbness in right groin area on 7/21. Radiation oncology consulted and pt started RT 7/21 to this area. Pt feels pain relief in groin area but still has lot of pain in his back and shoulders. Left and right shoulder X rays did not show acute findings of fracture or metastatic disease.  Appreciate palliative care consisting pain  management. Current pain management is with Dilaudid PCA, OxyContin 80 mg by mouth 3 times a day, Dilaudid 2 mg IV every 2 hours as needed. Adjuvant therapy is with Lyrica 75 mg by mouth twice a day, ativan 1 mg PO BID  Appreciate Dr. [name] of oncology following. So far,/lambda light chains are unremarkable. UPEP and SPEP are pending.  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 Per oncology recommendations we will need another CT-guided tissue biopsy for molecular profiling. Order placed.  Continue Decadron 4 mg by mouth every 6 hours.   Active Problems:  TOBACCO ABUSE   Nicotine patch ordered. Tobacco cessation counseling per nursing staff. Anemia of chronic disease   Likely due to malignancy   No signs of active bleeding   Hemoglobin is12.1 Chronic obstructive pulmonary disease   Continue Symbicort, Spiriva and xopenex when necessary.   Continue Singulair 10 mg daily.  Anxiety and depression  Continue Ativan 1 mg by mouth twice a day, Paxil 10 mg in the morning. Unspecified hereditary and idiopathic peripheral neuropathy   Burning pain in legs improved. Continue lyrica 75 mg PO BID. Functional quadriplegia  Limited mobility secondary to severe pain from what seems to be bone metastases.  Patient was evaluated by inpatient rehabilitation and their recommendation was for skilled nursing facility placement. I think it's reasonable to see if patient improves with radiation therapy and hopefully we will be able to treat his pain so he would be able to participate in physical therapy here and then transitioned to inpatient rehab. Loss of weight /  severe protein calorie malnutrition   Cancer related cachexia. Dietitian consulted. Continue ensure supplementation.  DVT prophylaxis   Lovenox ordered while inpatient. No signs of bleeding.    Code Status:  Full  Family Communication: Pt and wife at bedside  Disposition Plan: remains inpatient but once more stable and able to participate in physical therapy may likely go to inpatient rehab or SNF.   Consultants:   Interventional radiology for CT guided biopsy   Radiation oncology (Dr. Thea Silversmith)   Oncology (Dr. Concha Norway) Other consultants:  Dietitian  Inpatient rehabilitation  Physical therapy  Social work Procedures/Studies:   MRI lumbar spine 07/13/14:1. Unfortunately there are enhancing tumors in most of the lumbar vertebra, the sacrum, paraspinal tissues, subcutaneous tissues, retroperitoneum, and perirenal spaces most compatible with a diffuse metastatic process. Oncology referral with staging workup to determine a suitable biopsy site recommended. 2. Cortical breakthrough from the L3 vertebral body on the right side leads to epidural tumor and tumor in the right neural foramen at L2-3. There is also involvement of the S1 and S2 neural foramina by tumor and sacral epidural tumor as noted above. 3. Spondylosis and degenerative disc disease also cause moderate impingement at L3-4 and mild impingement at T11-12 and L4-5, as noted above.   CT of the chest, abdomen and pelvis 07/13/14: Bulky mediastinal lymphadenopathy with mild lymphadenopathy also seen in the right axilla, bilateral pericardial spaces, and retrocrural regions, consistent metastatic disease. Diffuse body wall soft tissue metastases throughout the chest, abdomen, and pelvis. Diffuse mesenteric and retroperitoneal metastases throughout the abdomen and pelvis. Diffuse lytic bone metastases. Probable small splenic metastases.   CT Biopsy 07/15/2014 CT-guided core biopsy of right retroperitoneal soft tissue mass.  Antibiotics:  None    [name], MD  [facility] Pager [phone]  If 7PM-7AM, please contact night-coverage [email or web address] Password [facility] 07/19/2014, 11:15 AM   LOS: 6 days     HPI/Subjective: No acute overnight events. Pain 7/10 this am.  Objective: Filed Vitals:   07/18/14 2324 07/19/14 0400 07/19/14 0508 07/19/14 0834  BP:   137/58   Pulse:   81   Temp:   98.1 F (36.7 C)   TempSrc:   Oral   Resp: 11 13 12 15   Height:      Weight:      SpO2: 93% 96% 94% 96%    Intake/Output Summary (Last 24 hours) at 07/19/14 1115 Last data filed at 07/19/14 0900  Gross per 24 hour  Intake   1124 ml  Output   1450 ml  Net   -326 ml    Exam:   General:  Pt is alert, follows commands appropriately, not in acute distress  Cardiovascular: Regular rate and rhythm, S1/S2, no murmurs  Respiratory: gurgling sounds appreciate, mild wheezing in upper lung lobes.   Abdomen: Soft, non tender, non distended, bowel sounds present  Extremities: No edema, pulses DP and PT palpable bilaterally  Neuro: Grossly nonfocal  Data Reviewed: Basic Metabolic Panel:  Recent Labs Lab 07/13/14 0342 07/16/14 0003 07/17/14 0458  NA 137 138 137  K 3.9 4.4 4.2  CL 96 100 98  CO2 28 28 27   GLUCOSE 96 104* 142*  BUN 15 22 27*  CREATININE 0.77 0.83 0.73  CALCIUM 9.0 8.8 8.9   Liver Function Tests:  Recent Labs Lab 07/13/14 0342  AST 20  ALT 11  ALKPHOS 127*  BILITOT 0.4  PROT 6.8  ALBUMIN 3.4*   No results found for this basename: LIPASE, AMYLASE,  in the last 168 hours No results found for this basename: AMMONIA,  in the last 168 hours CBC:  Recent Labs Lab 07/13/14 0342 07/16/14 0003 07/17/14 0458  WBC 9.1 8.0 11.2*  HGB 12.7* 11.5* 12.1*  HCT 37.5* 34.3* 35.7*  MCV 89.1 88.4 88.4  PLT 326 289 297   Cardiac Enzymes: No results found for this basename: CKTOTAL, CKMB, CKMBINDEX, TROPONINI,  in the last 168 hours BNP: No components found with this basename: POCBNP,  CBG: No results found for this basename: GLUCAP,  in the last 168 hours  No results found for this or any previous visit (from the past 240 hour(s)).   Studies: No results  found.  Scheduled Meds: . budesonide-formoterol  2 puff Inhalation QHS  . dexamethasone  4 mg Oral 4 times per day  . enoxaparin (LOVENOX)   40 mg Subcutaneous Q24H  . feeding supplement   237 mL Oral TID BM  . HYDROmorphone PCA 0.3 mg/mL   Intravenous 6 times per day  . LORazepam  1 mg Oral BID  . mirtazapine  15 mg Oral QHS  . montelukast  10 mg Oral q morning - 10a  . nicotine  21 mg Transdermal Daily  . OxyCODONE  80 mg Oral TID  . pantoprazole  40 mg Oral q morning - 10a  . PARoxetine  10 mg Oral q morning - 10a  . pregabalin  75 mg Oral BID  . senna  1 tablet Oral BID  . tiotropium  18 mcg Inhalation Daily

## 2014-07-19 NOTE — Progress Notes (Signed)
NUTRITION FOLLOW UP  Intervention:   -Staff to continue to encourage PO intake  -Continue to recommend Ensure Complete; modify as needed to increase pt's acceptability -Continue with Remeron for appetite stimulation -RD to follow  Nutrition Dx:   Inadequate oral intake related to nausea and vomiting as evidenced by reported intake less than estimated needs and wt loss-ongoing    Goal:   Pt to meet >/= 90% of their estimated nutrition needs - not met   Monitor:   Total protein/energy intake, labs, weights, supplement acceptance  Assessment: :  7/19: - Pt reports that he has experienced nausea and vomiting for the past several months which led to weight loss of about 25-30 lbs.  - Pt with no obvious signs of fat and muscle wasting, but pt reports that he feels that most of his muscle is gone.  - Reported that he had a good breakfast this am   7/24: -Pt being transported to receive radiation treatment during time of RD follow up -Pt discussed during multidisciplinary rounds. MD currently working on pain management, which will likely assist in improving appetite once appropriate regimen in determine -RN reported pt with decreased appetite, consuming <50% of meal; staff continues to encourage PO intake.  -Pt noted to dislike aftertaste of Ensure supplement; however will drink if some ice cream is added. RN and NT to continue with this practice to promote nutrient replenishment -Started Remeron on 7/22. Continue for appetite stimulation  Height: Ht Readings from Last 1 Encounters:  07/13/14 _0  (1.753 m)    Weight Status:   Wt Readings from Last 1 Encounters:  07/13/14 164 lb 9.6 oz (74.662 kg)    Re-estimated needs:  Kcal: 2000-2200  Protein: 100-110 g  Fluid: ~2.0 L/day    Skin: WDL  Diet Order: NPO   Intake/Output Summary (Last 24 hours) at 07/19/14 1213 Last data filed at 07/19/14 0900  Gross per 24 hour  Intake   1124 ml  Output   1450 ml  Net   -326 ml     Last BM: 7/22   Labs:   Recent Labs Lab 07/13/14 0342 07/16/14 0003 07/17/14 0458  NA 137 138 137  K 3.9 4.4 4.2  CL 96 100 98  CO2 _1 BUN 15 22 27*  CREATININE 0.77 0.83 0.73  CALCIUM 9.0 8.8 8.9  GLUCOSE 96 104* 142*    CBG (last 3)  No results found for this basename: GLUCAP,  in the last 72 hours  Scheduled Meds: . antiseptic oral rinse  15 mL Mouth Rinse BID  . budesonide-formoterol  2 puff Inhalation QHS  . dexamethasone  4 mg Oral 4 times per day  . enoxaparin (LOVENOX) injection  40 mg Subcutaneous Q24H  . feeding supplement (ENSURE COMPLETE)  237 mL Oral TID BM  . HYDROmorphone PCA 0.3 mg/mL   Intravenous 6 times per day  . LORazepam  1 mg Oral BID  . mirtazapine  15 mg Oral QHS  . montelukast  10 mg Oral q morning - 10a  . nicotine  21 mg Transdermal Daily  . OxyCODONE  80 mg Oral TID  . pantoprazole  40 mg Oral q morning - 10a  . PARoxetine  10 mg Oral q morning - 10a  . pregabalin  75 mg Oral BID  . senna  1 tablet Oral BID  . tiotropium  18 mcg Inhalation Daily    Continuous Infusions:   Atlee Abide MS RD LDN Clinical  Dietitian [phone]

## 2014-07-20 DIAGNOSIS — J9601 Acute respiratory failure with hypoxia: Secondary | ICD-10-CM | POA: Diagnosis present

## 2014-07-20 LAB — CREATININE, SERUM
Creatinine, Ser: 0.76 mg/dL (ref 0.50–1.35)
GFR calc Af Amer: >90 mL/min (ref >90)
GFR calc non Af Amer: >90 mL/min (ref >90)

## 2014-07-20 MED ORDER — HYDROMORPHONE HCL PF 2 MG/ML IJ SOLN
2.0000 mg | INTRAMUSCULAR | Status: DC | PRN
  Administered 2014-07-20 – 2014-07-21 (×4): 2 mg via INTRAVENOUS
  Filled 2014-07-20 (×5): qty 1

## 2014-07-20 MED ORDER — HYDROMORPHONE 0.3 MG/ML IV SOLN
INTRAVENOUS | Status: DC
  Administered 2014-07-20: 5.14 mg via INTRAVENOUS
  Administered 2014-07-20 (×3): via INTRAVENOUS
  Administered 2014-07-20: 4.37 mg via INTRAVENOUS
  Administered 2014-07-20: 6.94 mg via INTRAVENOUS
  Administered 2014-07-21 (×2): via INTRAVENOUS
  Administered 2014-07-21: 9.21 mg via INTRAVENOUS
  Administered 2014-07-21: 10.31 mg via INTRAVENOUS
  Filled 2014-07-20 (×5): qty 25

## 2014-07-20 MED ORDER — SCOPOLAMINE 1 MG/3DAYS TD PT72
1.0000 | MEDICATED_PATCH | TRANSDERMAL | Status: DC
  Administered 2014-07-20 – 2014-07-23 (×2): 1.5 mg via TRANSDERMAL
  Filled 2014-07-20 (×2): qty 1

## 2014-07-20 NOTE — Progress Notes (Addendum)
Patient ID: Nicholas Soto, male   DOB: 08-20-1951, 63 y.o.   MRN: [id number] [facility] PROGRESS NOTE  Nicholas Soto WNU:[id number] DOB: 03-21-1951 DOA: 07/13/2014 PCP: [name], MD  Brief narrative: 63 y.o. 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 Pt was found to have diffuse metastatic disease to his spine upon initial evaluation. Specifically, MRI of the lumbar spine on 07/13/14 showed widespread bony metastatic disease including L3 vertebral body on the right side leading to epidural tumor and tumor in the right neural foramen at L2-3. There was also involvement of the S1 and S2 neural foramina by tumor and sacral epidural tumor (S2 is on the left side). CT abdomen/ pelvis showed widespread disease in multiple lymph node regions and diffuse lytic bone mets. Radiation oncology was consulted due to patient's complaints of severe pain in right groin area. He started RT 07/16/14 with initial improvement in pain however over past 1-2 days his pain is largely uncontrolled. 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 No neurological complaints. Pt did have numbness in right groin area on 7/21 but this has improved after starting radiation therapy. Patient started radiation therapy 07/16/2014 with initial improvement in pain but over past day or so his pain has largely been uncontrolled especially in the area of right groin. Appreciate  palliative care assistance with pain management.  In regards to shoulder pain patient had X rays done of both shoulders with no evidence of disease in those areas. Continue efforts to improve pain with dilaudid PCA,  Dilaudid 2 mg IV every 1 hour for breakthrough pain.  Adjuvant therapy is with Lyrica 75 mg by mouth twice a day, ativan 1 mg PO BID  Appreciate Dr. [name] of oncology following. So far kappa/lambda light chains are unremarkable. UPEP and SPEP are pending.  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 Per oncology recommendations we need another CT-guided tissue biopsy for molecular profiling. Order placed.  Continue Decadron 4 mg by mouth every 6 hours.  Active Problems:  TOBACCO ABUSE  Nicotine patch ordered. Tobacco cessation counseling per nursing staff. Anemia of chronic disease  Likely due to malignancy  No signs of active bleeding  Hemoglobin is12.1 We will check CBC in am. Chronic obstructive pulmonary disease / Acute respiratory failure with hypoxia Continue Symbicort, Spiriva and xopenex when necessary.  Due to increased gurgling sounds scopolamine patch was ordered. Respiratory status is stable. Continue Singulair 10 mg daily.  Anxiety and depression  Continue Ativan 1 mg by mouth twice a day, Paxil 10 mg in the morning. Unspecified hereditary and idiopathic peripheral neuropathy  Burning pain in legs improved. Continue lyrica 75 mg PO BID. Functional quadriplegia  Limited mobility secondary to severe pain from what seems to be bone metastases.  Patient was evaluated by inpatient rehabilitation and their recommendation was for skilled nursing facility placement. I think it's reasonable to see if patient improves  with radiation therapy and hopefully we will be able to treat his pain so he would be able to participate in physical therapy here  and then transitioned to inpatient rehab. Loss of weight / severe protein calorie malnutrition  Cancer related cachexia. Dietitian consulted. Continue ensure supplementation.  DVT prophylaxis  Lovenox ordered while inpatient. No signs of bleeding.    Code Status: Full  Family Communication: Pt and wife at bedside  Disposition Plan: remains inpatient    Consultants:  Interventional radiology for CT guided biopsy  Radiation oncology (Dr. Thea Silversmith)  Oncology (Dr. Concha Norway) Palliative care team (Dr. Deitra Mayo) Other consultants:  Dietitian  Inpatient rehabilitation  Physical therapy  Social work Procedures/Studies:  MRI lumbar spine 07/13/14:1. Unfortunately there are enhancing tumors in most of the lumbar vertebra, the sacrum, paraspinal tissues, subcutaneous tissues, retroperitoneum, and perirenal spaces most compatible with a diffuse metastatic process. Oncology referral with staging workup to determine a suitable biopsy site recommended. 2. Cortical breakthrough from the L3 vertebral body on the right side leads to epidural tumor and tumor in the right neural foramen at L2-3. There is also involvement of the S1 and S2 neural foramina by tumor and sacral epidural tumor as noted above. 3. Spondylosis and degenerative disc disease also cause moderate impingement at L3-4 and mild impingement at T11-12 and L4-5, as noted above.  CT of the chest, abdomen and pelvis 07/13/14: Bulky mediastinal lymphadenopathy with mild lymphadenopathy also seen in the right axilla, bilateral pericardial spaces, and retrocrural regions, consistent metastatic disease. Diffuse body wall soft tissue metastases throughout the chest, abdomen, and pelvis. Diffuse mesenteric and retroperitoneal metastases throughout the abdomen and pelvis. Diffuse lytic bone metastases. Probable small splenic metastases.  CT Biopsy 07/15/2014 CT-guided core biopsy of right retroperitoneal soft tissue mass.  Antibiotics:  None     [name], MD  [facility] Pager [phone]  If 7PM-7AM, please contact night-coverage [email or web address] Password [email or web address] 07/20/2014, 11:10 AM   LOS: 7 days    HPI/Subjective: No acute overnight events.  Objective: Filed Vitals:   07/20/14 0508 07/20/14 0724 07/20/14 0933 07/20/14 0947  BP:      Pulse:      Temp:      TempSrc:      Resp: 11 12  14   Height:      Weight:      SpO2: 100% 93% 93% 94%    Intake/Output Summary (Last 24 hours) at 07/20/14 1110 Last data filed at 07/20/14 0800  Gross per 24 hour  Intake  766.7 ml  Output   2350 ml  Net -1583.3 ml    Exam:   General:  Pt is alert, not in acute distress  Cardiovascular: Regular rate and rhythm, S1/S2, no murmurs  Respiratory: gurgling sounds appreciated, mild wheezing in upper lung lobes  Abdomen: Soft, non tender, non distended, bowel sounds present  Extremities: No edema, pulses DP and PT palpable bilaterally  Neuro: Grossly nonfocal  Data Reviewed: Basic Metabolic Panel:  Recent Labs Lab 07/16/14 0003 07/17/14 0458 07/20/14 0349  NA 138 137  --   K 4.4 4.2  --   CL 100 98  --   CO2 28 27  --   GLUCOSE 104* 142*  --   BUN 22 27*  --   CREATININE 0.83 0.73 0.76  CALCIUM 8.8 8.9  --    Liver Function Tests: No results found for this basename: AST, ALT, ALKPHOS, BILITOT, PROT, ALBUMIN,  in the last 168 hours No results found  for this basename: LIPASE, AMYLASE,  in the last 168 hours No results found for this basename: AMMONIA,  in the last 168 hours CBC:  Recent Labs Lab 07/16/14 0003 07/17/14 0458  WBC 8.0 11.2*  HGB 11.5* 12.1*  HCT 34.3* 35.7*  MCV 88.4 88.4  PLT 289 297   Cardiac Enzymes: No results found for this basename: CKTOTAL, CKMB, CKMBINDEX, TROPONINI,  in the last 168 hours BNP: No components found with this basename: POCBNP,  CBG: No results found for this basename: GLUCAP,  in the last 168 hours  No results found for this or any previous visit (from  the past 240 hour(s)).   Studies: No results found.  Scheduled Meds: . antiseptic oral rinse  15 mL Mouth Rinse BID  . budesonide-formoterol  2 puff Inhalation QHS  . dexamethasone  4 mg Oral 4 times per day  . enoxaparin (LOVENOX) injection  40 mg Subcutaneous Q24H  . feeding supplement (ENSURE COMPLETE)  237 mL Oral TID BM  . HYDROmorphone PCA 0.3 mg/mL   Intravenous 6 times per day  . LORazepam  1 mg Oral BID  . mirtazapine  15 mg Oral QHS  . montelukast  10 mg Oral q morning - 10a  . nicotine  21 mg Transdermal Daily  . pantoprazole  40 mg Oral q morning - 10a  . PARoxetine  10 mg Oral q morning - 10a  . pregabalin  75 mg Oral BID  . senna  1 tablet Oral BID  . tiotropium  18 mcg Inhalation Daily

## 2014-07-20 NOTE — Progress Notes (Addendum)
Patient Nicholas Soto      DOB: January 10, 1951      SAY:[id number]   Palliative Medicine Team at [facility] Progress Note    Subjective: Had been doing much better over past few days, but woke up with severe pain this morning.  Had been sleeping and not pushing PCA button.  Difficult for him to get caught up with pain on PCA.  Pain so severe this morning he ahs hard time talking with [place].  Reports that his pain is all in right thigh/leg/back area.      Filed Vitals:   07/20/14 0724  BP:   Pulse:   Temp:   Resp: 12   Physical exam: Gen: Grimacing in severe pain, tearful CV: RRR  Lungs: coarse breath sounds bilaterally ABD: soft, ND  EXT: no c/c/e  SKIN: warm/dry  NEURO: moves bilat lower ext equally.     Assessment and plan: 63 yo male with chronic back/leg pain, , HTN, COPD, remote h.o cancer in left eye who presented with severe back pain and found to have likely widely metastatic carcinoma.   1. Code Status: Full   2. Illness Understanding/GOC- See initial consult note. Path with poorly differentiated adenocarcinoma. Plan for repeat Bx on Monday  3. Symptom Mangement  A. Cancer related pain- Had been making progress but pain significantly worse this morning. Exacerbated by sleeping (not pushing PCA button) and waking up with severe pain. Given difficulty in controlling pain, I will d/c oxycontin and start basal infusion on PCA.  Used several 2mg  boluses of IV dilaudid yesterday as well.  With PCA can titrate more rapidly and hopefully have better idea of what his final pain med requirements will be. oxycontin 240mg /day is equal to hydromorphone 18mg /day or 0.8mg /hr. Will not decrease for incomplete cross tolerance with severe pain and prior dilaudid exposure.  - Continue adjuvant steroids, lyrica.  would avoid bisphos with his poor dentition - D/C oxycontin and change dilaudid PCA to 0.8mg /hr continuous infusion and bolus of 0.8mg  q21min PRN (Has ET CO2 - Please contact  me if further adjustments needed B. Wt Loss- Started Remeron. monitor appetite  C. Insomnia-remeron. Slept well, but severe pain in AM.  D. Anxiety/Depression- on ativan/paxil at home. Started remeron as above. Monitor   4. Psychosocial/Spiritual: Married for >40 years to his wife Coralyn Mark. They have 3 biological children and 2 adopted children. Most family lives near [place] area. Retired from [employer].     Time In: 905 Time Out: 930 Total Time 25 min  >50% of time spent in counseling and cordination of care as above. Discussed case with bedside nurse and Dr Charlies Silvers.   Doran Clay D.O. Palliative Medicine Team at [facility]  Pager: [phone] Team Phone: [phone]

## 2014-07-21 DIAGNOSIS — J9600 Acute respiratory failure, unspecified whether with hypoxia or hypercapnia: Secondary | ICD-10-CM

## 2014-07-21 LAB — BASIC METABOLIC PANEL
Anion gap: 11 (ref 5–15)
BUN: 25 mg/dL — ABNORMAL HIGH (ref 6–23)
CO2: 31 meq/L (ref 19–32)
CREATININE: 0.64 mg/dL (ref 0.50–1.35)
Calcium: 8.9 mg/dL (ref 8.4–10.5)
Chloride: 95 mEq/L — ABNORMAL LOW (ref 96–112)
GFR calc Af Amer: >90 mL/min (ref >90)
GFR calc non Af Amer: >90 mL/min (ref >90)
Glucose, Bld: 139 mg/dL — ABNORMAL HIGH (ref 70–99)
Potassium: 4.6 mEq/L (ref 3.7–5.3)
Sodium: 137 mEq/L (ref 137–147)

## 2014-07-21 LAB — CBC
HCT: 35.7 % — ABNORMAL LOW (ref 39.0–52.0)
HEMOGLOBIN: 11.6 g/dL — AB (ref 13.0–17.0)
MCH: 29.5 pg (ref 26.0–34.0)
MCHC: 32.5 g/dL (ref 30.0–36.0)
MCV: 90.8 fL (ref 78.0–100.0)
Platelets: 273 10*3/uL (ref 150–400)
RBC: 3.93 MIL/uL — ABNORMAL LOW (ref 4.22–5.81)
RDW: 13.8 % (ref 11.5–15.5)
WBC: 12.8 10*3/uL — AB (ref 4.0–10.5)

## 2014-07-21 MED ORDER — HYDROMORPHONE 0.3 MG/ML IV SOLN
INTRAVENOUS | Status: DC
  Administered 2014-07-21 (×4): via INTRAVENOUS
  Administered 2014-07-22: 8.88 mg via INTRAVENOUS
  Administered 2014-07-22: 08:00:00 via INTRAVENOUS
  Administered 2014-07-22: 7.5 mg via INTRAVENOUS
  Administered 2014-07-22: 01:00:00 via INTRAVENOUS
  Administered 2014-07-22: 7.65 mg via INTRAVENOUS
  Administered 2014-07-22 (×2): via INTRAVENOUS
  Administered 2014-07-22: 14.54 mg via INTRAVENOUS
  Administered 2014-07-22: 5.54 mg via INTRAVENOUS
  Administered 2014-07-23: 7.63 mg via INTRAVENOUS
  Administered 2014-07-23: 6.49 mg via INTRAVENOUS
  Administered 2014-07-23 (×3): via INTRAVENOUS
  Administered 2014-07-23: 8.91 mg via INTRAVENOUS
  Filled 2014-07-21 (×13): qty 25

## 2014-07-21 MED ORDER — SENNA 8.6 MG PO TABS
1.0000 | ORAL_TABLET | Freq: Every day | ORAL | Status: DC
  Administered 2014-07-21 – 2014-07-24 (×3): 8.6 mg via ORAL
  Filled 2014-07-21 (×2): qty 1

## 2014-07-21 NOTE — Progress Notes (Signed)
Patient [id number] Nicholas Soto      DOB: [birth year]      JIR:[id number]   Palliative Medicine Team at [facility] Progress Note    Subjective: Pain doing better this morning.  Still having severe pain with movement.  Moving bowels daily and feels like things are too loose with his bowels. Tolerating diet.  No n/v.  Sleeping okay at night but waking up with severe pain at times.  Continuous to feel frustrated that there is not exact diagnosis for him.  He vocies that he doesn't even think this is cancer and is another nerve problem for him. We talked through this extensively.  Feeling less anxious here than he does at home per his report, but certainly worries about cancer diagnosis.      Filed Vitals:   07/21/14 0559  BP: 131/78  Pulse: 68  Temp: 97.4 F (36.3 C)  Resp: 15   Physical exam: GEN: Alert, NAD HEENT: [place],sclera anicteric CV: RRR LUNGS: CTAB, symm expan ABD: soft, ND, +BS EXT: no c/c/e NEURO: power +5/5 in bilat lower ext    Assessment and plan: 63 yo male with chronic back/leg pain, , HTN, COPD, remote h.o cancer in left eye who presented with severe back pain and found to have likely widely metastatic carcinoma.   1. Code Status: Full   2. Illness Understanding/GOC- See initial consult note. Path with poorly differentiated adenocarcinoma. Plan for repeat Bx on Monday.  Addressed advance care planning some.  Would want wife Nicholas Soto to be medical decision maker if he could not make decisions.  Reports talking to her about wishes at end of his life,but difficult for him to verbalize with me at this time. Nicholas Soto has poor medical literacy and insight in genral. This morning he was voicing that he did not even think this was cancer but a nerve problem like he had before and that Dr's just aren't figuring it out.  We talked through this some and why we think he has a cancer and importance of bx Monday to get clearer picture on type.  He also has poor insight into his pain  management and had been hitting PCA whenever he could and describes that this was what he is instructed by nuresing. I suspect this comes from nursing concern that he has had continued pain. He does frankly admit that he pushes pain button even when not having pain though.  We talked about his chronic pain and acute cancer related pain as well as importance of only using PCA bolus when he does have pain.  Pilar Plate conversation about dangers of using pain medicines to treat psychological distress and coping with illness.     3. Symptom Mangement  A. Cancer related pain- Used 40mg  IV dilaudid via PCA in past 24h as well as 4mg  IV nurse boluses.  Again discussed importance of using PCA only to treat pain and not hit button whenever it is possible.  There are times he is pain free, but lots of incidental pain with movement.  I will go ahead and increase his PCA settings to as follows;  Basal 1.2mg /hr , Bolus 1mg  q15 min.   - Continue adjuvant steroids, lyrica. would avoid bisphos with his poor dentition  - D/C nurse driven bolus as I think above settings and progress we made yesterday will make this not needed.  - D/C ambien PM as he did not take last night and I am concerned about several medicines combing and causing over sedation.  -  Please contact me if further adjustments needed  B. Wt Loss- Started Remeron. monitor appetite  C. Insomnia-remeron. Slept well, but severe pain in AM.  D. Anxiety/Depression- on ativan/paxil at home. Started remeron as above. Monitor. D/C ambien to avoid additive effects of sedative meds. Talked through importance of not using PCA to treat anxiety.    4. Psychosocial/Spiritual: Married for >40 years to his wife Nicholas Soto. They have 3 biological children and 2 adopted children. Most family lives near [place] area. Retired from [employer].     Time In: 730 Time Out: 810 Total Time 40 minutes  >50% of time spent in counseling and coordination of care as above  Doran Clay  D.O. Palliative Medicine Team at [facility]  Pager: [phone] Team Phone: [phone]

## 2014-07-21 NOTE — Progress Notes (Signed)
Patient ID: Nicholas Soto, male   DOB: 06-15-1951, 63 y.o.   MRN: [id number] [facility] PROGRESS NOTE  AUDON HEYMANN OEH:[id number] DOB: 1951-02-15 DOA: 07/13/2014 PCP: [name], MD  Brief narrative: 63 y.o. 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 Pt was found to have diffuse metastatic disease to his spine upon initial evaluation. Specifically, MRI of the lumbar spine on 07/13/14 showed widespread bony metastatic disease including L3 vertebral body on the right side leading to epidural tumor and tumor in the right neural foramen at L2-3. There was also involvement of the S1 and S2 neural foramina by tumor and sacral epidural tumor (S2 is on the left side). CT abdomen/ pelvis showed widespread disease in multiple lymph node regions and diffuse lytic bone mets. Radiation oncology was consulted due to patient's complaints of severe pain in right groin area. He started RT 07/16/14 with initial improvement in pain however over past 2 days his pain is largely uncontrolled. 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 Per oncology recommendations we need another CT-guided tissue biopsy for molecular profiling. Order placed. No evidence of cord compression. No neurological complaints. Pt  did have numbness in right groin area on 7/21 but this has improved after starting radiation therapy. Patient started radiation therapy 07/16/2014 with initial improvement in pain but over past 2 days his pain has largely been uncontrolled especially in the area of right groin. Appreciate palliative care assistance with pain management. Current pain regime is dilaudid PCA. In regards to shoulder pain patient had X rays done of both shoulders with no evidence of disease in those areas.   Adjuvant therapy is with Lyrica 75 mg by mouth twice a day, ativan 1 mg PO BID  Appreciate Dr. [name] of oncology following. So far kappa/lambda light chains are unremarkable. Will follow up on final results of UPEP and SPEP.  Continue Decadron 4 mg by mouth every 6 hours.  Active Problems:  TOBACCO ABUSE  Nicotine patch ordered. Tobacco cessation counseling per nursing staff. Anemia of chronic disease  Likely due to malignancy. No signs of active bleeding. Hemoglobin is stable at 11.6; no current indications for transfusion.  Chronic obstructive pulmonary disease / Acute respiratory failure with hypoxia  Continue Symbicort, Spiriva and xopenex when necessary.  Due to increased gurgling sounds scopolamine patch was ordered and it seems to be helping. Pt has less gurgling sounds on today's exam. Continue Singulair 10 mg daily.  Anxiety and depression  Continue Ativan 1 mg by mouth twice a day, Paxil 10 mg in the morning. Unspecified hereditary and idiopathic peripheral neuropathy  Burning pain in legs improved. Continue lyrica 75 mg PO BID. Functional quadriplegia  Limited mobility secondary to severe pain from what seems to be bone metastases.  Patient was evaluated by inpatient rehabilitation and their recommendation was for skilled nursing facility placement. I think it's reasonable  to see if patient improves with radiation therapy and hopefully we will be able to treat his pain so he would be able to  participate in physical therapy here and then transitioned to inpatient rehab. Loss of weight / severe protein calorie malnutrition  Cancer related cachexia. Dietitian consulted. Continue ensure supplementation.  DVT prophylaxis  Lovenox ordered while inpatient. No signs of bleeding.    Code Status: Full  Family Communication: Pt and wife at bedside  Disposition Plan: remains inpatient   Consultants:  Interventional radiology for CT guided biopsy  Radiation oncology (Dr. Thea Silversmith)  Oncology (Dr. Concha Norway)  Palliative care team (Dr. Deitra Mayo) Other consultants:  Dietitian  Inpatient rehabilitation  Physical therapy  Social work Procedures/Studies:  MRI lumbar spine 07/13/14:1. Unfortunately there are enhancing tumors in most of the lumbar vertebra, the sacrum, paraspinal tissues, subcutaneous tissues, retroperitoneum, and perirenal spaces most compatible with a diffuse metastatic process. Oncology referral with staging workup to determine a suitable biopsy site recommended. 2. Cortical breakthrough from the L3 vertebral body on the right side leads to epidural tumor and tumor in the right neural foramen at L2-3. There is also involvement of the S1 and S2 neural foramina by tumor and sacral epidural tumor as noted above. 3. Spondylosis and degenerative disc disease also cause moderate impingement at L3-4 and mild impingement at T11-12 and L4-5, as noted above.  CT of the chest, abdomen and pelvis 07/13/14: Bulky mediastinal lymphadenopathy with mild lymphadenopathy also seen in the right axilla, bilateral pericardial spaces, and retrocrural regions, consistent metastatic disease. Diffuse body wall soft tissue metastases throughout the chest, abdomen, and pelvis. Diffuse mesenteric and retroperitoneal metastases throughout the abdomen and pelvis. Diffuse lytic bone metastases. Probable small splenic metastases.  CT Biopsy 07/15/2014 CT-guided core biopsy of right retroperitoneal soft  tissue mass.  Antibiotics:  None   [name], MD  [facility] Pager [phone]  If 7PM-7AM, please contact night-coverage [email or web address] Password [facility] 07/21/2014, 11:43 AM   LOS: 8 days    HPI/Subjective: No acute overnight events. PAin soemwaht better this am but has been using PCA pretty consistently.   Objective: Filed Vitals:   07/21/14 0559 07/21/14 1015 07/21/14 1100 07/21/14 1130  BP: 131/78  141/67   Pulse: 68  72   Temp: 97.4 F (36.3 C)     TempSrc: Oral     Resp: 15 14    Height:      Weight:      SpO2: 98% 93% 94% 93%    Intake/Output Summary (Last 24 hours) at 07/21/14 1143 Last data filed at 07/21/14 0600  Gross per 24 hour  Intake 1242.54 ml  Output   1650 ml  Net -407.46 ml    Exam:   General:  Pt is alert, follows commands appropriately, not in acute distress  Cardiovascular: Regular rate and rhythm, S1/S2, no murmurs  Respiratory: less gurgling sounds today, wheezing in upper lung lobes  Abdomen: Soft, non tender, non distended, bowel sounds present  Extremities: No edema, pulses DP and PT palpable bilaterally  Neuro: Grossly nonfocal  Data Reviewed: Basic Metabolic Panel:  Recent Labs Lab 07/16/14 0003 07/17/14 0458 07/20/14 0349 07/21/14 0500  NA 138 137  --  137  K 4.4 4.2  --  4.6  CL 100 98  --  95*  CO2 28 27  --  31  GLUCOSE 104* 142*  --  139*  BUN 22 27*  --  25*  CREATININE 0.83 0.73 0.76 0.64  CALCIUM  8.8 8.9  --  8.9   Liver Function Tests: No results found for this basename: AST, ALT, ALKPHOS, BILITOT, PROT, ALBUMIN,  in the last 168 hours No results found for this basename: LIPASE, AMYLASE,  in the last 168 hours No results found for this basename: AMMONIA,  in the last 168 hours CBC:  Recent Labs Lab 07/16/14 0003 07/17/14 0458 07/21/14 0500  WBC 8.0 11.2* 12.8*  HGB 11.5* 12.1* 11.6*  HCT 34.3* 35.7* 35.7*  MCV 88.4 88.4 90.8  PLT 289 297 273   Cardiac Enzymes: No results found for this  basename: CKTOTAL, CKMB, CKMBINDEX, TROPONINI,  in the last 168 hours BNP: No components found with this basename: POCBNP,  CBG: No results found for this basename: GLUCAP,  in the last 168 hours  No results found for this or any previous visit (from the past 240 hour(s)).   Studies: No results found.  Scheduled Meds: . budesonide-formoterol  2 puff Inhalation QHS  . dexamethasone  4 mg Oral 4 times per day  . enoxaparin (LOVENOX) injection  40 mg Subcutaneous Q24H  . feeding supplement (ENSURE COMPLETE)  237 mL Oral TID BM  . HYDROmorphone PCA 0.3 mg/mL   Intravenous 6 times per day  . LORazepam  1 mg Oral BID  . mirtazapine  15 mg Oral QHS  . montelukast  10 mg Oral q morning - 10a  . nicotine  21 mg Transdermal Daily  . pantoprazole  40 mg Oral q morning - 10a  . PARoxetine  10 mg Oral q morning - 10a  . pregabalin  75 mg Oral BID  . scopolamine  1 patch Transdermal Q72H  . senna  1 tablet Oral Daily  . tiotropium  18 mcg Inhalation Daily

## 2014-07-22 ENCOUNTER — Ambulatory Visit
Admit: 2014-07-22 | Discharge: 2014-07-22 | Disposition: A | Discharge status: Home / Self Care | Payer: 59 | Attending: Radiation Oncology | Admitting: Radiation Oncology

## 2014-07-22 ENCOUNTER — Encounter (HOSPITAL_COMMUNITY): Payer: Self-pay | Admitting: *Deleted

## 2014-07-22 ENCOUNTER — Ambulatory Visit (HOSPITAL_COMMUNITY): Payer: 59

## 2014-07-22 DIAGNOSIS — C786 Secondary malignant neoplasm of retroperitoneum and peritoneum: Secondary | ICD-10-CM

## 2014-07-22 DIAGNOSIS — C7951 Secondary malignant neoplasm of bone: Secondary | ICD-10-CM

## 2014-07-22 DIAGNOSIS — R1909 Other intra-abdominal and pelvic swelling, mass and lump: Secondary | ICD-10-CM

## 2014-07-22 LAB — CBC
HEMATOCRIT: 34.9 % — AB (ref 39.0–52.0)
HEMOGLOBIN: 11.5 g/dL — AB (ref 13.0–17.0)
MCH: 30 pg (ref 26.0–34.0)
MCHC: 33 g/dL (ref 30.0–36.0)
MCV: 91.1 fL (ref 78.0–100.0)
Platelets: 257 10*3/uL (ref 150–400)
RBC: 3.83 MIL/uL — ABNORMAL LOW (ref 4.22–5.81)
RDW: 13.7 % (ref 11.5–15.5)
WBC: 11.9 10*3/uL — ABNORMAL HIGH (ref 4.0–10.5)

## 2014-07-22 LAB — UIFE/LIGHT CHAINS/TP QN, 24-HR UR
ALPHA 1 UR: DETECTED — AB
Albumin, U: DETECTED
Alpha 2, Urine: DETECTED — AB
Beta, Urine: DETECTED — AB
FREE KAPPA LT CHAINS, UR: 14.3 mg/dL — AB (ref 0.14–2.42)
FREE KAPPA/LAMBDA RATIO: 10.07 ratio (ref 2.04–10.37)
Free Lambda Lt Chains,Ur: 1.42 mg/dL — ABNORMAL HIGH (ref 0.02–0.67)
Gamma Globulin, Urine: DETECTED — AB
Total Protein, Urine: 16.3 mg/dL

## 2014-07-22 MED ORDER — FENTANYL CITRATE 0.05 MG/ML IJ SOLN
INTRAMUSCULAR | Status: AC
  Filled 2014-07-22: qty 6

## 2014-07-22 MED ORDER — MIDAZOLAM HCL 2 MG/2ML IJ SOLN
INTRAMUSCULAR | Status: AC | PRN
  Administered 2014-07-22: 1 mg via INTRAVENOUS

## 2014-07-22 MED ORDER — FENTANYL CITRATE 0.05 MG/ML IJ SOLN
INTRAMUSCULAR | Status: AC | PRN
  Administered 2014-07-22: 50 ug via INTRAVENOUS

## 2014-07-22 MED ORDER — MIDAZOLAM HCL 2 MG/2ML IJ SOLN
INTRAMUSCULAR | Status: AC
  Filled 2014-07-22: qty 6

## 2014-07-22 NOTE — Progress Notes (Signed)
Progress Note   Nicholas Soto DJS:[id number] DOB: 1951-10-18 DOA: 07/13/2014 PCP: [name], MD   Brief Narrative:   Nicholas Soto is an 63 y.o. 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 Pt was found to have diffuse metastatic disease to his spine upon initial evaluation. CT abdomen/ pelvis showed widespread disease in multiple lymph node regions and diffuse lytic bone mets. Radiation oncology was consulted due to patient's complaints of severe pain in right groin area. He started RT 07/16/14.  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 Per oncology recommendations another CT-guided tissue biopsy for molecular profiling as needed. Order placed.  No evidence of cord compression. No neurological complaints.   Patient started radiation therapy 07/16/2014.   Palliative care team following for assistance with pain management. Current pain regime is dilaudid PCA.   With regard to shoulder pain patient had X rays done of both shoulders with no evidence of disease in those areas.   Continue adjuvant therapy with Lyrica 75 mg by mouth twice a day, ativan 1 mg PO BID and Decadron 4 mg every 6 hours.   Dr. [name] of oncology following with ongoing workup. So far kappa/lambda  light chains are unremarkable. Will follow up on final results of UPEP and SPEP.   Active Problems:  TOBACCO ABUSE   Nicotine patch ordered. Tobacco cessation counseling per nursing staff.  Anemia of chronic disease   Likely due to malignancy.  No signs of active bleeding. Hemoglobin stable.  Chronic obstructive pulmonary disease / Acute respiratory failure with hypoxia   Continue Symbicort, Spiriva and xopenex when necessary.   Anxiety and depression  Continue Ativan 1 mg by mouth twice a day, Paxil 10 mg in the morning.  Unspecified hereditary and idiopathic peripheral neuropathy   Burning pain in legs improved. Continue lyrica 75 mg PO BID.  Functional quadriplegia   Limited mobility secondary to severe pain from bone metastases.   Will likely need SNF placement.  Loss of weight / severe protein calorie malnutrition   Weight loss felt to be secondary to cancer related cachexia. Dietitian consulted. Continue ensure supplementation.   DVT prophylaxis   Lovenox ordered while inpatient. No signs of bleeding.   Code Status: Full. Family Communication: Wife updated at bedside. Disposition Plan: Home when stable.   IV Access:    Peripheral IV   Procedures and diagnostic studies:    MRI lumbar spine 07/13/14:1. Unfortunately there are enhancing tumors in most of the lumbar vertebra, the sacrum, paraspinal tissues, subcutaneous tissues, retroperitoneum, and perirenal spaces most compatible with a diffuse metastatic process. Oncology referral with staging workup to determine a suitable biopsy site recommended. 2. Cortical breakthrough from the L3 vertebral body on the right side leads to epidural tumor and tumor in the right neural foramen at L2-3. There is also involvement of the S1 and S2 neural foramina by  tumor and sacral epidural tumor as noted above. 3. Spondylosis and degenerative disc disease also cause moderate impingement at L3-4 and mild impingement at T11-12 and  L4-5, as noted above.   CT of the chest, abdomen and pelvis 07/13/14: Bulky mediastinal lymphadenopathy with mild lymphadenopathy also seen in the right axilla, bilateral pericardial spaces, and retrocrural regions, consistent metastatic disease. Diffuse body wall soft tissue metastases throughout the chest, abdomen, and pelvis. Diffuse mesenteric and retroperitoneal metastases throughout the abdomen and pelvis. Diffuse lytic bone metastases. Probable small splenic metastases.   CT Biopsy 07/15/2014 CT-guided core biopsy of right retroperitoneal soft tissue mass.   Left shoulder films 07/17/14: No acute bone abnormality to the left shoulder. Cannot exclude underlying rotator cuff disease. Evidence for carotid artery calcifications.  Right shoulder films 07/17/14: No acute bone abnormality to the right shoulder. Mediastinal fullness consistent with known mediastinal lymphadenopathy.    Medical Consultants:    Dr. Charlett Blake, Physical Medicine and Rehabilitation  Dr. Isaac Laud, Palliative Care  Dr. Concha Norway, Oncology  Dr. Jacqulynn Cadet, IR   Other Consultants:    Physical therapy  Dietitian   Anti-Infectives:    None.  Subjective:   Nicholas Soto is sedated post biopsy.  Objective:    Filed Vitals:   07/22/14 0032 07/22/14 0213 07/22/14 0400 07/22/14 0744  BP:  126/55    Pulse:  75    Temp:  97.7 F (36.5 C)    TempSrc:  Oral    Resp: 14 12 18 12   Height:      Weight:      SpO2: 93% 98% 95% 94%    Intake/Output Summary (Last 24 hours) at 07/22/14 0804 Last data filed at 07/21/14 2125  Gross per 24 hour  Intake    240 ml  Output      0 ml  Net    240 ml    Exam: Gen:  Sedated Cardiovascular:  RRR, No M/R/G Respiratory:  Lungs coarse Gastrointestinal:  Abdomen soft, NT/ND, + BS Extremities:  No C/E/C   Data Reviewed:    Labs: Basic Metabolic Panel:  Recent Labs Lab 07/16/14 0003 07/17/14 0458 07/20/14 0349  07/21/14 0500  NA 138 137  --  137  K 4.4 4.2  --  4.6  CL 100 98  --  95*  CO2 28 27  --  31  GLUCOSE 104* 142*  --  139*  BUN 22 27*  --  25*  CREATININE 0.83 0.73 0.76 0.64  CALCIUM 8.8 8.9  --  8.9   GFR Estimated Creatinine Clearance: 93.3 ml/min (by C-G formula based on Cr of 0.64).  CBC:  Recent Labs Lab 07/16/14 0003 07/17/14 0458 07/21/14 0500 07/22/14 0405  WBC 8.0 11.2* 12.8* 11.9*  HGB 11.5* 12.1* 11.6* 11.5*  HCT 34.3* 35.7* 35.7* 34.9*  MCV 88.4 88.4 90.8 91.1  PLT 289 297 273 257   Microbiology No results found for this or any previous visit (from the past 240 hour(s)).   Medications:   . antiseptic oral rinse  15 mL Mouth Rinse BID  . budesonide-formoterol  2 puff Inhalation QHS  . dexamethasone  4 mg Oral 4 times per day  . enoxaparin (LOVENOX) injection  40 mg Subcutaneous Q24H  . feeding supplement (ENSURE COMPLETE)  237 mL Oral TID BM  . HYDROmorphone PCA 0.3 mg/mL   Intravenous 6 times per day  . LORazepam  1 mg Oral BID  . mirtazapine  15 mg Oral  QHS  . montelukast  10 mg Oral q morning - 10a  . nicotine  21 mg Transdermal Daily  . pantoprazole  40 mg Oral q morning - 10a  . PARoxetine  10 mg Oral q morning - 10a  . pregabalin  75 mg Oral BID  . scopolamine  1 patch Transdermal Q72H  . senna  1 tablet Oral Daily  . tiotropium  18 mcg Inhalation Daily   Continuous Infusions:   Time spent: 25 minutes.   LOS: 9 days   [facility] Pager [phone]. If unable to reach me by pager, please call my cell phone at [phone].  *Please refer to [email or web address], password [email or web address] to get updated schedule on who will round on this patient, as hospitalists switch teams weekly. If 7PM-7AM, please contact night-coverage at [email or web address], password [email or web address] for any overnight needs.  07/22/2014, 8:04 AM    **Disclaimer: This note was dictated with voice recognition software. Similar sounding words can inadvertently be transcribed and this note  may contain transcription errors which may not have been corrected upon publication of note.**

## 2014-07-22 NOTE — Progress Notes (Signed)
Patient ZO:[id number] Nicholas Soto      DOB: [birth year]      WUJ:[id number]   Palliative Medicine Team at [facility] Progress Note    Subjective: Continues to use IV dilaudid frequently.  Using a little less PRN (30 demands in past 24h), but still frequent use.  No nausea vomiting. Moving bowels.  To have biopsy today. Slept very well last night. Reports no problems with confusion or breathing.     Filed Vitals:   07/22/14 0744  BP:   Pulse:   Temp:   Resp: 12   Physical exam: GEN: Alert, NAD  HEENT: Nicholas Soto,sclera anicteric  CV: RRR  LUNGS: CTAB, symm expan  ABD: soft, ND, +BS  EXT: no c/c/e  NEURO: power +5/5 in bilat lower ext     CBC    Component Value Date/Time   WBC 11.9* 07/22/2014 0405   RBC 3.83* 07/22/2014 0405   HGB 11.5* 07/22/2014 0405   HCT 34.9* 07/22/2014 0405   PLT 257 07/22/2014 0405   MCV 91.1 07/22/2014 0405   MCH 30.0 07/22/2014 0405   MCHC 33.0 07/22/2014 0405   RDW 13.7 07/22/2014 0405   LYMPHSABS 0.9 01/27/2012 0909   MONOABS 0.5 01/27/2012 0909   EOSABS 0.0 01/27/2012 0909   BASOSABS 0.0 01/27/2012 0909     Assessment and plan: 63 yo male with chronic back/leg pain, , HTN, COPD, remote h.o cancer in left eye who presented with severe back pain and found to have likely widely metastatic carcinoma.   1. Code Status: Full   2. Illness Understanding/GOC- See initial consult note. Path with poorly differentiated adenocarcinoma. Plan for repeat Bx on Monday. Addressed advance care planning some. Would want wife Nicholas Soto to be medical decision maker if he could not make decisions. Reports talking to her about wishes at end of his life,but difficult for him to verbalize with me at this time. Kacee has poor medical literacy and insight in genral. See previous documentation for further details.   3. Symptom Mangement  A. Cancer related pain- Used 50mg  IV dilaudid in past 24h.  Again admits to hitting button when he does not have pain. Re-educated on this. Will keep  PCA at current settings and plan on transition off tomorrow.  50mg  IV dilaudid is roughly equivalent to 650mg  of oral oxycodone. His chronic pain, anxiety, and trouble coping with new diagnosis are almost all certainly playing significant role in his difficult to control pain.  - Continue adjuvant steroids, lyrica. would avoid bisphos with his poor dentition  - Will allow PCA to continue until after bx and plan on switching to oral regimen tomorrow - I think he would be a great candidate for methadone, but doubt we will find outpatient provider to prescribe locally.  - Please contact me if further adjustments needed  B. Wt Loss- Started Remeron. Appetite doing much better.  C. Insomnia-remeron. Slept well, but severe pain in AM.  D. Anxiety/Depression- on ativan/paxil at home. Started remeron as above. Monitor. D/C ambien to avoid additive effects of sedative meds. Talked through importance of not using PCA to treat anxiety. He will hang button on PCA instead of having it in bed with him  4. Psychosocial/Spiritual: Married for >40 years to his wife Nicholas Soto. They have 3 biological children and 2 adopted children. Most family lives near [place] area. Retired from [employer].   Time In: 930 Time Out: 955  Total Time 25 minutes   >50% of time spent in counseling and coordination of  care as above   Doran Clay D.O.  Palliative Medicine Team at [facility]  Pager: [phone]  Team Phone: [phone]

## 2014-07-22 NOTE — Progress Notes (Addendum)
Clinical Social Work Department CLINICAL SOCIAL WORK PLACEMENT NOTE 07/22/2014  Patient:  Nicholas Soto, Nicholas Soto  Account Number:  [id number] Admit date:  07/13/2014  Clinical Social Worker:  Ulyess Blossom  Date/time:  07/22/2014 11:00 AM  Clinical Social Work is seeking post-discharge placement for this patient at the following level of care:   SKILLED NURSING   (*[name] will update this form in Epic as items are completed)   07/22/2014  Patient/family provided with [place] Department of Clinical Social Work's list of facilities offering this level of care within the geographic area requested by the patient (or if unable, by the patient's family).  07/22/2014  Patient/family informed of their freedom to choose among providers that offer the needed level of care, that participate in Medicare, Medicaid or managed care program needed by the patient, have an available bed and are willing to accept the patient.  07/22/2014  Patient/family informed of [facility]' ownership interest in [facility], as well as of the fact that they are under no obligation to receive care at this facility.  PASARR submitted to EDS on 07/22/2014 PASARR number received on 07/22/2014  FL2 transmitted to all facilities in geographic area requested by pt/family on  07/22/2014 FL2 transmitted to all facilities within larger geographic area on   Patient informed that his/her managed care company has contracts with or will negotiate with  certain facilities, including the following:     Patient/family informed of bed offers received:  07/23/2014 Patient chooses bed at [facility] and [place] recommends and patient chooses bed at    Patient to be transferred to  on  [facility] Patient to be transferred to facility by ambulance Corey Harold) Patient and family notified of transfer on 07/24/2014 Name of family member notified:  Pt and pt wife, Nicholas Soto  The following physician  request were entered in Epic:   Additional Comments:   [name], MSW, [place] Work [phone]

## 2014-07-22 NOTE — Progress Notes (Addendum)
[name] continuing to follow for disposition planning.  [name] met with pt and pt wife, Karna Christmas at bedside. [name] provided support as pt discussed that his pain is better managed and per PMT MD, hopeful to begin to wean off of the dilaudid PCA tomorrow.   Pt and pt wife discussed that they are hopeful that pt pain continues to be managed well which may improve his participation with PT in order to [place] to reevaluate.  [name] discussed the need to explore rehab at SNF facilities to have another option available for if [facility] continues to feel that pt would be unable to tolerate intense therapy.  Pt and pt wife agreeable to [facility] search. [name] clarified pt and pt wife questions and provided SNF list.  [name] completed FL2 and initiated SNF search to [facility].  [name] to follow up with pt and pt wife regarding SNF bed offers.   [name] to continue to follow and facilitate pt discharge needs when pt medically ready for discharge.  [name], MSW, [place] Work [phone]

## 2014-07-22 NOTE — Progress Notes (Signed)
PT Cancellation Note  Patient Details Name: Nicholas Soto MRN: [id number] DOB: August 02, 1951   Cancelled Treatment:    Reason Eval/Treat Not Completed:  (after procedure-biopsy)   [name] 07/22/2014, 2:17 PM Tresa Endo PT [phone]

## 2014-07-22 NOTE — Progress Notes (Signed)
  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 . . . . . . . The patient presents for routine under treatment assessment. He continues to be admitted for her overall management of his situation                                   The patient is without complaint.                                 Set-up films were reviewed.                                 The chart was checked. Physical Findings. . .  vitals were not taken for this visit.. Weight essentially stable.  The lungs are clear. The heart has regular rhythm and rate. The abdomen is soft and nontender with normal bowel sounds. Impression . . . . . . . The patient is tolerating radiation. Plan . . . . . . . . . . . . Continue treatment as planned. The patient did undergo an additional biopsy earlier today with results pending.  ________________________________   Blair Promise, PhD, MD

## 2014-07-22 NOTE — Progress Notes (Signed)
[facility] Radiation Oncology Dept Therapy Treatment Record Phone [phone]   Radiation Therapy was administered to Nicholas Soto on: 07/22/2014  4:56 PM and was treatment # 5 out of a planned course of 10 treatments.

## 2014-07-22 NOTE — Procedures (Signed)
CT core right retroperitoneal mass 96E x6 No complication No blood loss. See complete dictation in  [facility].

## 2014-07-23 ENCOUNTER — Ambulatory Visit: Admit: 2014-07-23 | Payer: 59 | Admitting: Radiation Oncology

## 2014-07-23 ENCOUNTER — Ambulatory Visit
Admit: 2014-07-23 | Discharge: 2014-07-23 | Disposition: A | Discharge status: Home / Self Care | Payer: 59 | Attending: Radiation Oncology | Admitting: Radiation Oncology

## 2014-07-23 DIAGNOSIS — E43 Unspecified severe protein-calorie malnutrition: Secondary | ICD-10-CM

## 2014-07-23 MED ORDER — HYDROMORPHONE HCL PF 2 MG/ML IJ SOLN
2.0000 mg | INTRAMUSCULAR | Status: DC | PRN
  Administered 2014-07-23 – 2014-07-24 (×5): 2 mg via INTRAVENOUS
  Filled 2014-07-23 (×5): qty 1

## 2014-07-23 MED ORDER — OXYCODONE HCL 5 MG PO TABS
40.0000 mg | ORAL_TABLET | ORAL | Status: DC | PRN
  Administered 2014-07-23: 40 mg via ORAL
  Administered 2014-07-24 (×3): 60 mg via ORAL
  Filled 2014-07-23 (×3): qty 12
  Filled 2014-07-23: qty 8

## 2014-07-23 MED ORDER — OXYCODONE HCL ER 40 MG PO T12A
120.0000 mg | EXTENDED_RELEASE_TABLET | Freq: Three times a day (TID) | ORAL | Status: DC
  Administered 2014-07-23 – 2014-07-24 (×4): 120 mg via ORAL
  Filled 2014-07-23 (×4): qty 3

## 2014-07-23 NOTE — Progress Notes (Signed)
Nicholas Soto   DOB:17-Jun-1951   NO#:[id number]   QHU#:[id number]  Subjective: Patient states he feels much better than last week.  He is having bowel movements. Tolerated repeat biopsy yesterday.     Objective:  Filed Vitals:   07/23/14 1236  BP:   Pulse:   Temp:   Resp: 14    Body mass index is 24.3 kg/(m^2).  Intake/Output Summary (Last 24 hours) at 07/23/14 1615 Last data filed at 07/23/14 1400  Gross per 24 hour  Intake 1068.66 ml  Output   2350 ml  Net -1281.34 ml     Sclerae unicteric  Oropharynx clear  Lungs, decreased breath sounds at bases  Heart regular rate and rhythm  Abdomen benign  MSK  no peripheral edema, atrophy  Neuro, weakness in lower extremities bilaterally    CBG (last 3)  No results found for this basename: GLUCAP,  in the last 72 hours   Labs:  Lab Results  Component Value Date   WBC 11.9* 07/22/2014   HGB 11.5* 07/22/2014   HCT 34.9* 07/22/2014   MCV 91.1 07/22/2014   PLT 257 07/22/2014   NEUTROABS 6.1 4/65/6812    Basic Metabolic Panel:  Recent Labs Lab 07/17/14 0458 07/20/14 0349 07/21/14 0500  NA 137  --  137  K 4.2  --  4.6  CL 98  --  95*  CO2 27  --  31  GLUCOSE 142*  --  139*  BUN 27*  --  25*  CREATININE 0.73 0.76 0.64  CALCIUM 8.9  --  8.9   GFR Estimated Creatinine Clearance: 93.3 ml/min (by C-G formula based on Cr of 0.64). Liver Function Tests: No results found for this basename: AST, ALT, ALKPHOS, BILITOT, PROT, ALBUMIN,  in the last 168 hours No results found for this basename: LIPASE, AMYLASE,  in the last 168 hours No results found for this basename: AMMONIA,  in the last 168 hours Coagulation profile No results found for this basename: INR, PROTIME,  in the last 168 hours  CBC:  Recent Labs Lab 07/17/14 0458 07/21/14 0500 07/22/14 0405  WBC 11.2* 12.8* 11.9*  HGB 12.1* 11.6* 11.5*  HCT 35.7* 35.7* 34.9*  MCV 88.4 90.8 91.1  PLT 297 273 257   Cardiac Enzymes: No results found for this  basename: CKTOTAL, CKMB, CKMBINDEX, TROPONINI,  in the last 168 hours BNP: No components found with this basename: POCBNP,  CBG: No results found for this basename: GLUCAP,  in the last 168 hours D-Dimer No results found for this basename: DDIMER,  in the last 72 hours Hgb A1c No results found for this basename: HGBA1C,  in the last 72 hours Lipid Profile No results found for this basename: CHOL, HDL, LDLCALC, TRIG, CHOLHDL, LDLDIRECT,  in the last 72 hours Thyroid function studies No results found for this basename: TSH, T4TOTAL, FREET3, T3FREE, THYROIDAB,  in the last 72 hours Anemia work up No results found for this basename: VITAMINB12, FOLATE, FERRITIN, TIBC, IRON, RETICCTPCT,  in the last 72 hours Microbiology No results found for this or any previous visit (from the past 240 hour(s)).  PATHOLOGY: Diagnosis Soft Tissue Needle Core Biopsy - POORLY DIFFERENTIATED CARCINOMA, SEE COMMENT. Microscopic Comment The carcinoma demonstrates the following immunophenotype: Cytokeratin AE1/3 - strong diffuse expression. CDX2 - negative expression. TTF-1 - negative expression. PSA - negative expression. Chromogranin - negative expression. CD56 - negative expression. Melan-A - negative expression. S-100 - negative expression. Cytokeratin 5/6 - negative expression. Cytokeratin 7 - strong  diffuse expression. Cytokeratin 20 - negative expression. Cytokeratin 903 - patchy moderate to strong expression. Hep Par 1 - focal moderate to strong expression. Inhibin - negative expression. Napsin A - negative expression. p63 - negative expression. Mucicarmine - focal moderate to strong expression. Overall the morphology and immunophenotype are that of nonspecific poorly differentiated carcinoma with focal areas of adenocarcinomatous differentiation. Although the immunophenotype is nonspecific, the immunophenotype does not support melanocytic differentiation, prostatic primary, lung  primary, neuroendocrine differentiation, lower gastrointestinal differentiation, adrenocortical origin or hepatocellular origin. The case was reviewed with Dr Gari Crown who essentially concurs. (CRR:ecj 07/18/2014) [name] DO Pathologist, Electronic Signature (Case signed 07/18/2014)  FURTHER PATHOLOGY: Diagnosis Retroperitoneal mass, biopsy, right - METASTATIC POORLY DIFFERENTIATED ADENOCARCINOMA. PLEASE SEE COMMENT. Microscopic Comment The biopsies show poorly differentiated malignant cells with significant nuclear pleomorphism, intracytoplasmic mucin and some clear cytoplasm with associated tumor necrosis, arranged in acinar and cribriform patterns. Extensive immunostains were performed in the previous case ([id number]). Given the morphologic features, positive immunoreactivity of the tumor cells to cytokeratin 7, CKAE1/3/AE3, and negative reactivity to p63, a metastatic lung primary tumor cannot be completely excluded. Per my conversation with Dr. [name], the tumor block will be sent for [facility] for molecular analysis and the report will be available in EPIC. (HCL:kh 07-23-14) Aldona Bar MD Pathologist, Electronic Signature (Case signed 07/23/2014) Studies:  Ct Biopsy  07/22/2014   CLINICAL DATA:  Retroperitoneal mass. Previous biopsy positive. Additional material needed for molecular profiling.  EXAM: CT GUIDED CORE BIOPSY OF RIGHT RETROPERITONEAL MASS  ANESTHESIA/SEDATION: Intravenous Fentanyl and Versed were administered as conscious sedation during continuous cardiorespiratory monitoring by the radiology RN, with a total moderate sedation time of less than 30 minutes.  PROCEDURE: The procedure risks, benefits, and alternatives were explained to the patient. Questions regarding the procedure were encouraged and answered. The patient understands and consents to the procedure.  Select axial scans through the mid abdomen were obtained, the dominant right retroperitoneal mass was  localized, and an appropriate skin entry site was localized.  The operative field was prepped with Betadinein a sterile fashion, and a sterile drape was applied covering the operative field. A sterile gown and sterile gloves were used for the procedure. Local anesthesia was provided with 1% Lidocaine.  Under CT fluoroscopic guidance, a 17 gauge trocar needle was advanced to the margin of the lesion. Once needle tip position was confirmed, coaxial 18-gauge core biopsy samples were obtained, submitted in formalin to surgical pathology. The guide needle was removed. Postprocedure scans show no hemorrhage or other apparent complication. The patient tolerated the procedure well.  Complications: None immediate  FINDINGS: The dominant right retroperitoneal mass was again identified. Percutaneous core biopsies were obtained under CT guidance.  IMPRESSION: 1. Technically successful CT-guided core biopsy of right retroperitoneal mass.   Electronically Signed   By: Arne Cleveland M.D.   On: 07/22/2014 14:38    Assessment: 63 y.o.    Plan:  1. 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 Nicoletta Dress of pathology.  Given adenocarcinoma based on above, molecular profiling has been [facility].   Multiple myeloma excluded with normal free light chain ratio and absent m-spike.  PSA is normal.  --We will await EGFR (gefitinib, afatinib,erlotinib ) and ALK (crizotinib) and ROS1 (crizotinb) testing.  We will consider further treatment based on results. We will start platinum based treatment with Carbo plus a taxane plus/minus bevacizumab wiith completion of his radiation.  I again expressed that due to extent of his Stage IV disease with bone metastases, his malignancy is not curable and therapy would be palliative in nature.  We will facilitate close follow up with [place] in the office. .    2. Intractable right groin, back pain secondary  to # 1.  --Continue radiation per Radiation oncology. He reports significant improvement.  --Continue Decadron 4 mg every 6 hours.   3. 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  --No vision changes.  One may consider mRI of the brain if brain mets are suspected, pending of formal diagnosis to complete staging.   4. Anemia, mild. --He is asymptomatic.   5. Protein calorie malnutrition, severe. --Secondary to #1. Ensure supplementation.  Albumin 3.4.  6. ECOG 2.  7. Full Code  Palliative Care evaluation ongoing to help with symptoms  We will follow this patient with you.    Pinkey Mcjunkin, MD 07/23/2014  4:15 PM

## 2014-07-23 NOTE — Progress Notes (Signed)
[name] continuing to follow for disposition planning.  [name] followed up with pt and pt wife at bedside. Pt reports pain has been managed well and expressed his eagerness to transition to oral medication and be able to discharge from the hospital. [name] provided supportive counseling as pt discussed his adjustment to his new diagnosis and his frustration prior to hospitalization while attempting to determine the cause of his symptoms. Pt is eager to learn the results of the additional biopsy from yesterday in order to have better clarification from oncologist about what treatment options may be available after pt completes radiation.   [name] discussed SNF bed offers with pt and pt wife. Pt and pt wife choose [facility]. Per Ritta Slot, pt must be off PCA pump for facility to be able to meet pt needs. Per PMT MD note yesterday, plans to transition to oral regimen today. Pt and pt wife discussed that [facility] would be second option if for some reason pt remains on the PCA pump.   [name] contacted [facility] and Rehab and notified facility of pt acceptance of bed offer. [facility] request [name] to send updated notes as pt transitions to oral regimen, but as long as pt off PCA, [facility] able to meet pt needs.   [name] to continue to follow to assist with pt disposition planning and provide support to pt and pt wife.   [name], MSW, [place] Work [phone]

## 2014-07-23 NOTE — Progress Notes (Signed)
Physical Therapy Treatment Patient Details Name: Nicholas Soto MRN: [id number] DOB: [birth year] Today's Date: 07/23/2014    History of Present Illness pt admitted 07/13/14 patient had severe pain which limited ambulation, for which he was brought to [facility] by EMS.  MRI of the lumbar spine on 07/13/14 showed widespread bony metastatic disease includin L3 vertebral body on the right side leading to epidural tumor and tumor in the right neural foramen at L2-3. There was also involvement of the S1 and S2 neural foramina by tumor and sacral epidural tumor (S2 was on the left side). CT abdomen/ pelvis showed widespread disease in multiple lymph node regions and diffuse lytic bone mets.    PT Comments    Pt continues to have increase in R leg pain the longer he walks. The leg will buckle at some point due to discomfort. Pt is giving his best effort in spite of obvious pain.  Follow Up Recommendations  SNF;Supervision for mobility/OOB     Equipment Recommendations  Rolling walker with 5" wheels    Recommendations for Other Services       Precautions / Restrictions Precautions Precautions: Fall Precaution Comments: R knee will buckle, increased pain with mobility    Mobility  Bed Mobility Overal bed mobility: Needs Assistance Bed Mobility: Supine to Sit     Supine to sit: Min guard     General bed mobility comments: has improved in mobility, cues to roll then sit from side  Transfers Overall transfer level: Needs assistance Equipment used: Rolling walker (2 wheeled) Transfers: Sit to/from Stand Sit to Stand: Min assist;+2 safety/equipment         General transfer comment: 50% VC's on proper tech and hand placement  Ambulation/Gait Ambulation/Gait assistance: Mod assist;+2 safety/equipment Ambulation Distance (Feet): 90 Feet (then 40) Assistive device: Rolling walker (2 wheeled) Gait Pattern/deviations: Step-to pattern;Decreased stance time - right;Antalgic Gait velocity:  decreased   General Gait Details: cues for safety , increased support due to increased pain of R leg.  Chair following behind for safety as R knee will buckle due to pain/fatigue.   Stairs            Wheelchair Mobility    Modified Rankin (Stroke Patients Only)       Balance Overall balance assessment: Needs assistance Sitting-balance support: Feet supported;Single extremity supported Sitting balance-Leahy Scale: Fair                              Cognition Arousal/Alertness: Awake/alert                          Exercises      General Comments        Pertinent Vitals/Pain At rest < 4, walking 10,PCA used x 2    Home Living                      Prior Function            PT Goals (current goals can now be found in the care plan section) Acute Rehab PT Goals Time For Goal Achievement: 07/31/14 Potential to Achieve Goals: Good Progress towards PT goals: Progressing toward goals    Frequency  Min 3X/week    PT Plan Discharge plan needs to be updated    Co-evaluation             End of Session Equipment Utilized During Treatment:  Gait belt Activity Tolerance: Patient limited by pain Patient left: in chair;with call bell/phone within reach     Time: 1321-1344 PT Time Calculation (min): 23 min  Charges:  $Gait Training: 23-37 mins                    G Codes:      [name] 07/23/2014, 2:05 PM Tresa Endo PT [phone]

## 2014-07-23 NOTE — Progress Notes (Signed)
Patient Nicholas Soto      DOB: April 18, 1951      KZS:[id number]   Palliative Medicine Team at [facility] Progress Note    Subjective: Pain doing much better. Using PCA much less. With continuous infusion and bolus dosing used ~42mg  of IV dilaudid in past 24 hours.  Feels like pain is still present and worse after moving, but getting better.  Moving bowels. Appetite good. No N/v.      Filed Vitals:   07/23/14 1236  BP:   Pulse:   Temp:   Resp: 14   Physical exam: GEN: Alert, NAD  HEENT: [place],sclera anicteric  CV: RRR  LUNGS: CTAB, symm expan  ABD: soft, ND, +BS  EXT: no c/c/e     Assessment and plan: 63 yo male with chronic back/leg pain, , HTN, COPD, remote h.o cancer in left eye who presented with severe back pain and found to have likely widely metastatic carcinoma.   1. Code Status: Full   2. Illness Understanding/GOC- See initial consult note. Path with poorly differentiated adenocarcinoma. Awaiting repeat Bx results.  Have broached subject of long-term goals but has been difficult to discuss with uncertainty about type of cancer and treatment options, ongoing pain.  Will need follow-up to discuss.    3. Symptom Mangement  A. Cancer related pain- Used less dilaudid past 24h. Ambulated halls today.  With his PCA, he used 42mg  of IV dilaudid in past 24h (equals ~ 560mg  of oral oxycodone). His basal alone is ~380mg  of oxycodone equivalent. Will go ahead and rotate off PCA to oral long and short acting regimen with some dose reduction for incomplete cross tolerance. - Taking above into account, will d/c PCA and start long acting oxycontin 120mg  TID (dose of 360mg /day which will be less than he received from PCA) - Using principle of breakthrough dose being 10-20% of TDD long acting, will give PRN oxycodone 40-60mg  PRN q3h breakthrough pain - In case we undershot (not naive to oxycontin and ICT may not be as prominent) will also give dilaudid 2mg  IV PRN dose which is  fairly conservative given above.   - If he can tolerate regimen without significant IV use, would be safe to d/c from pain standpoint as soon as tomorrow afternoon.  B. Wt Loss- Started Remeron. Appetite doing much better.  C. Insomnia-doing very well with remeron D. Anxiety/Depression- on ativan/paxil at home. Started remeron as above. Monitor. D/C'd ambien to avoid additive effects of sedative meds. Talked through importance of not using Pain meds to treat anxiety.   4. Psychosocial/Spiritual: Married for >40 years to his wife Nicholas Soto. They have 3 biological children and 2 adopted children. Most family lives near [place] area. Retired from [employer].   Doran Clay D.O. Palliative Medicine Team at [facility]  Pager: [phone] Team Phone: [phone]

## 2014-07-23 NOTE — Progress Notes (Addendum)
Progress Note   Nicholas Soto PYK:[id number] DOB: 02/22/1951 DOA: 07/13/2014 PCP: [name], MD   Brief Narrative:   Nicholas Soto is an 63 y.o. 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 Pt was found to have diffuse metastatic disease to his spine upon initial evaluation. CT abdomen/ pelvis showed widespread disease in multiple lymph node regions and diffuse lytic bone mets. Radiation oncology was consulted due to patient's complaints of severe pain in right groin area. He started RT 07/16/14.  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 Status post CT-guided tissue biopsy 07/22/14 for molecular profiling.   Started radiation therapy 07/16/2014. 30 grays planned. Will receive treatment #6/10 today.  Palliative care team following for assistance with pain management. Plan is to wean PCA.   With regard to shoulder pain patient had X rays done of both shoulders with no evidence of disease in those areas.   Continue adjuvant therapy with Lyrica 75 mg by mouth twice a day, ativan 1 mg PO BID and Decadron 4 mg every 6 hours.   Dr. [name] of oncology following with ongoing workup. So far kappa/lambda light chains are unremarkable.  No monoclonal free light chains/Bence  Jones protein detected on urine IFE. No M spike on serum protein electrophoresis.   Active Problems:  TOBACCO ABUSE   Nicotine patch ordered. Tobacco cessation counseling per nursing staff.  Anemia of chronic disease   Likely due to malignancy.  No signs of active bleeding. Hemoglobin stable.  Chronic obstructive pulmonary disease / Acute respiratory failure with hypoxia   Continue Symbicort, Spiriva and xopenex when necessary.   Anxiety and depression   Continue Ativan 1 mg by mouth twice a day, Paxil 10 mg in the morning.  Unspecified hereditary and idiopathic peripheral neuropathy   Burning pain in legs improved. Continue lyrica 75 mg PO BID.  Functional quadriplegia   Limited mobility secondary to severe pain from bone metastases.   Will likely need SNF placement.  Loss of weight / severe protein calorie malnutrition   Weight loss felt to be secondary to cancer related cachexia. Dietitian consulted. Continue ensure supplementation.   DVT prophylaxis   Lovenox ordered while inpatient. No signs of bleeding.   Code Status: Full. Family Communication: Wife updated at bedside. Disposition Plan: SNF when stable.   IV Access:    Peripheral IV   Procedures and diagnostic studies:    MRI lumbar spine 07/13/14:1. Unfortunately there are enhancing tumors in most of the lumbar vertebra, the sacrum, paraspinal tissues, subcutaneous tissues, retroperitoneum, and perirenal spaces most compatible with a diffuse metastatic process. Oncology referral with staging workup to determine a suitable biopsy site recommended. 2. Cortical breakthrough from the L3 vertebral body on the right side leads to epidural tumor and tumor in the right neural foramen at L2-3. There is also involvement of the S1 and S2 neural foramina  by tumor and sacral epidural tumor as noted above. 3. Spondylosis and degenerative disc disease also cause moderate impingement at L3-4 and mild impingement at T11-12  and L4-5, as noted above.   CT of the chest, abdomen and pelvis 07/13/14: Bulky mediastinal lymphadenopathy with mild lymphadenopathy also seen in the right axilla, bilateral pericardial spaces, and retrocrural regions, consistent metastatic disease. Diffuse body wall soft tissue metastases throughout the chest, abdomen, and pelvis. Diffuse mesenteric and retroperitoneal metastases throughout the abdomen and pelvis. Diffuse lytic bone metastases. Probable small splenic metastases.   CT Biopsy 07/15/2014 CT-guided core biopsy of right retroperitoneal soft tissue mass.   Left shoulder films 07/17/14: No acute bone abnormality to the left shoulder. Cannot exclude underlying rotator cuff disease. Evidence for carotid artery calcifications.  Right shoulder films 07/17/14: No acute bone abnormality to the right shoulder. Mediastinal fullness consistent with known mediastinal lymphadenopathy.  CT biopsy 07/22/14: CT core right retroperitoneal mass [id number]. No complications. No blood loss.  Medical Consultants:    Dr. Charlett Blake, Physical Medicine and Rehabilitation  Dr. Isaac Laud, Palliative Care  Dr. Concha Norway, Oncology  Dr. Jacqulynn Cadet, IR   Other Consultants:    Physical therapy  Dietitian   Anti-Infectives:    None.  Subjective:   Nicholas Soto tells me his appetite is good and feels like he is gaining weight. Still having right hip pain which is exacerbated when he has to move around to have his radiation treatment. No nausea or vomiting. Bowels are moving.  Objective:    Filed Vitals:   07/23/14 0324 07/23/14 0647 07/23/14 0743 07/23/14 0756  BP:  127/56    Pulse:  76    Temp:  97.7 F (36.5 C)    TempSrc:  Oral    Resp: 12 16 16    Height:      Weight:      SpO2: 97% 99% 93% 95%    Intake/Output Summary (Last 24 hours) at 07/23/14 0808 Last data filed at 07/23/14 0644  Gross per 24 hour  Intake 748.66 ml  Output   1650 ml  Net  -901.34 ml    Exam: Gen:  Awake, alert, in good spirits Cardiovascular:  RRR, No M/R/G Respiratory:  Lungs clear Gastrointestinal:  Abdomen soft, NT/ND, + BS Extremities:  No C/E/C   Data Reviewed:    Labs: Basic Metabolic Panel:  Recent Labs Lab 07/17/14 0458 07/20/14 0349 07/21/14 0500  NA 137  --  137  K 4.2  --  4.6  CL 98  --  95*  CO2 27  --  31  GLUCOSE 142*  --  139*  BUN 27*  --  25*  CREATININE 0.73 0.76 0.64  CALCIUM 8.9  --  8.9   GFR Estimated Creatinine Clearance: 93.3 ml/min (by C-G formula based on Cr of 0.64).  CBC:  Recent Labs Lab 07/17/14 0458 07/21/14 0500 07/22/14 0405  WBC 11.2* 12.8* 11.9*  HGB 12.1* 11.6* 11.5*  HCT 35.7* 35.7* 34.9*  MCV 88.4 90.8 91.1  PLT 297 273 257   Microbiology No results found for this or any previous visit (from the past 240 hour(s)).   Medications:   . antiseptic oral rinse  15 mL Mouth Rinse BID  . budesonide-formoterol  2 puff Inhalation QHS  . dexamethasone  4 mg Oral 4 times per day  . enoxaparin (LOVENOX) injection  40 mg Subcutaneous Q24H  . feeding supplement (ENSURE COMPLETE)  237 mL Oral TID BM  .  HYDROmorphone PCA 0.3 mg/mL   Intravenous 6 times per day  . LORazepam  1 mg Oral BID  . mirtazapine  15 mg Oral QHS  . montelukast  10 mg Oral q morning - 10a  . nicotine  21 mg Transdermal Daily  . pantoprazole  40 mg Oral q morning - 10a  . PARoxetine  10 mg Oral q morning - 10a  . pregabalin  75 mg Oral BID  . scopolamine  1 patch Transdermal Q72H  . senna  1 tablet Oral Daily  . tiotropium  18 mcg Inhalation Daily   Continuous Infusions:   Time spent: 25 minutes.   LOS: 10 days   [facility] Pager [phone]. If unable to reach me by pager, please call my cell phone at [phone].  *Please refer to [email or web address], password [email or web address] to get updated schedule on who will round on this patient, as hospitalists switch teams weekly. If 7PM-7AM, please contact night-coverage  at [email or web address], password [email or web address] for any overnight needs.  07/23/2014, 8:08 AM    **Disclaimer: This note was dictated with voice recognition software. Similar sounding words can inadvertently be transcribed and this note may contain transcription errors which may not have been corrected upon publication of note.**

## 2014-07-23 NOTE — Progress Notes (Signed)
[facility] Therapy Treatment Record Phone [phone]   Radiation Therapy was administered to Nicholas Soto on: 07/23/2014  5:02 PM and was treatment # 6 out of a planned course of 10 treatments.

## 2014-07-24 ENCOUNTER — Ambulatory Visit
Admit: 2014-07-24 | Discharge: 2014-07-24 | Disposition: A | Discharge status: Home / Self Care | Payer: 59 | Attending: Radiation Oncology | Admitting: Radiation Oncology

## 2014-07-24 MED ORDER — SCOPOLAMINE 1 MG/3DAYS TD PT72: 1.0000 | MEDICATED_PATCH | TRANSDERMAL | Status: AC

## 2014-07-24 MED ORDER — OXYCODONE HCL ER 60 MG PO T12A: 120.0000 mg | EXTENDED_RELEASE_TABLET | Freq: Three times a day (TID) | ORAL | Status: DC

## 2014-07-24 MED ORDER — LEVALBUTEROL HCL 0.63 MG/3ML IN NEBU: 0.6300 mg | INHALATION_SOLUTION | Freq: Four times a day (QID) | RESPIRATORY_TRACT | Status: AC | PRN

## 2014-07-24 MED ORDER — POLYETHYLENE GLYCOL 3350 17 G PO PACK
17.0000 g | PACK | Freq: Every day | ORAL | Status: AC | PRN
Start: 2014-07-24 — End: ?

## 2014-07-24 MED ORDER — SENNA 8.6 MG PO TABS: 1.0000 | ORAL_TABLET | Freq: Every day | ORAL | Status: AC

## 2014-07-24 MED ORDER — OXYCODONE HCL 20 MG PO TABS: 40.0000 mg | ORAL_TABLET | ORAL | Status: DC | PRN

## 2014-07-24 MED ORDER — ONDANSETRON HCL 4 MG PO TABS: 4.0000 mg | ORAL_TABLET | Freq: Four times a day (QID) | ORAL | Status: AC | PRN

## 2014-07-24 MED ORDER — BIOTENE DRY MOUTH MT LIQD: 15.0000 mL | Freq: Two times a day (BID) | OROMUCOSAL | Status: AC

## 2014-07-24 MED ORDER — DEXAMETHASONE 4 MG PO TABS: 4.0000 mg | ORAL_TABLET | Freq: Four times a day (QID) | ORAL | Status: AC

## 2014-07-24 MED ORDER — MIRTAZAPINE 15 MG PO TABS: 15.0000 mg | ORAL_TABLET | Freq: Every day | ORAL | Status: AC

## 2014-07-24 MED ORDER — LORAZEPAM 0.5 MG PO TABS
1.0000 mg | ORAL_TABLET | Freq: Two times a day (BID) | ORAL | Status: DC | PRN
Start: 2014-07-24 — End: 2014-08-02

## 2014-07-24 MED ORDER — OXYCODONE HCL 5 MG PO TABS: 60.0000 mg | ORAL_TABLET | Freq: Once | ORAL | Status: DC

## 2014-07-24 MED ORDER — ENSURE COMPLETE PO LIQD: 237.0000 mL | Freq: Three times a day (TID) | ORAL | Status: AC

## 2014-07-24 MED ORDER — NICOTINE 21 MG/24HR TD PT24: 21.0000 mg | MEDICATED_PATCH | Freq: Every day | TRANSDERMAL | Status: AC

## 2014-07-24 MED ORDER — PREGABALIN 75 MG PO CAPS: 75.0000 mg | ORAL_CAPSULE | Freq: Two times a day (BID) | ORAL | Status: AC

## 2014-07-24 NOTE — Progress Notes (Signed)
Pt for discharge to [facility] today.  [name] met with pt and pt wife this morning to provide support. Pt wife tearful this morning, but coping better following meeting with MD. Pt shared that transition to oral pain medication has been managing his pain and he is eager to discharge to [facility] for rehab in order to return home. Pt coping as well as possible with cancer diagnosis and hopeful to return home soon in order to be with his wife, Karna Christmas.  [name] facilitated pt discharge needs to [facility]hab including contacting facility, faxing pt discharge information via [facility], providing RN phone number to call report, and arranging ambulance transport via [facility].   Pt and pt wife aware of transport to [facility].  No further social work needs identified at this time.  [name] signing off.   [name], MSW, [place] [facility] [phone]

## 2014-07-24 NOTE — Progress Notes (Signed)
[facility] Therapy Treatment Record Phone [phone]   Radiation Therapy was administered to Nicholas Soto on: 07/24/2014  10:27 AM and was treatment # 7 out of a planned course of 10 treatments.

## 2014-07-24 NOTE — Discharge Instructions (Signed)
Metastatic Cancer, Questions and Answers KEY POINTS  Cancer happens when cells become abnormal and grow without control.  Where the cancer started is called the primary cancer or the primary tumor.  Metastatic cancer happens when cancer cells spread from the place where it started to other parts of the body.  When cancer spreads, the metastatic cancer keeps the same type of cells and the same name as the primary tumor.  The most common sites of metastasis are the lungs, bones, liver, and brain.  Treatment for metastatic cancer usually depends on the type of cancer. It also depends on the size and location of the metastasis. WHAT IS CANCER?   Cancer is a group of many related diseases. All cancers begin in cells. Cells are the building blocks that make up tissues. Cancer that arises from organs and solid tissues is called a solid tumor. Cancer that begins in blood cells is called leukemia, multiple myeloma, or lymphoma.  Normally, cells grow and divide to form new cells as the body needs them. When cells grow old and die, new cells take their place. Sometimes this orderly process goes wrong. New cells form when the body does not need them. Old cells do not die when they should.  The extra cells form a mass of tissue. This is called a growth or tumor. Tumors can be either not cancerous (benign) or cancerous (malignant). Benign tumors do not spread to other parts of the body. They are rarely a threat to life. Malignant tumors can spread (metastasize) and may be life threatening. WHAT IS PRIMARY CANCER?  Cancer can begin in any organ or tissue of the body. The original tumor is called the primary cancer or primary tumor. It is usually named for the part of the body or the type of cell in which it begins. WHAT IS METASTASIS, AND HOW DOES IT HAPPEN?   Metastasis means the spread of cancer. Cancer cells can break away from a primary tumor and enter the bloodstream or lymphatic system. This is the  system that produces, stores, and carries the cells that fight infections. That is how cancer cells spread to other parts of the body.  When cancer cells spread and form a new tumor in a different organ, the new tumor is a metastatic tumor. The cells in the metastatic tumor come from the original tumor. For example, if breast cancer spreads to the lungs, the metastatic tumor in the lung is made up of cancerous breast cells. It is not made of lung cells. In this case, the disease in the lungs is metastatic breast cancer (not lung cancer). Under a microscope, metastatic breast cancer cells generally look the same as the cancer cells in the breast. [place]?   Cancer cells can spread to almost any part of the body. Cancer cells frequently spread to lymph nodes (rounded masses of lymphatic tissue) near the primary tumor (regional lymph nodes). This is called lymph node involvement or regional disease. Cancer that spreads to other organs or to lymph nodes far from the primary tumor is called metastatic disease. Caregivers sometimes also call this distant disease.  The most common sites of metastasis from solid tumors are the lungs, bones, liver, and brain. Some cancers tend to spread to certain parts of the body. For example, lung cancer often metastasizes to the brain or bones. Colon cancer often spreads to the liver. Prostate cancer tends to spread to the bones. Breast cancer commonly spreads to the bones, lungs, liver,  the liver. Prostate cancer tends to spread to the bones. Breast cancer commonly spreads to the bones, lungs, liver, or brain. But each of these cancers can spread to other parts of the body as well.  · Because blood cells travel throughout the body, leukemia, multiple myeloma, and lymphoma cells are usually not localized when the cancer is diagnosed. Tumor cells may be found in the blood, several lymph nodes, or other parts of the body such as the liver or bones. This type of spread is not referred to as metastasis.  ARE THERE SYMPTOMS OF METASTATIC CANCER?   · Some people with metastatic  cancer do not have symptoms. Their metastases are found by X-rays and other tests performed for other reasons.  · When symptoms of metastatic cancer occur, the type and frequency of the symptoms will depend on the size and location of the metastasis. For example, cancer that spreads to the bones is likely to cause pain and can lead to bone fractures. Cancer that spreads to the brain can cause a variety of symptoms. These include headaches, seizures, and unsteadiness. Shortness of breath may be a sign of lung involvement. Abdominal swelling or yellowing of the skin (jaundice) can indicate that cancer has spread to the liver.  · Sometimes a person's primary cancer is discovered only after the metastatic tumor causes symptoms. For example, a man whose prostate cancer has spread to the bones in his pelvis may have lower back pain (caused by the cancer in his bones) before he experiences any symptoms from the primary tumor in his prostate.  HOW DOES THE CAREGIVER KNOW WHETHER A CANCER IS PRIMARY OR A METASTATIC TUMOR?  · To determine whether a tumor is primary or metastatic, the tumor will be examined under a microscope. In general, cancer cells look like abnormal versions of cells in the tissue where the cancer began. Using specialized diagnostic tests, a trained person is often able to tell where the cancer cells came from. Markers or antigens found in or on the cancer cells can indicate the primary site of the cancer.  · Metastatic cancers may be found before or at the same time as the primary tumor, or months or years later. When a new tumor is found in a patient who has been treated for cancer in the past, it is more often a metastasis than another primary tumor.  IS IT POSSIBLE TO HAVE A METASTATIC TUMOR WITHOUT HAVING A PRIMARY CANCER?   No. A metastatic tumor always starts from cancer cells in another part of the body. In most cases, when a metastatic tumor is found first, the primary tumor can be found. The  search for the primary tumor may involve lab tests, X-rays, and other procedures. However, in a small number of cases, a metastatic tumor is diagnosed but the primary tumor cannot be found, in spite of extensive tests. The tumor is metastatic because the cells are not like those in the organ or tissue in which the tumor is found. The primary tumor is called unknown or hidden (occult). The patient is said to have cancer of unknown primary origin (CUP). Because diagnostic techniques are constantly improving, the number of cases of CUP is going down.   WHAT TREATMENTS ARE USED FOR METASTATIC CANCER?   · When cancer has metastasized, it may be treated with:  ¨ Chemotherapy.  ¨ Radiation therapy.  ¨ Biological therapy.  ¨ Hormone therapy.  ¨ Surgery.  ¨ Cryosurgery.  ¨ A combination of these.  ·   The choice of treatment generally depends on the:  ¨ Type of primary cancer.  ¨ Size and location of the metastasis.  ¨ Patient's age and general health.  ¨ Types of treatments the patient has had in the past.  In patients with CUP, it is possible to treat the disease even though the primary tumor has not been located. The goal of treatment may be to control the cancer, or to relieve symptoms or side effects of treatment.  ARE NEW TREATMENTS FOR METASTATIC CANCER BEING DEVELOPED?   Yes, many new cancer treatments are under study. To develop new treatments, the NCI sponsors clinical trials (research studies) with cancer patients in many hospitals, universities, medical schools, and cancer centers around the country. Clinical trials are a critical step in the improvement of treatment. Before any new treatment can be recommended for general use, doctors conduct studies to find out whether the treatment is both safe for patients and effective against the disease. The results of such studies have led to progress not only in the treatment of cancer, but in the detection, diagnosis, and prevention of the disease as well. Patients  interested in taking part in a clinical trial should talk with their caregivers.  FOR MORE INFORMATION  National Cancer Institute (NCI): www.cancer.gov  Document Released: 04/19/2005 Document Revised: 03/06/2012 Document Reviewed: 12/05/2008  Exit[employer]® Patient Information ©2015 [employer]. This information is not intended to replace advice given to you by your health care provider. Make sure you discuss any questions you have with your health care provider.

## 2014-07-24 NOTE — Discharge Summary (Signed)
Physician Discharge Summary  Nicholas Soto MCN:[id number] DOB: 1951-03-13 DOA: 07/13/2014  PCP: [name], MD  Admit date: 07/13/2014 Discharge date: 07/24/2014   Recommendations for Outpatient Follow-Up:   1. The patient is being discharged to a SNF for further rehabilitation.   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  Diet recommendation: Low sodium, heart healthy.    History of Present Illness:   Nicholas Soto is an 63 y.o. 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 Pt was found to have diffuse metastatic disease to his spine upon initial evaluation. CT abdomen/ pelvis showed widespread disease in multiple lymph node regions and diffuse lytic bone mets. Radiation oncology was consulted due to patient's complaints of severe pain in right groin area. He started RT 07/16/14. 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 Status post repeat CT-guided tissue biopsy 07/22/14, sent for molecular profiling. Followup EGFR (gefitinib, afatinib,erlotinib ) and ALK (crizotinib) and ROS1 (crizotinb) testing.  Started radiation therapy 07/16/2014. 30 grays planned. Will receive treatment #7/10 today.   Palliative care team following for assistance with pain management. PCA weaned with discharge pain management consisting of OxyContin/oxycodone. Adjuvant therapy with Lyrica, Decadron and Ativan.   With regard to shoulder pain patient had X rays done of both shoulders with no evidence of disease in those areas.   Dr. [name] of oncology evaluated the patient and will follow up with him post discharge to initiate treatment. Myeloma ruled out. PSA not elevated   Plan is to start platinum based treatment with Carbo plus a taxane plus/minus bevacizumab wiith completion of his radiation per Dr. [name] notes.  Active Problems:  TOBACCO ABUSE   Nicotine patch ordered. Tobacco cessation counseling provided per nursing staff.  Anemia of chronic disease   Likely due to malignancy. No signs of active bleeding. Hemoglobin stable.  Chronic obstructive pulmonary disease / Acute respiratory failure with hypoxia   Continue Symbicort, Spiriva and xopenex when necessary.   Anxiety and depression   Continue Ativan 1 mg by mouth twice a day, Paxil 10 mg in the morning.  Unspecified hereditary and idiopathic peripheral neuropathy   Burning pain in legs improved. Continue lyrica 75 mg PO BID.  Functional quadriplegia   Limited mobility secondary to severe pain from bone metastases.   SNF placement planned.  Loss of weight / severe protein calorie malnutrition   Weight loss felt to be secondary to cancer related cachexia. Dietitian consulted. Continue ensure supplementation.  DVT prophylaxis   Lovenox ordered while inpatient. No signs of bleeding.    Medical Consultants:     Dr. Charlett Blake, Physical Medicine and Rehabilitation   Dr. Isaac Laud, Palliative Care   Dr. Concha Norway, Oncology   Dr. Jacqulynn Cadet, IR   Discharge Exam:   Filed Vitals:   07/24/14 0624  BP: 140/66  Pulse: 68  Temp: 97.1 F (36.2 C)  Resp: 16   Filed Vitals:   07/23/14 1400 07/23/14 2007 07/23/14 2231 07/24/14 0624  BP: 128/60  122/59 140/66  Pulse: 90  90 68  Temp: 97.9 F (36.6 C)  98.2 F (36.8 C) 97.1 F (36.2 C)  TempSrc: Oral  Axillary Oral  Resp: _0 Height:      Weight:      SpO2: 95% 96% 96% 95%    Gen:  NAD Cardiovascular:  RRR, No M/R/G Respiratory: Lungs CTAB Gastrointestinal: Abdomen soft, NT/ND with normal active bowel sounds. Extremities: No C/E/C   The results of significant diagnostics from this hospitalization (including imaging, microbiology, ancillary and laboratory) are listed below for reference.     Procedures and Diagnostic Studies:    MRI lumbar spine 07/13/14:1. Unfortunately there are enhancing tumors in most of the lumbar vertebra, the sacrum, paraspinal tissues, subcutaneous tissues, retroperitoneum, and perirenal spaces most compatible with a diffuse metastatic process. Oncology referral with staging workup to determine a suitable biopsy site recommended. 2. Cortical breakthrough from the L3 vertebral body on the right side leads to epidural tumor and tumor in the right neural foramen at L2-3. There is also involvement of the S1 and S2 neural foramina by tumor and sacral epidural tumor as noted above. 3. Spondylosis and degenerative disc disease also cause moderate impingement at L3-4 and mild impingement at T11-12 and L4-5, as noted above.   CT of the chest, abdomen and pelvis 07/13/14: Bulky mediastinal lymphadenopathy with mild lymphadenopathy also seen in the right axilla, bilateral pericardial spaces, and retrocrural regions, consistent metastatic disease. Diffuse body wall soft tissue metastases  throughout the chest, abdomen, and pelvis. Diffuse mesenteric and retroperitoneal metastases throughout the abdomen and pelvis. Diffuse lytic bone metastases. Probable small splenic metastases.   CT Biopsy 07/15/2014 CT-guided core biopsy of right retroperitoneal soft tissue mass.   Left shoulder films 07/17/14: No acute bone abnormality to the left shoulder. Cannot exclude underlying rotator cuff disease. Evidence for carotid artery calcifications.   Right shoulder films 07/17/14: No acute bone abnormality to the right shoulder. Mediastinal fullness consistent with known mediastinal lymphadenopathy.   CT biopsy 07/22/14: CT core right retroperitoneal mass [id number]. No complications. No blood loss.   Labs:   Basic Metabolic Panel:  Recent Labs Lab 07/20/14 0349 07/21/14 0500  NA  --  137  K  --  4.6  CL  --  95*  CO2  --  31  GLUCOSE  --  139*  BUN  --  25*  CREATININE 0.76 0.64  CALCIUM  --  8.9   GFR Estimated Creatinine Clearance: 93.3 ml/min (by C-G formula based on Cr of 0.64).  CBC:  Recent Labs Lab 07/21/14 0500 07/22/14 0405  WBC 12.8* 11.9*  HGB 11.6* 11.5*  HCT 35.7* 34.9*  MCV 90.8 91.1  PLT 273 257    Discharge Instructions:   Discharge Instructions   Call MD for:  extreme fatigue    Complete by:  As directed      Call MD for:  persistant nausea and vomiting  Complete by:  As directed      Call MD for:  severe uncontrolled pain    Complete by:  As directed      Call MD for:    Complete by:  As directed   Loss of sensation or power in your muscles.     Diet - low sodium heart healthy    Complete by:  As directed      Discharge instructions    Complete by:  As directed   You were cared for by Dr. Jacquelynn Cree  (a hospitalist) during your hospital stay. If you have any questions about your discharge medications or the care you received while you were in the hospital after you are discharged, you can call the unit and ask to speak with the hospitalist  on call if the hospitalist that took care of you is not available. Once you are discharged, your primary care physician will handle any further medical issues. Please note that NO REFILLS for any discharge medications will be authorized once you are discharged, as it is imperative that you return to your primary care physician (or establish a relationship with a primary care physician if you do not have one) for your aftercare needs so that they can reassess your need for medications and monitor your lab values.  Any outstanding tests can be reviewed by your PCP at your follow up visit.  It is also important to review any medicine changes with your PCP.  Please bring these d/c instructions with you to your next visit so your physician can review these changes with you.     Increase activity slowly    Complete by:  As directed      Walk with assistance    Complete by:  As directed      Walker     Complete by:  As directed             Medication List    STOP taking these medications       fentaNYL 75 MCG/HR  Commonly known as:  DURAGESIC - dosed mcg/hr     oxyCODONE 40 MG 12 hr tablet  Commonly known as:  OXYCONTIN  Replaced by:  OxyCODONE HCl ER 60 MG T12a     predniSONE 5 MG tablet  Commonly known as:  DELTASONE      TAKE these medications       Aclidinium Bromide 400 MCG/ACT Aepb  Inhale 1 puff into the lungs 2 (two) times daily.     albuterol 108 (90 BASE) MCG/ACT inhaler  Commonly known as:  PROVENTIL HFA;VENTOLIN HFA  Inhale 2 puffs into the lungs every 6 (six) hours as needed for wheezing or shortness of breath.     antiseptic oral rinse Liqd  15 mLs by Mouth Rinse route 2 (two) times daily.     budesonide-formoterol 160-4.5 MCG/ACT inhaler  Commonly known as:  SYMBICORT  Inhale 2 puffs into the lungs at bedtime.     dexamethasone 4 MG tablet  Commonly known as:  DECADRON  Take 1 tablet (4 mg total) by mouth every 6 (six) hours.     diphenhydrAMINE 25 MG tablet    Commonly known as:  SOMINEX  Take 25 mg by mouth at bedtime as needed for sleep.     feeding supplement (ENSURE COMPLETE) Liqd  Take 237 mLs by mouth 3 (three) times daily between meals.     ibuprofen 200 MG tablet  Commonly known as:  ADVIL,MOTRIN  Take 400 mg by mouth every 6 (six) hours as needed for moderate pain.     levalbuterol 0.63 MG/3ML nebulizer solution  Commonly known as:  XOPENEX  Take 3 mLs (0.63 mg total) by nebulization every 6 (six) hours as needed for wheezing or shortness of breath.     LORazepam 0.5 MG tablet  Commonly known as:  ATIVAN  Take 2 tablets (1 mg total) by mouth 2 (two) times daily as needed for anxiety.     mirtazapine 15 MG tablet  Commonly known as:  REMERON  Take 1 tablet (15 mg total) by mouth at bedtime.     montelukast 10 MG tablet  Commonly known as:  SINGULAIR  Take 10 mg by mouth every morning.     nicotine 21 mg/24hr patch  Commonly known as:  NICODERM CQ - dosed in mg/24 hours  Place 1 patch (21 mg total) onto the skin daily.     omeprazole 20 MG capsule  Commonly known as:  PRILOSEC  Take 20 mg by mouth every morning.     ondansetron 4 MG tablet  Commonly known as:  ZOFRAN  Take 1 tablet (4 mg total) by mouth every 6 (six) hours as needed for nausea.     Oxycodone HCl 20 MG Tabs  Take 2-3 tablets (40-60 mg total) by mouth every 3 (three) hours as needed for moderate pain, severe pain or breakthrough pain.     OxyCODONE HCl ER 60 MG T12a  Take 120 mg by mouth every 8 (eight) hours.     pantoprazole 40 MG tablet  Commonly known as:  PROTONIX  Take 40 mg by mouth every morning.     PARoxetine 10 MG tablet  Commonly known as:  PAXIL  Take 10 mg by mouth every morning.     polyethylene glycol packet  Commonly known as:  MIRALAX / GLYCOLAX  Take 17 g by mouth daily as needed for mild constipation.     pregabalin 75 MG capsule  Commonly known as:  LYRICA  Take 1 capsule (75 mg total) by mouth 2 (two) times daily.      promethazine 25 MG tablet  Commonly known as:  PHENERGAN  Take 25 mg by mouth every 8 (eight) hours as needed for nausea or vomiting.     scopolamine 1 MG/3DAYS  Commonly known as:  TRANSDERM-SCOP  Place 1 patch (1.5 mg total) onto the skin every 3 (three) days.     senna 8.6 MG Tabs tablet  Commonly known as:  SENOKOT  Take 1 tablet (8.6 mg total) by mouth daily.          Time coordinating discharge: 35 minutes.  Signed:  Nicoya Friel  Pager [phone] [facility] 07/24/2014, 11:23 AM

## 2014-07-25 ENCOUNTER — Ambulatory Visit
Admit: 2014-07-25 | Discharge: 2014-07-25 | Disposition: A | Discharge status: Home / Self Care | Payer: 59 | Attending: Radiation Oncology | Admitting: Radiation Oncology

## 2014-07-25 VITALS — BP 108/66 | HR 85 | Temp 98.2°F

## 2014-07-25 DIAGNOSIS — C7951 Secondary malignant neoplasm of bone: Secondary | ICD-10-CM

## 2014-07-25 DIAGNOSIS — C801 Malignant (primary) neoplasm, unspecified: Secondary | ICD-10-CM | POA: Diagnosis not present

## 2014-07-25 DIAGNOSIS — Z510 Encounter for antineoplastic radiation therapy: Secondary | ICD-10-CM | POA: Diagnosis present

## 2014-07-25 NOTE — Progress Notes (Signed)
  Radiation Oncology         ([phone]) [phone] ________________________________  Name: Nicholas Soto MRN: [id number]  Date: 07/25/2014  DOB: 10/11/1951  Weekly Radiation Therapy Management   Current Dose: 24 Gy     Planned Dose:  30 Gy  Narrative . . . . . . . . The patient presents for routine under treatment assessment.                                   The patient is without complaint.  Back pain is much better. He is able to ambulate some. He continues have some numbness along his right leg. The patient was discharged from the hospital yesterday and is residing in [place].                                 Set-up films were reviewed.                                 The chart was checked. Physical Findings. . .  temperature is 98.2 F (36.8 C). His blood pressure is 108/66 and his pulse is 85. His oxygen saturation is 95%. . The patient have supplemental oxygen in place at 3 L. The lungs are clear except for upper airway noise. The heart has regular rhythm and rate. Impression . . . . . . . The patient is tolerating radiation. Plan . . . . . . . . . . . . Continue treatment as planned.   ________________________________   Blair Promise, PhD, MD

## 2014-07-26 ENCOUNTER — Ambulatory Visit
Admit: 2014-07-26 | Discharge: 2014-07-26 | Disposition: A | Discharge status: Home / Self Care | Payer: 59 | Attending: Radiation Oncology | Admitting: Radiation Oncology

## 2014-07-26 ENCOUNTER — Telehealth: Payer: Self-pay

## 2014-07-26 DIAGNOSIS — Z510 Encounter for antineoplastic radiation therapy: Secondary | ICD-10-CM | POA: Diagnosis not present

## 2014-07-26 NOTE — Care Management Note (Addendum)
    Page 1 of 1   07/18/2014     8:02:44 AM CARE MANAGEMENT NOTE 07/18/2014  Patient:  Nicholas Soto, Nicholas Soto   Account Number:  [id number]  Date Initiated:  07/17/2014  Documentation initiated by:  [facility]  Subjective/Objective Assessment:   [id number] CA with bony mets     Action/Plan:   CIR   Anticipated DC Date:  07/19/2014   Anticipated DC Plan:           Choice offered to / List presented to:             Status of service:   Medicare Important Message given?   (If response is "NO", the following Medicare IM given date fields will be blank) Date Medicare IM given:   Medicare IM given by:   Date Additional Medicare IM given:   Additional Medicare IM given by:    Discharge Disposition:  IP REHAB FACILITY  Per UR Regulation:    If discussed at Long Length of Stay Meetings, dates discussed:    Comments:  07/17/14 12:00 CM placed call to CIR and spoek with Nicholas Soto to arrange for CIR.  Nicholas Soto states she will begin process and wait for PT eval.  No other CM needs were communicated. Nicholas Soto, [phone].

## 2014-07-26 NOTE — Telephone Encounter (Signed)
Spoke with wife who is at [employer] now that appointment has been changed to 2:00 pm today and if transportation can get him here for treatment at that time.She said she will let them know and that it is possible she may take him home but will make sure he gets here at 2:00 pm.

## 2014-07-29 ENCOUNTER — Inpatient Hospital Stay (HOSPITAL_COMMUNITY)
Admission: EM | Admit: 2014-07-29 | Discharge: 2014-08-02 | DRG: 542 | Disposition: A | Discharge status: Home Health | Payer: 59 | Attending: Internal Medicine | Admitting: Internal Medicine

## 2014-07-29 ENCOUNTER — Other Ambulatory Visit: Payer: Self-pay

## 2014-07-29 ENCOUNTER — Encounter: Payer: Self-pay | Admitting: Radiation Oncology

## 2014-07-29 ENCOUNTER — Ambulatory Visit
Admission: RE | Admit: 2014-07-29 | Discharge: 2014-07-29 | Disposition: A | Discharge status: Home / Self Care | Payer: 59 | Source: Ambulatory Visit | Attending: Radiation Oncology | Admitting: Radiation Oncology

## 2014-07-29 ENCOUNTER — Emergency Department (HOSPITAL_COMMUNITY): Payer: 59

## 2014-07-29 ENCOUNTER — Encounter (HOSPITAL_COMMUNITY): Payer: Self-pay | Admitting: Emergency Medicine

## 2014-07-29 ENCOUNTER — Ambulatory Visit
Admit: 2014-07-29 | Discharge: 2014-07-29 | Disposition: A | Discharge status: Home / Self Care | Payer: 59 | Attending: Radiation Oncology | Admitting: Radiation Oncology

## 2014-07-29 VITALS — BP 121/60 | HR 107 | Temp 98.1°F

## 2014-07-29 DIAGNOSIS — C7952 Secondary malignant neoplasm of bone marrow: Principal | ICD-10-CM

## 2014-07-29 DIAGNOSIS — C7951 Secondary malignant neoplasm of bone: Principal | ICD-10-CM | POA: Diagnosis present

## 2014-07-29 DIAGNOSIS — Z9981 Dependence on supplemental oxygen: Secondary | ICD-10-CM

## 2014-07-29 DIAGNOSIS — D492 Neoplasm of unspecified behavior of bone, soft tissue, and skin: Secondary | ICD-10-CM

## 2014-07-29 DIAGNOSIS — M549 Dorsalgia, unspecified: Secondary | ICD-10-CM

## 2014-07-29 DIAGNOSIS — F411 Generalized anxiety disorder: Secondary | ICD-10-CM | POA: Diagnosis present

## 2014-07-29 DIAGNOSIS — J189 Pneumonia, unspecified organism: Secondary | ICD-10-CM | POA: Diagnosis present

## 2014-07-29 DIAGNOSIS — Z833 Family history of diabetes mellitus: Secondary | ICD-10-CM

## 2014-07-29 DIAGNOSIS — G609 Hereditary and idiopathic neuropathy, unspecified: Secondary | ICD-10-CM

## 2014-07-29 DIAGNOSIS — C801 Malignant (primary) neoplasm, unspecified: Secondary | ICD-10-CM | POA: Diagnosis present

## 2014-07-29 DIAGNOSIS — F329 Major depressive disorder, single episode, unspecified: Secondary | ICD-10-CM | POA: Diagnosis present

## 2014-07-29 DIAGNOSIS — E785 Hyperlipidemia, unspecified: Secondary | ICD-10-CM | POA: Diagnosis present

## 2014-07-29 DIAGNOSIS — Z820 Family history of epilepsy and other diseases of the nervous system: Secondary | ICD-10-CM | POA: Diagnosis not present

## 2014-07-29 DIAGNOSIS — D630 Anemia in neoplastic disease: Secondary | ICD-10-CM | POA: Diagnosis present

## 2014-07-29 DIAGNOSIS — J984 Other disorders of lung: Secondary | ICD-10-CM

## 2014-07-29 DIAGNOSIS — J441 Chronic obstructive pulmonary disease with (acute) exacerbation: Secondary | ICD-10-CM | POA: Diagnosis present

## 2014-07-29 DIAGNOSIS — Z8249 Family history of ischemic heart disease and other diseases of the circulatory system: Secondary | ICD-10-CM | POA: Diagnosis not present

## 2014-07-29 DIAGNOSIS — Z923 Personal history of irradiation: Secondary | ICD-10-CM

## 2014-07-29 DIAGNOSIS — G893 Neoplasm related pain (acute) (chronic): Secondary | ICD-10-CM | POA: Diagnosis present

## 2014-07-29 DIAGNOSIS — G608 Other hereditary and idiopathic neuropathies: Secondary | ICD-10-CM | POA: Diagnosis present

## 2014-07-29 DIAGNOSIS — Z8489 Family history of other specified conditions: Secondary | ICD-10-CM | POA: Diagnosis not present

## 2014-07-29 DIAGNOSIS — Z85840 Personal history of malignant neoplasm of eye: Secondary | ICD-10-CM

## 2014-07-29 DIAGNOSIS — J9600 Acute respiratory failure, unspecified whether with hypoxia or hypercapnia: Secondary | ICD-10-CM | POA: Diagnosis present

## 2014-07-29 DIAGNOSIS — R5383 Other fatigue: Secondary | ICD-10-CM | POA: Diagnosis not present

## 2014-07-29 DIAGNOSIS — E871 Hypo-osmolality and hyponatremia: Secondary | ICD-10-CM | POA: Diagnosis present

## 2014-07-29 DIAGNOSIS — M109 Gout, unspecified: Secondary | ICD-10-CM | POA: Diagnosis present

## 2014-07-29 DIAGNOSIS — Z79899 Other long term (current) drug therapy: Secondary | ICD-10-CM

## 2014-07-29 DIAGNOSIS — G8929 Other chronic pain: Secondary | ICD-10-CM | POA: Diagnosis present

## 2014-07-29 DIAGNOSIS — F17200 Nicotine dependence, unspecified, uncomplicated: Secondary | ICD-10-CM

## 2014-07-29 DIAGNOSIS — R532 Functional quadriplegia: Secondary | ICD-10-CM | POA: Diagnosis present

## 2014-07-29 DIAGNOSIS — E43 Unspecified severe protein-calorie malnutrition: Secondary | ICD-10-CM | POA: Diagnosis present

## 2014-07-29 DIAGNOSIS — Z510 Encounter for antineoplastic radiation therapy: Secondary | ICD-10-CM | POA: Diagnosis present

## 2014-07-29 DIAGNOSIS — I10 Essential (primary) hypertension: Secondary | ICD-10-CM | POA: Diagnosis present

## 2014-07-29 DIAGNOSIS — IMO0002 Reserved for concepts with insufficient information to code with codable children: Secondary | ICD-10-CM

## 2014-07-29 DIAGNOSIS — E860 Dehydration: Secondary | ICD-10-CM

## 2014-07-29 DIAGNOSIS — F3289 Other specified depressive episodes: Secondary | ICD-10-CM | POA: Diagnosis present

## 2014-07-29 DIAGNOSIS — R5381 Other malaise: Secondary | ICD-10-CM

## 2014-07-29 DIAGNOSIS — J9601 Acute respiratory failure with hypoxia: Secondary | ICD-10-CM

## 2014-07-29 LAB — CBC
HCT: 38.1 % — ABNORMAL LOW (ref 39.0–52.0)
HEMOGLOBIN: 12.9 g/dL — AB (ref 13.0–17.0)
MCH: 30.1 pg (ref 26.0–34.0)
MCHC: 33.9 g/dL (ref 30.0–36.0)
MCV: 89 fL (ref 78.0–100.0)
Platelets: 230 10*3/uL (ref 150–400)
RBC: 4.28 MIL/uL (ref 4.22–5.81)
RDW: 13.9 % (ref 11.5–15.5)
WBC: 6.2 10*3/uL (ref 4.0–10.5)

## 2014-07-29 LAB — URINALYSIS, ROUTINE W REFLEX MICROSCOPIC
BILIRUBIN URINE: NEGATIVE
GLUCOSE, UA: NEGATIVE mg/dL
Hgb urine dipstick: NEGATIVE
Ketones, ur: NEGATIVE mg/dL
Leukocytes, UA: NEGATIVE
Nitrite: NEGATIVE
Protein, ur: NEGATIVE mg/dL
SPECIFIC GRAVITY, URINE: 1.027 (ref 1.005–1.030)
UROBILINOGEN UA: 1 mg/dL (ref 0.0–1.0)
pH: 5.5 (ref 5.0–8.0)

## 2014-07-29 LAB — TROPONIN I: Troponin I: <0.3 ng/mL (ref <0.30)

## 2014-07-29 LAB — COMPREHENSIVE METABOLIC PANEL
ALK PHOS: 105 U/L (ref 39–117)
ALT: 13 U/L (ref 0–53)
AST: 21 U/L (ref 0–37)
Albumin: 2.8 g/dL — ABNORMAL LOW (ref 3.5–5.2)
Anion gap: 12 (ref 5–15)
BUN: 23 mg/dL (ref 6–23)
CO2: 25 mEq/L (ref 19–32)
Calcium: 8.8 mg/dL (ref 8.4–10.5)
Chloride: 94 mEq/L — ABNORMAL LOW (ref 96–112)
Creatinine, Ser: 0.46 mg/dL — ABNORMAL LOW (ref 0.50–1.35)
GFR calc Af Amer: >90 mL/min (ref >90)
GFR calc non Af Amer: >90 mL/min (ref >90)
GLUCOSE: 119 mg/dL — AB (ref 70–99)
POTASSIUM: 4.5 meq/L (ref 3.7–5.3)
Sodium: 131 mEq/L — ABNORMAL LOW (ref 137–147)
TOTAL PROTEIN: 6.3 g/dL (ref 6.0–8.3)
Total Bilirubin: 0.8 mg/dL (ref 0.3–1.2)

## 2014-07-29 LAB — I-STAT TROPONIN, ED: TROPONIN I, POC: 0 ng/mL (ref 0.00–0.08)

## 2014-07-29 LAB — TSH: TSH: 0.399 u[IU]/mL (ref 0.350–4.500)

## 2014-07-29 LAB — I-STAT CG4 LACTIC ACID, ED: Lactic Acid, Venous: 1.29 mmol/L (ref 0.5–2.2)

## 2014-07-29 MED ORDER — ACETAMINOPHEN 650 MG RE SUPP: 650.0000 mg | Freq: Four times a day (QID) | RECTAL | Status: DC | PRN

## 2014-07-29 MED ORDER — NITROGLYCERIN 0.4 MG SL SUBL: 0.4000 mg | SUBLINGUAL_TABLET | SUBLINGUAL | Status: DC | PRN

## 2014-07-29 MED ORDER — OXYCODONE-ACETAMINOPHEN 5-325 MG PO TABS
2.0000 | ORAL_TABLET | Freq: Once | ORAL | Status: AC
  Administered 2014-07-29: 2 via ORAL
  Filled 2014-07-29: qty 2

## 2014-07-29 MED ORDER — BIOTENE DRY MOUTH MT LIQD
15.0000 mL | Freq: Two times a day (BID) | OROMUCOSAL | Status: DC
  Administered 2014-07-29 – 2014-08-02 (×8): 15 mL via OROMUCOSAL

## 2014-07-29 MED ORDER — PROMETHAZINE HCL 25 MG PO TABS: 25.0000 mg | ORAL_TABLET | Freq: Three times a day (TID) | ORAL | Status: DC | PRN

## 2014-07-29 MED ORDER — ENSURE COMPLETE PO LIQD
237.0000 mL | Freq: Three times a day (TID) | ORAL | Status: DC
  Administered 2014-07-30 (×2): 237 mL via ORAL

## 2014-07-29 MED ORDER — LEVOFLOXACIN IN D5W 750 MG/150ML IV SOLN
750.0000 mg | INTRAVENOUS | Status: DC
  Administered 2014-07-29 – 2014-07-31 (×3): 750 mg via INTRAVENOUS
  Filled 2014-07-29 (×3): qty 150

## 2014-07-29 MED ORDER — OXYCODONE HCL ER 40 MG PO T12A
120.0000 mg | EXTENDED_RELEASE_TABLET | Freq: Three times a day (TID) | ORAL | Status: DC
Start: 2014-07-29 — End: 2014-08-02
  Administered 2014-07-29 – 2014-08-02 (×12): 120 mg via ORAL
  Filled 2014-07-29 (×11): qty 3

## 2014-07-29 MED ORDER — PREGABALIN 75 MG PO CAPS
75.0000 mg | ORAL_CAPSULE | Freq: Two times a day (BID) | ORAL | Status: DC
  Administered 2014-07-29 – 2014-08-02 (×8): 75 mg via ORAL
  Filled 2014-07-29 (×8): qty 1

## 2014-07-29 MED ORDER — MIRTAZAPINE 15 MG PO TABS
15.0000 mg | ORAL_TABLET | Freq: Every day | ORAL | Status: DC
  Administered 2014-07-29 – 2014-08-01 (×4): 15 mg via ORAL
  Filled 2014-07-29 (×5): qty 1

## 2014-07-29 MED ORDER — LORAZEPAM 2 MG/ML IJ SOLN
1.0000 mg | Freq: Four times a day (QID) | INTRAMUSCULAR | Status: DC | PRN
  Administered 2014-07-29 – 2014-08-01 (×5): 1 mg via INTRAVENOUS
  Filled 2014-07-29 (×4): qty 1

## 2014-07-29 MED ORDER — PAROXETINE HCL 10 MG PO TABS
10.0000 mg | ORAL_TABLET | Freq: Every morning | ORAL | Status: DC
  Administered 2014-07-30 – 2014-08-02 (×4): 10 mg via ORAL
  Filled 2014-07-29 (×5): qty 1

## 2014-07-29 MED ORDER — SENNA 8.6 MG PO TABS
1.0000 | ORAL_TABLET | Freq: Every day | ORAL | Status: DC
  Administered 2014-07-30 – 2014-08-02 (×4): 8.6 mg via ORAL
  Filled 2014-07-29 (×5): qty 1

## 2014-07-29 MED ORDER — LORAZEPAM 1 MG PO TABS: 1.0000 mg | ORAL_TABLET | Freq: Two times a day (BID) | ORAL | Status: DC | PRN

## 2014-07-29 MED ORDER — OXYCODONE HCL 5 MG PO TABS
40.0000 mg | ORAL_TABLET | ORAL | Status: DC | PRN
  Administered 2014-07-29 – 2014-08-02 (×17): 40 mg via ORAL
  Filled 2014-07-29 (×6): qty 8
  Filled 2014-07-29: qty 12
  Filled 2014-07-29 (×10): qty 8

## 2014-07-29 MED ORDER — LEVALBUTEROL HCL 0.63 MG/3ML IN NEBU
0.6300 mg | INHALATION_SOLUTION | Freq: Four times a day (QID) | RESPIRATORY_TRACT | Status: DC
  Administered 2014-07-29: 0.63 mg via RESPIRATORY_TRACT
  Filled 2014-07-29 (×4): qty 3

## 2014-07-29 MED ORDER — MORPHINE SULFATE 4 MG/ML IJ SOLN
4.0000 mg | Freq: Once | INTRAMUSCULAR | Status: AC
  Administered 2014-07-29: 4 mg via INTRAVENOUS
  Filled 2014-07-29: qty 1

## 2014-07-29 MED ORDER — PANTOPRAZOLE SODIUM 40 MG PO TBEC
80.0000 mg | DELAYED_RELEASE_TABLET | Freq: Every day | ORAL | Status: DC
  Administered 2014-07-29 – 2014-08-02 (×5): 80 mg via ORAL
  Filled 2014-07-29 (×6): qty 2

## 2014-07-29 MED ORDER — ENOXAPARIN SODIUM 40 MG/0.4ML ~~LOC~~ SOLN
40.0000 mg | SUBCUTANEOUS | Status: DC
  Administered 2014-07-29 – 2014-08-01 (×5): 40 mg via SUBCUTANEOUS
  Filled 2014-07-29 (×5): qty 0.4

## 2014-07-29 MED ORDER — MONTELUKAST SODIUM 10 MG PO TABS
10.0000 mg | ORAL_TABLET | Freq: Every day | ORAL | Status: DC
  Administered 2014-07-29 – 2014-08-01 (×4): 10 mg via ORAL
  Filled 2014-07-29 (×5): qty 1

## 2014-07-29 MED ORDER — ONDANSETRON HCL 4 MG/2ML IJ SOLN: 4.0000 mg | Freq: Four times a day (QID) | INTRAMUSCULAR | Status: DC | PRN

## 2014-07-29 MED ORDER — DEXAMETHASONE 4 MG PO TABS
4.0000 mg | ORAL_TABLET | Freq: Four times a day (QID) | ORAL | Status: DC
  Administered 2014-07-29 – 2014-08-02 (×15): 4 mg via ORAL
  Filled 2014-07-29 (×19): qty 1

## 2014-07-29 MED ORDER — ONDANSETRON HCL 4 MG PO TABS: 4.0000 mg | ORAL_TABLET | Freq: Four times a day (QID) | ORAL | Status: DC | PRN

## 2014-07-29 MED ORDER — NICOTINE 21 MG/24HR TD PT24
21.0000 mg | MEDICATED_PATCH | Freq: Every day | TRANSDERMAL | Status: DC
  Administered 2014-07-29 – 2014-08-02 (×5): 21 mg via TRANSDERMAL
  Filled 2014-07-29 (×5): qty 1

## 2014-07-29 MED ORDER — SCOPOLAMINE 1 MG/3DAYS TD PT72
1.0000 | MEDICATED_PATCH | TRANSDERMAL | Status: DC
  Administered 2014-08-01: 1.5 mg via TRANSDERMAL
  Filled 2014-07-29: qty 1

## 2014-07-29 MED ORDER — METHYLPREDNISOLONE SODIUM SUCC 125 MG IJ SOLR
125.0000 mg | Freq: Once | INTRAMUSCULAR | Status: AC
  Administered 2014-07-29: 125 mg via INTRAVENOUS
  Filled 2014-07-29: qty 2

## 2014-07-29 MED ORDER — LORAZEPAM 2 MG/ML IJ SOLN
INTRAMUSCULAR | Status: AC
  Filled 2014-07-29: qty 1

## 2014-07-29 MED ORDER — SODIUM CHLORIDE 0.9 % IV SOLN
INTRAVENOUS | Status: AC
  Administered 2014-07-29 (×2): via INTRAVENOUS

## 2014-07-29 MED ORDER — ACETAMINOPHEN 325 MG PO TABS: 650.0000 mg | ORAL_TABLET | Freq: Four times a day (QID) | ORAL | Status: DC | PRN

## 2014-07-29 MED ORDER — SODIUM CHLORIDE 0.9 % IV BOLUS (SEPSIS)
1000.0000 mL | Freq: Once | INTRAVENOUS | Status: AC
  Administered 2014-07-29: 1000 mL via INTRAVENOUS

## 2014-07-29 MED ORDER — ALBUTEROL SULFATE (2.5 MG/3ML) 0.083% IN NEBU: 2.5000 mg | INHALATION_SOLUTION | RESPIRATORY_TRACT | Status: DC | PRN

## 2014-07-29 MED ORDER — POLYETHYLENE GLYCOL 3350 17 G PO PACK
17.0000 g | PACK | Freq: Every day | ORAL | Status: DC | PRN
  Filled 2014-07-29: qty 1

## 2014-07-29 NOTE — ED Notes (Signed)
MD at bedside. 

## 2014-07-29 NOTE — H&P (Signed)
[facility] History and Physical  Nicholas Soto KVQ:[id number] DOB: [birth year] DOA: 07/29/2014  Referring physician: er PCP: [name], MD   Chief Complaint: fatigue  HPI: Nicholas Soto is a 63 y.o. male  Who was recently discharged from hospital to [facility] rehab.  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 He just finished 30 grays of radiation for this.  Patient checked himself out of rehab Friday and has been at home with family since then.  Family reports he has been short of breath and more sleepy since then.   He has not been eating or drinking well.  Family reports that he has not been taking extra of pain medications or taking them early.    IN the ER, his Na was found to be low, and he was found to be wheezing, await U/A Radiation is completed and plan is to start platinum based treatment with Carbo plus a taxane plus/minus bevacizumab wiith completion of his radiation per Dr. [name] notes- will add to treatment team.  Review of Systems:  All systems reviewed, negative unless stated above  Past Medical History  Diagnosis Date  . Chest pain     previous CP thought r/t GI; mild CAD by cath 2002  . Hypertension     pt denies h/o htn  . COPD (chronic obstructive pulmonary disease)   . Hyperlipidemia     taken off statin 1 mo ago by primary care bc he was "put on a new med that might interfere"  . Tobacco abuse   . 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 Gout   . Shortness of breath   . Syncope   . Cancer Left    of the eye  . 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 Shoulder surgery    . Neck surgery    . Eye surgery      cancer  . Spine surgery    . Knee cartilage surgery    . Cholecystectomy     Social History:  reports that he has been smoking Cigarettes.  He has a 10 pack-year smoking history. He has  never used smokeless tobacco. He reports that he does not drink alcohol or use illicit drugs.  Allergies  Allergen Reactions  . Demerol [Meperidine] Nausea Only  . Meperidine Hcl Nausea And Vomiting    Family History  Problem Relation Age of Onset  . Heart attack Mother 49  . Heart attack Brother 25  . Diabetes Mother   . Multiple sclerosis Brother   . Diabetes Sister   . Alzheimer's disease Maternal Grandmother   . Diabetes Maternal Grandmother   . Seizures Father      Prior to Admission medications   Medication Sig Start Date End Date Taking? Authorizing Provider  Aclidinium Bromide 400 MCG/ACT AEPB Inhale 1 puff into the lungs 2 (two) times daily.   Yes Historical Provider, MD  albuterol (PROVENTIL HFA;VENTOLIN HFA) 108 (90 BASE) MCG/ACT inhaler Inhale 2 puffs into the lungs every 6 (six) hours as needed for wheezing or shortness of breath.   Yes Historical Provider, MD  antiseptic oral rinse (BIOTENE) LIQD 15 mLs by Mouth Rinse route 2 (two) times daily. 07/24/14  Yes Venetia Maxon Rama, MD  budesonide-formoterol (SYMBICORT) 160-4.5 MCG/ACT inhaler Inhale 2 puffs into the lungs at bedtime.   Yes Historical  Provider, MD  dexamethasone (DECADRON) 4 MG tablet Take 1 tablet (4 mg total) by mouth every 6 (six) hours. 07/24/14  Yes Venetia Maxon Rama, MD  diphenhydrAMINE (SOMINEX) 25 MG tablet Take 25 mg by mouth at bedtime as needed for sleep.    Yes Historical Provider, MD  feeding supplement, ENSURE COMPLETE, (ENSURE COMPLETE) LIQD Take 237 mLs by mouth 3 (three) times daily between meals. 07/24/14  Yes Venetia Maxon Rama, MD  ibuprofen (ADVIL,MOTRIN) 200 MG tablet Take 400 mg by mouth every 6 (six) hours as needed for moderate pain.   Yes Historical Provider, MD  levalbuterol Penne Lash) 0.63 MG/3ML nebulizer solution Take 3 mLs (0.63 mg total) by nebulization every 6 (six) hours as needed for wheezing or shortness of breath. 07/24/14  Yes Christina P Rama, MD  LORazepam (ATIVAN) 0.5 MG tablet  Take 2 tablets (1 mg total) by mouth 2 (two) times daily as needed for anxiety. 07/24/14  Yes Venetia Maxon Rama, MD  mirtazapine (REMERON) 15 MG tablet Take 1 tablet (15 mg total) by mouth at bedtime. 07/24/14  Yes Christina P Rama, MD  montelukast (SINGULAIR) 10 MG tablet Take 10 mg by mouth every morning.    Yes Historical Provider, MD  nicotine (NICODERM CQ - DOSED IN MG/24 HOURS) 21 mg/24hr patch Place 1 patch (21 mg total) onto the skin daily. 07/24/14  Yes Christina P Rama, MD  omeprazole (PRILOSEC) 20 MG capsule Take 20 mg by mouth every morning.   Yes Historical Provider, MD  ondansetron (ZOFRAN) 4 MG tablet Take 1 tablet (4 mg total) by mouth every 6 (six) hours as needed for nausea. 07/24/14  Yes Venetia Maxon Rama, MD  oxyCODONE 20 MG TABS Take 2-3 tablets (40-60 mg total) by mouth every 3 (three) hours as needed for moderate pain, severe pain or breakthrough pain. 07/24/14  Yes Venetia Maxon Rama, MD  OxyCODONE 60 MG T12A Take 120 mg by mouth every 8 (eight) hours. 07/24/14  Yes Christina P Rama, MD  pantoprazole (PROTONIX) 40 MG tablet Take 40 mg by mouth every morning.    Yes Historical Provider, MD  PARoxetine (PAXIL) 10 MG tablet Take 10 mg by mouth every morning.   Yes Historical Provider, MD  polyethylene glycol (MIRALAX / GLYCOLAX) packet Take 17 g by mouth daily as needed for mild constipation. 07/24/14  Yes Christina P Rama, MD  pregabalin (LYRICA) 75 MG capsule Take 1 capsule (75 mg total) by mouth 2 (two) times daily. 07/24/14  Yes Venetia Maxon Rama, MD  promethazine (PHENERGAN) 25 MG tablet Take 25 mg by mouth every 8 (eight) hours as needed for nausea or vomiting.   Yes Historical Provider, MD  scopolamine (TRANSDERM-SCOP) 1 MG/3DAYS Place 1 patch (1.5 mg total) onto the skin every 3 (three) days. 07/24/14  Yes Venetia Maxon Rama, MD  senna (SENOKOT) 8.6 MG TABS tablet Take 1 tablet (8.6 mg total) by mouth daily. 07/24/14  Yes Venetia Maxon Rama, MD   Physical Exam: Filed Vitals:   07/29/14  1422 07/29/14 1430 07/29/14 1500 07/29/14 1530  BP: 132/54 134/89 124/63 119/64  Pulse: 98  96 89  Temp: 98.7 F (37.1 C)     TempSrc: Oral     Resp:  15 17 17   SpO2: 95%  95% 89%    Wt Readings from Last 3 Encounters:  07/13/14 74.662 kg (164 lb 9.6 oz)  03/08/12 81.647 kg (180 lb)  02/03/12 82.555 kg (182 lb)    General:  Appears calm and comfortable-  thin white male Eyes: PERRL, normal lids, irises & conjunctiva ENT: poor dentation Neck: no LAD, masses or thyromegaly Cardiovascular: RRR, no m/r/g. No LE edema. Respiratory: wheezing b/l Abdomen: soft, ntnd Skin: no rash or induration seen on limited exam Musculoskeletal: grossly normal tone BUE/BLE Psychiatric: grossly normal mood and affect, speech fluent and appropriate Neurologic: grossly non-focal.          Labs on Admission:  Basic Metabolic Panel:  Recent Labs Lab 07/29/14 1229  NA 131*  K 4.5  CL 94*  CO2 25  GLUCOSE 119*  BUN 23  CREATININE 0.46*  CALCIUM 8.8   Liver Function Tests:  Recent Labs Lab 07/29/14 1229  AST 21  ALT 13  ALKPHOS 105  BILITOT 0.8  PROT 6.3  ALBUMIN 2.8*   No results found for this basename: LIPASE, AMYLASE,  in the last 168 hours No results found for this basename: AMMONIA,  in the last 168 hours CBC:  Recent Labs Lab 07/29/14 1229  WBC 6.2  HGB 12.9*  HCT 38.1*  MCV 89.0  PLT 230   Cardiac Enzymes: No results found for this basename: CKTOTAL, CKMB, CKMBINDEX, TROPONINI,  in the last 168 hours  BNP (last 3 results) No results found for this basename: PROBNP,  in the last 8760 hours CBG: No results found for this basename: GLUCAP,  in the last 168 hours  Radiological Exams on Admission: Dg Chest Portable 1 View  07/29/2014   CLINICAL DATA:  Fatigue, history of metastatic disease.  EXAM: PORTABLE CHEST - 1 VIEW  COMPARISON:  Chest radiograph April 23, 2014 and CT of the chest July 13, 2014  FINDINGS: Cardiac silhouette is unremarkable. Widened superior  mediastinum corresponding to known lymphadenopathy. No pleural effusions. No focal consolidation. No pneumothorax. Soft tissue planes and included osseous structures are unchanged.  IMPRESSION: No acute cardiopulmonary process.  Re demonstration of mediastinal lymphadenopathy.   Electronically Signed   By: Elon Alas   On: 07/29/2014 12:37      Assessment/Plan Active Problems:   TOBACCO ABUSE   COPD exacerbation   Bone metastasis   Dehydration   COPD exacerbation- add abx, nebs, nicotine patch, solumedrol -wears O2 at home  Dehydration- gentle IVF  AMS/lethergy/fatigue- check TSH, ? Baseline- patient able to wake up and talk in; ?if patient taking too much pain medication at home -patient was not walking when he checked himself out of the rehab  Hyponatremia- IVF and recheck  Chronic pain- continue home meds, if patient becomes lethargic, may need to decrease dose   Oncology added to treatment team   Code Status: full DVT Prophylaxis: Family Communication: family at bedside Disposition Plan:   Time spent: 35 min  Eulogio Bear [facility] Pager [phone]  **Disclaimer: This note may have been dictated with voice recognition software. Similar sounding words can inadvertently be transcribed and this note may contain transcription errors which may not have been corrected upon publication of note.**

## 2014-07-29 NOTE — ED Notes (Signed)
Pt to oncology for radiation treatment; nurse noticed pt has "gurgling sound" with respirations and appears to be more lethargic; pt recently discharged home and states worse since last week;

## 2014-07-29 NOTE — ED Notes (Signed)
Bed: BF38 Expected date: 07/29/14 Expected time: 11:21 AM Means of arrival: Ambulance Comments: Transfer from oncology

## 2014-07-29 NOTE — Progress Notes (Signed)
   Weekly Management Note  outpatient   ICD-9-CM  1. Bone metastasis 198.5    Completed Radiotherapy. Total Dose: 30 Gy   Narrative:  The patient presents for routine under treatment assessment on last day of radiotherapy.  CBCT/MVCT images/Port film x-rays were reviewed.  The chart was checked. Recently discharged from [place]. Per nursing and wife, he is more somnolent, and short of breath.  Gurgling noises audible when breathing.  Urine is brown.    Physical Findings:  temperature is 98.1 F (36.7 C). His blood pressure is 121/60 and his pulse is 107. His oxygen saturation is 97%.   He appears somnolent. No obvious RT irritation over back. He does have subcutaneous soft tissue masses over back - unclear etiology.  Gurgling noises when breathing.  On auscultation with stethoscope, he has coarse sounds throughout both lungs, especially at bases  Impression:  The patient has tolerated radiotherapy to his lower spine. Concern for respiratory symptoms, decreased mental status, tachycardia  Plan:  Routine follow-up in one month. Send to ED to address symptoms above (infection vs fluid overload?) may need admission.  ________________________________   Eppie Gibson, M.D.

## 2014-07-29 NOTE — Progress Notes (Signed)
ANTIBIOTIC CONSULT NOTE - INITIAL  Pharmacy Consult for Levaquin Indication: COPD exacerbation  Allergies  Allergen Reactions  . Demerol [Meperidine] Nausea Only  . Meperidine Hcl Nausea And Vomiting    Patient Measurements:   Adjusted Body Weight:  Vital Signs: Temp: 98.7 F (37.1 C) (08/03 1422) Temp src: Oral (08/03 1422) BP: 119/64 mmHg (08/03 1530) Pulse Rate: 94 (08/03 1615) Intake/Output from previous day:   Intake/Output from this shift:    Labs:  Recent Labs  07/29/14 1229  WBC 6.2  HGB 12.9*  PLT 230  CREATININE 0.46*   The CrCl is unknown because both a height and weight (above a minimum accepted value) are required for this calculation. No results found for this basename: VANCOTROUGH, VANCOPEAK, VANCORANDOM, GENTTROUGH, GENTPEAK, GENTRANDOM, TOBRATROUGH, TOBRAPEAK, TOBRARND, AMIKACINPEAK, AMIKACINTROU, AMIKACIN,  in the last 72 hours   Microbiology: No results found for this or any previous visit (from the past 720 hour(s)).  Medical History: Past Medical History  Diagnosis Date  . Chest pain     previous CP thought r/t GI; mild CAD by cath 2002  . Hypertension     pt denies h/o htn  . COPD (chronic obstructive pulmonary disease)   . Hyperlipidemia     taken off statin 1 mo ago by primary care bc he was "put on a new med that might interfere"  . Tobacco abuse   . 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 Gout   . Shortness of breath   . Syncope   . Cancer Left    of the eye  . 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 Pharmacy is asked to start Levaquin for COPD exacerbation. Recent weight 75kg. SCr wnl, CrCl >1104ml/min/1.73m2.   Goal of Therapy:  Appropriate antibiotic dosing for renal function; eradication of infection  Plan:    Levaquin 750mg  IV q24h.  Follow up renal fxn, clinical course, and culture results.  Romeo Rabon, PharmD, pager [phone]. 07/29/2014,4:55 PM.

## 2014-07-29 NOTE — ED Provider Notes (Signed)
CSN: [id number]     Arrival date & time 07/29/14  1132 History   First MD Initiated Contact with Patient 07/29/14 1156     Chief Complaint  Patient presents with  . Fatigue     (Consider location/radiation/quality/duration/timing/severity/associated sxs/prior Treatment) Patient is a 63 y.o. male presenting with weakness. The history is provided by the patient.  Weakness This is a new problem. The current episode started 2 days ago. The problem occurs constantly. The problem has been gradually worsening. Associated symptoms include shortness of breath. Pertinent negatives include no chest pain and no abdominal pain. Nothing aggravates the symptoms. Nothing relieves the symptoms.    Past Medical History  Diagnosis Date  . Chest pain     previous CP thought r/t GI; mild CAD by cath 2002  . Cancer Left    of the eye  . Hypertension     pt denies h/o htn  . COPD (chronic obstructive pulmonary disease)   . Hyperlipidemia     taken off statin 1 mo ago by primary care bc he was "put on a new med that might interfere"  . Tobacco abuse   . 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 Gout   . Shortness of breath   . Syncope    Past Surgical History  Procedure Laterality Date  . Shoulder surgery    . Neck surgery    . Eye surgery      cancer  . Spine surgery    . Knee cartilage surgery    . Cholecystectomy     Family History  Problem Relation Age of Onset  . Heart attack Mother 45  . Heart attack Brother 77  . Diabetes Mother   . Multiple sclerosis Brother   . Diabetes Sister   . Alzheimer's disease Maternal Grandmother   . Diabetes Maternal Grandmother   . Seizures Father    History  Substance Use Topics  . Smoking status: Smoker, Current Status Unknown -- 0.50 packs/day for 20 years    Types: Cigarettes  . Smokeless tobacco: Never Used  . Alcohol Use: No    Review of Systems  Constitutional: Positive for fatigue. Negative for fever.  Respiratory:  Positive for shortness of breath. Negative for cough.   Cardiovascular: Negative for chest pain.  Gastrointestinal: Negative for abdominal pain.  Neurological: Positive for weakness.  All other systems reviewed and are negative.     Allergies  Demerol and Meperidine hcl  Home Medications   Prior to Admission medications   Medication Sig Start Date End Date Taking? Authorizing Provider  Aclidinium Bromide 400 MCG/ACT AEPB Inhale 1 puff into the lungs 2 (two) times daily.   Yes Historical Provider, MD  albuterol (PROVENTIL HFA;VENTOLIN HFA) 108 (90 BASE) MCG/ACT inhaler Inhale 2 puffs into the lungs every 6 (six) hours as needed for wheezing or shortness of breath.   Yes Historical Provider, MD  antiseptic oral rinse (BIOTENE) LIQD 15 mLs by Mouth Rinse route 2 (two) times daily. 07/24/14  Yes Venetia Maxon Rama, MD  budesonide-formoterol (SYMBICORT) 160-4.5 MCG/ACT inhaler Inhale 2 puffs into the lungs at bedtime.   Yes Historical Provider, MD  dexamethasone (DECADRON) 4 MG tablet Take 1 tablet (4 mg total) by mouth every 6 (six) hours. 07/24/14  Yes Venetia Maxon Rama, MD  diphenhydrAMINE (SOMINEX) 25 MG tablet Take 25 mg by mouth at bedtime as needed for sleep.    Yes Historical Provider, MD  feeding supplement, ENSURE  COMPLETE, (ENSURE COMPLETE) LIQD Take 237 mLs by mouth 3 (three) times daily between meals. 07/24/14  Yes Venetia Maxon Rama, MD  ibuprofen (ADVIL,MOTRIN) 200 MG tablet Take 400 mg by mouth every 6 (six) hours as needed for moderate pain.   Yes Historical Provider, MD  levalbuterol Penne Lash) 0.63 MG/3ML nebulizer solution Take 3 mLs (0.63 mg total) by nebulization every 6 (six) hours as needed for wheezing or shortness of breath. 07/24/14  Yes Christina P Rama, MD  LORazepam (ATIVAN) 0.5 MG tablet Take 2 tablets (1 mg total) by mouth 2 (two) times daily as needed for anxiety. 07/24/14  Yes Venetia Maxon Rama, MD  mirtazapine (REMERON) 15 MG tablet Take 1 tablet (15 mg total) by mouth at  bedtime. 07/24/14  Yes Christina P Rama, MD  montelukast (SINGULAIR) 10 MG tablet Take 10 mg by mouth every morning.    Yes Historical Provider, MD  nicotine (NICODERM CQ - DOSED IN MG/24 HOURS) 21 mg/24hr patch Place 1 patch (21 mg total) onto the skin daily. 07/24/14  Yes Christina P Rama, MD  omeprazole (PRILOSEC) 20 MG capsule Take 20 mg by mouth every morning.   Yes Historical Provider, MD  ondansetron (ZOFRAN) 4 MG tablet Take 1 tablet (4 mg total) by mouth every 6 (six) hours as needed for nausea. 07/24/14  Yes Venetia Maxon Rama, MD  oxyCODONE 20 MG TABS Take 2-3 tablets (40-60 mg total) by mouth every 3 (three) hours as needed for moderate pain, severe pain or breakthrough pain. 07/24/14  Yes Venetia Maxon Rama, MD  OxyCODONE 60 MG T12A Take 120 mg by mouth every 8 (eight) hours. 07/24/14  Yes Christina P Rama, MD  pantoprazole (PROTONIX) 40 MG tablet Take 40 mg by mouth every morning.    Yes Historical Provider, MD  PARoxetine (PAXIL) 10 MG tablet Take 10 mg by mouth every morning.   Yes Historical Provider, MD  polyethylene glycol (MIRALAX / GLYCOLAX) packet Take 17 g by mouth daily as needed for mild constipation. 07/24/14  Yes Christina P Rama, MD  pregabalin (LYRICA) 75 MG capsule Take 1 capsule (75 mg total) by mouth 2 (two) times daily. 07/24/14  Yes Venetia Maxon Rama, MD  promethazine (PHENERGAN) 25 MG tablet Take 25 mg by mouth every 8 (eight) hours as needed for nausea or vomiting.   Yes Historical Provider, MD  scopolamine (TRANSDERM-SCOP) 1 MG/3DAYS Place 1 patch (1.5 mg total) onto the skin every 3 (three) days. 07/24/14  Yes Venetia Maxon Rama, MD  senna (SENOKOT) 8.6 MG TABS tablet Take 1 tablet (8.6 mg total) by mouth daily. 07/24/14  Yes Christina P Rama, MD   BP 143/68  Pulse 101  Temp(Src) 98.5 F (36.9 C)  Resp 20  SpO2 90% Physical Exam  Nursing note and vitals reviewed. Constitutional: He is oriented to person, place, and time. He appears well-developed and well-nourished. No  distress.  HENT:  Head: Normocephalic and atraumatic.  Mouth/Throat: No oropharyngeal exudate.  Eyes: EOM are normal. Pupils are equal, round, and reactive to light.  Neck: Normal range of motion. Neck supple.  Cardiovascular: Regular rhythm.  Tachycardia present.  Exam reveals no friction rub.   No murmur heard. Pulmonary/Chest: Effort normal. No respiratory distress. He has wheezes (mild, diffuse). He has rhonchi (mild, diffuse).  Abdominal: He exhibits no distension. There is no tenderness. There is no rebound.  Musculoskeletal: Normal range of motion. He exhibits no edema.  Neurological: He is alert and oriented to person, place, and time.  Skin:  He is not diaphoretic.    ED Course  Procedures (including critical care time) Labs Review Labs Reviewed  CBC  COMPREHENSIVE METABOLIC PANEL  URINALYSIS, ROUTINE W REFLEX MICROSCOPIC  I-STAT CG4 LACTIC ACID, ED    Imaging Review Dg Chest Portable 1 View  07/29/2014   CLINICAL DATA:  Fatigue, history of metastatic disease.  EXAM: PORTABLE CHEST - 1 VIEW  COMPARISON:  Chest radiograph April 23, 2014 and CT of the chest July 13, 2014  FINDINGS: Cardiac silhouette is unremarkable. Widened superior mediastinum corresponding to known lymphadenopathy. No pleural effusions. No focal consolidation. No pneumothorax. Soft tissue planes and included osseous structures are unchanged.  IMPRESSION: No acute cardiopulmonary process.  Re demonstration of mediastinal lymphadenopathy.   Electronically Signed   By: Elon Alas   On: 07/29/2014 12:37     EKG Interpretation None      Date: 07/29/2014  Rate: 99  Rhythm: normal sinus rhythm  QRS Axis: normal  Intervals: normal  ST/T Wave abnormalities: normal  Conduction Disutrbances:none  Narrative Interpretation:   Old EKG Reviewed: unchanged    MDM   Final diagnoses:  Dehydration  Other fatigue    59 400 here for fatigue. 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 He just finished 30 grays of radiation for this today. From radiation, he is noted to be more lethargic less here for further evaluation. He was in rehabilitation for a day or 2 after his discharge and felt better 2 days ago, but since returning home has progressively worsened. He's having some difficulty breathing with audible gurgling respirations. He is alert and knows what is going on, but is lethargic. Her vitals show 97% on 3 L of oxygen, 107 pulse. He is afebrile. Lungs with some diffuse wheezing and rhonchi. Concern for possible infection. We'll check labs, chest x-ray, urine studies. Labs unremarkable, but still feeling weak. I spoke with Dr. Julien Nordmann, he states worsening weakness in his clinic setting of diffuse mets with unknown primary is reasonable for admission. Dr. Eliseo Squires admitting.  Osvaldo Shipper, MD 07/29/14 [phone]

## 2014-07-29 NOTE — Progress Notes (Signed)
Pt arrived on unit yelling he can't breath. O2 via [place] @ 2l was intact. Pt yelling he was in pain and can't breath. When given report from th ED nurse she states he was given several pain meds and nothing helped his pain. O2 sat was 94%. He was very restless constantly changing his position from lying in bed to sitting up. Dr. Eliseo Squires called. She ordered Ativan 1 mg IV to help his anxiety. Resp. Called for tx. Pain med. was also given. Family at bedside.

## 2014-07-29 NOTE — ED Notes (Signed)
Pt. Is not able to use the restroom at this time but is aware that we need a urine specimen.

## 2014-07-29 NOTE — Progress Notes (Signed)
Patient completes treatment.Patient is not as alert as last Thursday.  Able to answer to person and birthdate.Gurgling is audible without stethoscope.Needs refill on oxycontin long-acting.Med/onc appointment has been scheduled for 08/07/14.Patient was taken home from [place] rehab by wife last week.

## 2014-07-29 NOTE — ED Notes (Addendum)
Attempted calling report to RN. Will call back promptly.

## 2014-07-29 NOTE — ED Notes (Signed)
Per wife-patient has been vomiting for the past three months. Had diarrhea occurrence yesterday. Denies blood in vomit or emesis. Per wife "even when he was in the hospital with a PCA pump receiving Dilaudid he still was more awake and alert than he has been the last two days." States "he started sleeping a lot yesterday." Had two oxycontin 60 mg today at 0600. Pt arousable and oriented x4. Audible gurgling noted and congested, moist coughing. In NAD. Awaiting MD.

## 2014-07-30 DIAGNOSIS — C7951 Secondary malignant neoplasm of bone: Principal | ICD-10-CM

## 2014-07-30 DIAGNOSIS — C7952 Secondary malignant neoplasm of bone marrow: Principal | ICD-10-CM

## 2014-07-30 DIAGNOSIS — F411 Generalized anxiety disorder: Secondary | ICD-10-CM

## 2014-07-30 LAB — CBC
HEMATOCRIT: 31.3 % — AB (ref 39.0–52.0)
Hemoglobin: 11 g/dL — ABNORMAL LOW (ref 13.0–17.0)
MCH: 30.3 pg (ref 26.0–34.0)
MCHC: 35.1 g/dL (ref 30.0–36.0)
MCV: 86.2 fL (ref 78.0–100.0)
Platelets: 182 10*3/uL (ref 150–400)
RBC: 3.63 MIL/uL — AB (ref 4.22–5.81)
RDW: 13.8 % (ref 11.5–15.5)
WBC: 9 10*3/uL (ref 4.0–10.5)

## 2014-07-30 LAB — BASIC METABOLIC PANEL
Anion gap: 12 (ref 5–15)
BUN: 21 mg/dL (ref 6–23)
CO2: 22 mEq/L (ref 19–32)
Calcium: 8.3 mg/dL — ABNORMAL LOW (ref 8.4–10.5)
Chloride: 96 mEq/L (ref 96–112)
Creatinine, Ser: 0.43 mg/dL — ABNORMAL LOW (ref 0.50–1.35)
GFR calc Af Amer: >90 mL/min (ref >90)
GLUCOSE: 110 mg/dL — AB (ref 70–99)
POTASSIUM: 3.7 meq/L (ref 3.7–5.3)
SODIUM: 130 meq/L — AB (ref 137–147)

## 2014-07-30 LAB — TROPONIN I: Troponin I: <0.3 ng/mL (ref <0.30)

## 2014-07-30 MED ORDER — LEVALBUTEROL HCL 0.63 MG/3ML IN NEBU
0.6300 mg | INHALATION_SOLUTION | Freq: Three times a day (TID) | RESPIRATORY_TRACT | Status: DC
  Administered 2014-07-30 – 2014-08-02 (×9): 0.63 mg via RESPIRATORY_TRACT
  Filled 2014-07-30 (×21): qty 3

## 2014-07-30 MED ORDER — IPRATROPIUM BROMIDE 0.02 % IN SOLN
0.5000 mg | Freq: Three times a day (TID) | RESPIRATORY_TRACT | Status: DC
  Administered 2014-07-30 – 2014-08-02 (×9): 0.5 mg via RESPIRATORY_TRACT
  Filled 2014-07-30 (×9): qty 2.5

## 2014-07-30 MED ORDER — LEVALBUTEROL HCL 0.63 MG/3ML IN NEBU: 0.6300 mg | INHALATION_SOLUTION | Freq: Four times a day (QID) | RESPIRATORY_TRACT | Status: DC | PRN

## 2014-07-30 NOTE — Progress Notes (Signed)
INITIAL NUTRITION ASSESSMENT  DOCUMENTATION CODES Per approved criteria  -Severe malnutrition in the context of chronic illness  Pt meets criteria for severe MALNUTRITION in the context of chronic illness as evidenced by 7.8% body weight, PO intake < 75% for > one month.;  INTERVENTION: -Recommend addition of [employer] Instant Breakfast with meals-family to order -Consider addition of Remeron for appetite stimulant (pt received during previous admit  06/2014) -Continue to encourage/promote Ensure Complete TID -Promoted intake of 8PM snack (crackers w/peanut butter) -Will continue to monitor  NUTRITION DIAGNOSIS: Inadequate oral intake related to decreased appetite/pain as evidenced by PO intake < 75%, 7.8% est body weight loss in one month.   Goal: Pt to meet >/= 90% of their estimated nutrition needs    Monitor:  Total protein/energy intake, labs, weights  Reason for Assessment: MST  63 y.o. male  Admitting Dx: <principal problem not specified>  ASSESSMENT: Nicholas Soto is a 63 y.o. male Who was recently discharged from hospital to [facility] rehab. 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 He just finished 30 grays of radiation for this. Patient checked himself out of rehab Friday and has been at home with family since then. Family reports he has been short of breath and more sleepy since then.   -Pt's wife reported ongoing decreased appetite and PO intake for 3 months -Diet recall indicates pt consuming popsicles, and small bites of sandwiches. Does not like Ensure Complete supplements; however was more willing to consume them during previous admit when mixed with ice cream -Current PO intake <25%. Discussed addition of [employer], which pt may tolerate better than Ensure/Boost. Family verbalized understanding, and was in agreement to order w/whole milk for pt tomorrow at breakfast -Discussed  addition of nighttime snacks, family noted he would likely consume crackers and peanut butter. Encouraged family to procure them from unit kitchen -Endorsed an unintentional wt loss of 13-14 lbs in one month, (7.8% body weight loss, severe for time frame)  Height: Ht Readings from Last 1 Encounters:  07/29/14 5\' 9"  (1.753 m)    Weight: Wt Readings from Last 1 Encounters:  07/29/14 164 lb (74.39 kg)    Ideal Body Weight: 160 lbs  % Ideal Body Weight: 103%  Wt Readings from Last 10 Encounters:  07/29/14 164 lb (74.39 kg)  07/13/14 164 lb 9.6 oz (74.662 kg)  03/08/12 180 lb (81.647 kg)  02/03/12 182 lb (82.555 kg)  01/27/12 182 lb 5.1 oz (82.7 kg)  01/24/12 182 lb 5.1 oz (82.7 kg)  10/07/09 208 lb 6.1 oz (94.521 kg)  09/15/09 204 lb 8 oz (92.761 kg)    Usual Body Weight: 178 lbs  % Usual Body Weight: 92%  BMI:  Body mass index is 24.21 kg/(m^2).  Estimated Nutritional Needs: Kcal: 2200-2400 Protein: 105-115 gram Fluid: >/= 2200 ml/daily  Skin: WDL  Diet Order: General  EDUCATION NEEDS: -Education needs addressed   Intake/Output Summary (Last 24 hours) at 07/30/14 1526 Last data filed at 07/30/14 1315  Gross per 24 hour  Intake 1036.67 ml  Output   1850 ml  Net -813.33 ml    Last BM: 8/02   Labs:   Recent Labs Lab 07/29/14 1229 07/30/14 0430  NA 131* 130*  K 4.5 3.7  CL 94* 96  CO2 25 22  BUN 23 21  CREATININE 0.46* 0.43*  CALCIUM 8.8 8.3*  GLUCOSE 119* 110*    CBG (last 3)  No results found for this  basename: GLUCAP,  in the last 72 hours  Scheduled Meds: . antiseptic oral rinse  15 mL Mouth Rinse BID  . dexamethasone  4 mg Oral 4 times per day  . enoxaparin (LOVENOX) injection  40 mg Subcutaneous Q24H  . feeding supplement (ENSURE COMPLETE)  237 mL Oral TID BM  . ipratropium  0.5 mg Nebulization TID  . levalbuterol  0.63 mg Nebulization TID  . levofloxacin (LEVAQUIN) IV  750 mg Intravenous Q24H  . mirtazapine  15 mg Oral QHS  .  montelukast  10 mg Oral QHS  . nicotine  21 mg Transdermal Daily  . OxyCODONE  120 mg Oral 3 times per day  . pantoprazole  80 mg Oral Daily  . PARoxetine  10 mg Oral q morning - 10a  . pregabalin  75 mg Oral BID  . [START ON 08/01/2014] scopolamine  1 patch Transdermal Q72H  . senna  1 tablet Oral Daily    Continuous Infusions:   Past Medical History  Diagnosis Date  . Chest pain     previous CP thought r/t GI; mild CAD by cath 2002  . Hypertension     pt denies h/o htn  . COPD (chronic obstructive pulmonary disease)   . Hyperlipidemia     taken off statin 1 mo ago by primary care bc he was "put on a new med that might interfere"  . Tobacco abuse   . 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 Gout   . Shortness of breath   . Syncope   . Cancer Left    of the eye  . 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 Shoulder surgery    . Neck surgery    . Eye surgery      cancer  . Spine surgery    . Knee cartilage surgery    . Cholecystectomy      Leland Savage Clinical Dietitian [phone]

## 2014-07-30 NOTE — Progress Notes (Signed)
Patient ID: Nicholas Soto, male   DOB: 1951-10-21, 63 y.o.   MRN: [id number] [facility] PROGRESS NOTE  MEAGAN SPEASE FHL:[id number] DOB: 1951/09/04 DOA: 07/29/2014 PCP: [name], MD  Brief narrative: 63 y.o. 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 Patient's family reported that he has had very poor by mouth intake and has been taking large amounts of pain medications to control the pain. Patient was receiving radiation treatment which is now completed and plan is for chemotherapy under Dr. [name] care.  Assessment and Plan:   Principal Problem:  Intractable cancer related pain, fatigue, weakness in the setting of widespread bony metastases of unknown primary Patient has completed radiation therapy as planned. Will will inform Dr. [name] of patient's admission plan is for chemotherapy but we will see with oncology if this can be done while patient is inpatient. Continue Decadron 4 mg by mouth every 6 hours. Continue pain management efforts: Oxycodone 120 mg every 8 hours, oxycodone 40 mg by mouth every 3 hours for moderate pain. Adjuvant therapy with pregabalin 75 mg twice daily, Ativan 1 mg IV every 6 hours when necessary  Active Problems:  TOBACCO ABUSE  Nicotine patch ordered. Tobacco cessation counseling provided per nursing staff. Anemia of chronic disease  Likely due to malignancy. Hemoglobin is 12.9. No current indications for transfusion. Chronic obstructive pulmonary disease / Acute respiratory failure with hypoxia  On home oxygen. Continue Atrovent and Xopenex 3 times a day. Order also placed for Xopenex every 6 hours as needed for shortness of breath or wheezing. Patient continues to have  gurgling sounds as baseline. We will continue the scopolamine patch to help with reducing secretions. Levaquin orders for possible bronchitis / pneumonia Anxiety and depression  Continue Paxil 10 mg in the morning. Continue Ativan 1 mg IV every 6 hours as needed. Unspecified hereditary and idiopathic peripheral neuropathy  Continue lyrica 75 mg PO BID. Functional quadriplegia  Limited mobility secondary to severe pain from bone metastases.  Severe protein calorie malnutrition  Continue ensure supplementation.  DVT prophylaxis / GI prophylaxis Lovenox ordered while inpatient. No signs of bleeding.  Protonix for GI prophylaxis since patient is on steroids.  Code Status: Full.  Family Communication:  plan of care discussed with the patient's wife at the bedside  Disposition Plan: Home when stable.    IV Access:   Peripheral IV Procedures and diagnostic studies:    Dg Chest Portable 1 View 07/29/2014   IMPRESSION: No acute cardiopulmonary process.  Re demonstration of mediastinal lymphadenopathy.    Medical Consultants:   Dr. Concha Norway, Oncology  Other Consultants:   Physical therapy  Anti-Infectives:   Levaquin 07/29/2014 -->   [name], MD  [facility] Pager [phone]  If 7PM-7AM, please contact night-coverage [email or web address] Password [email or web address] 07/30/2014, 12:04 PM   LOS: 1 day    HPI/Subjective: No acute overnight events.  Objective: Filed Vitals:   07/29/14 1845 07/29/14 1919 07/29/14 2153 07/30/14 0539  BP: 147/128  141/98 137/61  Pulse: 141  102 90  Temp: 98.6 F (37 C)  98.1 F (36.7 C) 98.4 F (36.9 C)  TempSrc: Axillary  Oral   Resp: 30  22 16   Height:      Weight:      SpO2:  94% 92% 96% 95%    Intake/Output Summary (Last 24 hours) at 07/30/14 1204 Last data filed at 07/30/14 0900  Gross per 24 hour  Intake 916.67 ml  Output   1250 ml  Net -333.33 ml    Exam:   General:  Pt is sleeping, not in acute distress  Cardiovascular: Regular rate  and rhythm, S1/S2, no murmurs  Respiratory: Gurgling sounds appreciated, mild wheezing he the upper lobes  Abdomen: Soft, non tender, non distended, bowel sounds present  Extremities: No edema, pulses DP and PT palpable bilaterally  Neuro: Grossly nonfocal  Data Reviewed: Basic Metabolic Panel:  Recent Labs Lab 07/29/14 1229 07/30/14 0430  NA 131* 130*  K 4.5 3.7  CL 94* 96  CO2 25 22  GLUCOSE 119* 110*  BUN 23 21  CREATININE 0.46* 0.43*  CALCIUM 8.8 8.3*   Liver Function Tests:  Recent Labs Lab 07/29/14 1229  AST 21  ALT 13  ALKPHOS 105  BILITOT 0.8  PROT 6.3  ALBUMIN 2.8*   No results found for this basename: LIPASE, AMYLASE,  in the last 168 hours No results found for this basename: AMMONIA,  in the last 168 hours CBC:  Recent Labs Lab 07/29/14 1229 07/30/14 0430  WBC 6.2 9.0  HGB 12.9* 11.0*  HCT 38.1* 31.3*  MCV 89.0 86.2  PLT 230 182   Cardiac Enzymes:  Recent Labs Lab 07/29/14 1710 07/29/14 2302 07/30/14 0430  TROPONINI <0.30 <0.30 <0.30   BNP: No components found with this basename: POCBNP,  CBG: No results found for this basename: GLUCAP,  in the last 168 hours  No results found for this or any previous visit (from the past 240 hour(s)).   Scheduled Meds: . dexamethasone  4 mg Oral 4 times per day  . enoxaparin (LOVENOX) injection  40 mg Subcutaneous Q24H  . feeding supplement   237 mL Oral TID BM  . ipratropium  0.5 mg Nebulization TID  . levalbuterol  0.63 mg Nebulization TID  . levofloxacin (LEVAQUIN)   750 mg Intravenous Q24H  . mirtazapine  15 mg Oral QHS  . montelukast  10 mg Oral QHS  . nicotine  21 mg Transdermal Daily  . OxyCODONE  120 mg Oral 3 times per day  . pantoprazole  80 mg Oral Daily  . PARoxetine  10 mg Oral q morning - 10a  . pregabalin  75 mg Oral BID  . scopolamine  1 patch Transdermal Q72H  . senna  1 tablet Oral Daily

## 2014-07-31 NOTE — Progress Notes (Signed)
Patient ID: Nicholas Soto, male   DOB: 1951-01-30, 63 y.o.   MRN: [id number] [facility] PROGRESS NOTE  LESHAUN BIEBEL Soto:[id number] DOB: Nov 14, 1951 DOA: 07/29/2014 PCP: [name], MD  Brief narrative: 63 y.o. 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 Patient's family reported that he has had very poor by mouth intake and has been taking large amounts of pain medications to control the pain. Patient was receiving radiation treatment which is now completed and plan is for chemotherapy under Dr. [name] care.   Assessment and Plan:   Principal Problem:  Intractable cancer related pain, fatigue, weakness in the setting of widespread bony metastases of unknown primary  Patient has completed radiation therapy as planned. Will will inform Dr. [name] of patient's admission. Continue Decadron 4 mg by mouth every 6 hours.  Continue pain management efforts: Oxycodone 120 mg every 8 hours, oxycodone 40 mg by mouth every 3 hours for moderate pain.  Adjuvant therapy with pregabalin 75 mg twice daily, Ativan 1 mg IV every 6 hours when necessary Active Problems:  TOBACCO ABUSE  Nicotine patch ordered. Tobacco cessation counseling provided per nursing staff. Anemia of chronic disease  Likely due to malignancy. Hemoglobin is 12.9 --> 11. No current indications for transfusion. Chronic obstructive pulmonary disease / Acute respiratory failure with hypoxia  On home oxygen. Continue Atrovent and Xopenex 3 times a day. Order also placed for Xopenex every 6 hours as needed for shortness of breath or wheezing.  We will continue the scopolamine patch to help with reducing secretions.  Levaquin ordered for possible bronchitis /  pneumonia Anxiety and depression  Continue Paxil 10 mg in the morning. Continue Ativan 1 mg IV every 6 hours as needed. Unspecified hereditary and idiopathic peripheral neuropathy  Continue lyrica 75 mg PO BID. Functional quadriplegia  Limited mobility secondary to severe pain from bone metastases.  Severe protein calorie malnutrition  Continue ensure supplementation.  DVT prophylaxis / GI prophylaxis  Lovenox ordered while inpatient. No signs of bleeding.  Protonix for GI prophylaxis since patient is on steroids.   Code Status: Full.  Family Communication: plan of care discussed with the patient's wife at the bedside  Disposition Plan: Home when stable.   IV Access:   Peripheral IV Procedures and diagnostic studies:    Dg Chest Portable 1 View 07/29/2014 IMPRESSION: No acute cardiopulmonary process. Re demonstration of mediastinal lymphadenopathy.   Medical Consultants:   Dr. Concha Norway, Oncology   Other Consultants:   Physical therapy   Anti-Infectives:   Levaquin 07/29/2014 -->   [name], MD  [facility] Pager [phone]  If 7PM-7AM, please contact night-coverage [email or web address] Password [email or web address] 07/31/2014, 2:02 PM   LOS: 2 days    HPI/Subjective: No acute overnight events.  Objective: Filed Vitals:   07/30/14 2056 07/30/14 2114 07/31/14 0534 07/31/14 0813  BP:  124/57 124/54   Pulse:  94 82   Temp:  98.2 F (36.8 C) 97.8 F (36.6 C)   TempSrc:  Oral Oral   Resp:  18 18   Height:      Weight:      SpO2: 96% 94% 94% 93%    Intake/Output Summary (Last 24 hours) at 07/31/14 1402 Last data filed at 07/31/14 0840  Gross per 24  hour  Intake    240 ml  Output    700 ml  Net   -460 ml    Exam:   General:  Pt is alert, follows commands appropriately, not in acute distress  Cardiovascular: Regular rate and rhythm, S1/S2, no murmurs  Respiratory: gurgling still present, wheezing in upper and mid lung lobes   Abdomen: Soft, non tender, non  distended, bowel sounds present  Extremities: No edema, pulses DP and PT palpable bilaterally  Neuro: Grossly nonfocal  Data Reviewed: Basic Metabolic Panel:  Recent Labs Lab 07/29/14 1229 07/30/14 0430  NA 131* 130*  K 4.5 3.7  CL 94* 96  CO2 25 22  GLUCOSE 119* 110*  BUN 23 21  CREATININE 0.46* 0.43*  CALCIUM 8.8 8.3*   Liver Function Tests:  Recent Labs Lab 07/29/14 1229  AST 21  ALT 13  ALKPHOS 105  BILITOT 0.8  PROT 6.3  ALBUMIN 2.8*   No results found for this basename: LIPASE, AMYLASE,  in the last 168 hours No results found for this basename: AMMONIA,  in the last 168 hours CBC:  Recent Labs Lab 07/29/14 1229 07/30/14 0430  WBC 6.2 9.0  HGB 12.9* 11.0*  HCT 38.1* 31.3*  MCV 89.0 86.2  PLT 230 182   Cardiac Enzymes:  Recent Labs Lab 07/29/14 1710 07/29/14 2302 07/30/14 0430  TROPONINI <0.30 <0.30 <0.30   BNP: No components found with this basename: POCBNP,  CBG: No results found for this basename: GLUCAP,  in the last 168 hours  No results found for this or any previous visit (from the past 240 hour(s)).   Scheduled Meds: . dexamethasone  4 mg Oral 4 times per day  . enoxaparin (LOVENOX) injection  40 mg Subcutaneous Q24H  . feeding supplement (ENSURE COMPLETE)  237 mL Oral TID BM  . ipratropium  0.5 mg Nebulization TID  . levalbuterol  0.63 mg Nebulization TID  . levofloxacin (LEVAQUIN)   750 mg Intravenous Q24H  . mirtazapine  15 mg Oral QHS  . montelukast  10 mg Oral QHS  . nicotine  21 mg Transdermal Daily  . OxyCODONE  120 mg Oral 3 times per day  . pantoprazole  80 mg Oral Daily  . PARoxetine  10 mg Oral q morning - 10a  . pregabalin  75 mg Oral BID  . [START ON 08/01/2014] scopolamine  1 patch Transdermal Q72H  . senna  1 tablet Oral Daily   Continuous Infusions:

## 2014-08-01 ENCOUNTER — Inpatient Hospital Stay (HOSPITAL_COMMUNITY): Payer: 59

## 2014-08-01 ENCOUNTER — Encounter (HOSPITAL_COMMUNITY): Payer: Self-pay | Admitting: Radiology

## 2014-08-01 MED ORDER — IOHEXOL 300 MG/ML  SOLN
80.0000 mL | Freq: Once | INTRAMUSCULAR | Status: AC | PRN
  Administered 2014-08-01: 80 mL via INTRAVENOUS

## 2014-08-01 MED ORDER — ALUM & MAG HYDROXIDE-SIMETH 200-200-20 MG/5ML PO SUSP
30.0000 mL | Freq: Three times a day (TID) | ORAL | Status: DC | PRN
  Administered 2014-08-01: 30 mL via ORAL
  Filled 2014-08-01: qty 30

## 2014-08-01 MED ORDER — LEVOFLOXACIN 750 MG PO TABS
750.0000 mg | ORAL_TABLET | ORAL | Status: DC
  Administered 2014-08-01: 750 mg via ORAL
  Filled 2014-08-01 (×2): qty 1

## 2014-08-01 NOTE — Progress Notes (Signed)
  Radiation Oncology         [phone] ________________________________  Name: Nicholas Soto MRN: [id number]  Date: 07/29/2014  DOB: 1951/05/29  End of Treatment Note  Diagnosis:   Metastatic carcinoma of unknown origin     Indication for treatment:  Palliative       Radiation treatment dates:  07/16/2014-07/29/2014  Site/dose:   L2-S3 to 30 Gy in 10 fractions  Beams/energy:   AP/PA beam arrangement was used with 10 and 15 MV photons  Narrative: The patient tolerated radiation treatment relatively well.   His pain was decreasing.  He unfortunately checked himself out of rehab to home and wound up back in the hospital. He was discharged and readmitted.  Plan: The patient has completed radiation treatment. The patient will return to radiation oncology clinic for routine followup in one month. I advised them to call or return sooner if they have any questions or concerns related to their recovery or treatment.  ------------------------------------------------  Thea Silversmith, MD

## 2014-08-01 NOTE — Progress Notes (Signed)
ANTIBIOTIC CONSULT NOTE - Follow up  Pharmacy Consult for Levaquin Indication: COPD exacerbation  Allergies  Allergen Reactions  . Demerol [Meperidine] Nausea Only  . Meperidine Hcl Nausea And Vomiting    Patient Measurements: Height: 5\' 9"  (175.3 cm) Weight: 164 lb (74.39 kg) IBW/kg (Calculated) : 70.7   Assessment: 63yo M with metastatic cancer recently discharged from [facility], admitted 8/3 with AMS, lethargy, fatigue, dehydration, and SOB. Pharmacy was consulted to dose Levaquin for COPD exacerbation.   Today os D4 levofloxacin  Patient remains afebrile  WBC remain WNL  Renal function stable WNL  No microbiologic data for this admission  Chest Xray performed 8/3 did not show lung infiltrate  Patient remains on dexamethasone QID for intractable cancer related pain  Goal of Therapy:  Appropriate antibiotic dosing for renal function; eradication of infection  Plan:   Continue Levaquin 750mg  q24h but change to PO formulation  Follow up renal fxn, clinical course, length of therapy  Thank you for the consult.  Currie Paris, PharmD, BCPS Pager: [phone] Pharmacy: [phone] 08/01/2014 8:03 AM

## 2014-08-01 NOTE — Care Management Note (Signed)
    Page 1 of 2   08/01/2014     3:52:52 PM CARE MANAGEMENT NOTE 08/01/2014  Patient:  Nicholas Soto   Account Number:  [id number]  Date Initiated:  07/30/2014  Documentation initiated by:  Sunday Spillers  Subjective/Objective Assessment:   63 yo male admitted with SOB, dehydration. PTA lived at home with family.     Action/Plan:   Home vs SNF   Anticipated DC Date:  08/02/2014   Anticipated DC Plan:  [place]  CM consult      [facility] Choice  DURABLE MEDICAL EQUIPMENT   Choice offered to / List presented to:  C-3 Spouse   DME arranged  [place]      DME agency  [place] arranged  [place].   Status of service:  Completed, signed off Medicare Important Message given?  NO (If response is "NO", the following Medicare IM given date fields will be blank) Date Medicare IM given:   Medicare IM given by:   Date Additional Medicare IM given:   Additional Medicare IM given by:    Discharge Disposition:  [place]  Per UR Regulation:  Reviewed for med. necessity/level of care/duration of stay  If discussed at [place] of Stay Meetings, dates discussed:    Comments:  08/01/14 12:55 CM  met with pt and wife, Nicholas Soto, [phone].  Son is also a contact, Nicholas Soto [phone]. CM offered choice of home health agency.  Nicholas Soto chooses [facility] to render HHPT services and [facility] for equipment (bed, tray-table, nebulizer machine) despite pt's O2 is from medical modalities.  CM verified address and contact number and Nicholas Soto verbalized understanding [facility] will be calling her to arrange the delivery of the aforementioned DME.  CM gave facesheet to [facility] DME rep Lucretia (with new contact numbers) and gave [facility] rep Kristen referral for HHPT.  No other CM needs were communicated.  Mariane Masters, BSN, [place].

## 2014-08-01 NOTE — Evaluation (Signed)
Physical Therapy Evaluation Patient Details Name: NGUYEN TODOROV MRN: [id number] DOB: [birth year] Today's Date: 08/01/2014   History of Present Illness  63 yo male admitted with dehydration, fatigue, weakness. Left rehab [facility] 7/31.  MRI of the lumbar spine on 07/13/14 showed widespread bony metastatic disease includin L3 vertebral body on the right side leading to epidural tumor and tumor in the right neural foramen at L2-3. There was also involvement of the S1 and S2 neural foramina by tumor and sacral epidural tumor (S2 was on the left side). CT abdomen/ pelvis showed widespread disease in multiple lymph node regions and diffuse lytic bone mets.  Clinical Impression  On eval, pt required Min assist for mobility-able to ambulate ~100 feet with rolling walker. Pain rated as 8/10 with activity. RN aware-planning to give meds end of session. Plan is for home, possibly today per pt/family. 1x eval.     Follow Up Recommendations Home health PT;Supervision/Assistance - 24 hour (pt left SNF AMA; plan is for home)    Equipment Recommendations  None recommended by PT (DME already arranged)    Recommendations for Other Services       Precautions / Restrictions Precautions Precautions: Fall Precaution Comments: R knee will buckle, increased pain with mobility Restrictions Weight Bearing Restrictions: No      Mobility  Bed Mobility Overal bed mobility: Needs Assistance Bed Mobility: Supine to Sit     Supine to sit: Min guard     General bed mobility comments: close guard for safety  Transfers Overall transfer level: Needs assistance Equipment used: Rolling walker (2 wheeled) Transfers: Sit to/from Stand Sit to Stand: Min assist         General transfer comment: assist to rise, stabillize, control descent. VCs safety, technique, hand placement  Ambulation/Gait Ambulation/Gait assistance: Min assist Ambulation Distance (Feet): 100 Feet Assistive device: Rolling walker (2  wheeled) Gait Pattern/deviations: Decreased stride length;Step-through pattern     General Gait Details: assist to stabilize. R LE buckled intermittently. Pt requesting to ambulate in hallway. pain 8/10 with activity.   Stairs            Wheelchair Mobility    Modified Rankin (Stroke Patients Only)       Balance                                             Pertinent Vitals/Pain Pain Assessment: 0-10 Pain Score: 8  Pain Location: R back/hip/LE Pain Intervention(s): Patient requesting pain meds-RN notified;Repositioned    Home Living Family/patient expects to be discharged to:: Private residence Living Arrangements: Spouse/significant other Available Help at Discharge: Family Type of Home: House Home Access: Stairs to enter   Technical brewer of Steps: 3-4   Home Equipment: None      Prior Function Level of Independence: Independent               Hand Dominance        Extremity/Trunk Assessment   Upper Extremity Assessment: Generalized weakness           Lower Extremity Assessment: Generalized weakness      Cervical / Trunk Assessment: Kyphotic  Communication   Communication: No difficulties  Cognition Arousal/Alertness: Lethargic Behavior During Therapy: Flat affect Overall Cognitive Status: Within Functional Limits for tasks assessed  General Comments      Exercises        Assessment/Plan    PT Assessment All further PT needs can be met in the next venue of care  PT Diagnosis Difficulty walking;Abnormality of gait;Generalized weakness;Acute pain   PT Problem List Decreased strength;Decreased activity tolerance;Decreased balance;Decreased mobility;Pain;Decreased knowledge of use of DME  PT Treatment Interventions     PT Goals (Current goals can be found in the Care Plan section) Acute Rehab PT Goals Patient Stated Goal: I want to walk PT Goal Formulation: No goals set, d/c  therapy    Frequency     Barriers to discharge        Co-evaluation               End of Session Equipment Utilized During Treatment: Gait belt Activity Tolerance: Patient limited by fatigue;Patient limited by pain Patient left: in bed;with call bell/phone within reach;with family/visitor present           Time: 2763-9432 PT Time Calculation (min): 22 min   Charges:   PT Evaluation $Initial PT Evaluation Tier I: 1 Procedure PT Treatments $Gait Training: 8-22 mins   PT G Codes:          Weston Anna, MPT Pager: [phone]

## 2014-08-01 NOTE — Progress Notes (Signed)
Patient ID: Nicholas Soto, male   DOB: December 16, 1951, 63 y.o.   MRN: [id number] [facility] PROGRESS NOTE  BRANDONN CAPELLI EXH:[id number] DOB: Jul 03, 1951 DOA: 07/29/2014 PCP: [name], MD  Brief narrative: 63 y.o. 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 Patient's family reported that he has had very poor by mouth intake and has been taking large amounts of pain medications to control the pain. Patient was receiving radiation treatment which is now completed and plan is for chemotherapy under Dr. [name] care.   Assessment and Plan:   Principal Problem:  Intractable cancer related pain, fatigue, weakness in the setting of widespread bony metastases of unknown primary  Patient has completed radiation therapy as planned. Dr. [name] is not in the office till 8/12. Pt will be discharged home prior to initiation of chemotherapy  Continue Decadron 4 mg by mouth every 6 hours.  Continue pain management efforts: Oxycodone 120 mg every 8 hours, oxycodone 40 mg by mouth every 3 hours for moderate pain.  Adjuvant therapy with pregabalin 75 mg twice daily, Ativan 1 mg IV every 6 hours when necessary Active Problems:  TOBACCO ABUSE  Nicotine patch ordered. Tobacco cessation counseling provided per nursing staff. Anemia of chronic disease  Likely due to malignancy. Hemoglobin is 12.9 --> 11. No current indications for transfusion. Chronic obstructive pulmonary disease / Acute respiratory failure with hypoxia / HCAP On home oxygen. Continue Atrovent and Xopenex 3 times a day. Order also placed for Xopenex every 6 hours as needed for shortness of breath or wheezing.  We will continue the scopolamine patch to help with  reducing secretions.  Levaquin ordered for pneumonia. Anxiety and depression  Continue Paxil 10 mg in the morning. Continue Ativan 1 mg IV every 6 hours as needed. Unspecified hereditary and idiopathic peripheral neuropathy  Continue lyrica 75 mg PO BID. Functional quadriplegia  Limited mobility secondary to severe pain from bone metastases.  Severe protein calorie malnutrition  Continue ensure supplementation.  DVT prophylaxis / GI prophylaxis  Lovenox ordered while inpatient. No signs of bleeding.  Protonix for GI prophylaxis since patient is on steroids.   Code Status: Full.  Family Communication: plan of care discussed with the patient's wife at the bedside  Disposition Plan: Home when stable.    IV Access:   Peripheral IV Procedures and diagnostic studies:   Dg Chest Portable 1 View 07/29/2014 IMPRESSION: No acute cardiopulmonary process. Re demonstration of mediastinal lymphadenopathy.  CT chest 08/01/2014  Medical Consultants:   Dr. Concha Norway, Oncology - not available until 8/12  Other Consultants:   Physical therapy   Anti-Infectives:   Levaquin 07/29/2014 -->     [name], MD  [facility] Pager [phone]  If 7PM-7AM, please contact night-coverage [email or web address] Password [email or web address] 08/01/2014, 7:17 AM   LOS: 3 days    HPI/Subjective: No acute overnight events.  Objective: Filed Vitals:   07/31/14 1430 07/31/14 1916 07/31/14 2024 08/01/14 0557  BP:   109/57 109/53  Pulse:   99 85  Temp:   98.2 F (36.8 C) 97.5 F (36.4 C)  TempSrc:   Oral Oral  Resp:   18 18  Height:      Weight:      SpO2: 95% 95% 98% 91%  Intake/Output Summary (Last 24 hours) at 08/01/14 0717 Last data filed at 08/01/14 0558  Gross per 24 hour  Intake    480 ml  Output   1225 ml  Net   -745 ml    Exam:   General:  Pt is alert, follows commands appropriately, not in acute distress  Cardiovascular: Regular rate and rhythm, S1/S2, no murmurs  Respiratory: gurgling  sounds appreciated, wheezing in upper lung lobes  Abdomen: Soft, non tender, non distended, bowel sounds present  Extremities: No edema, pulses DP and PT palpable bilaterally  Neuro: Grossly nonfocal  Data Reviewed: Basic Metabolic Panel:  Recent Labs Lab 07/29/14 1229 07/30/14 0430  NA 131* 130*  K 4.5 3.7  CL 94* 96  CO2 25 22  GLUCOSE 119* 110*  BUN 23 21  CREATININE 0.46* 0.43*  CALCIUM 8.8 8.3*   Liver Function Tests:  Recent Labs Lab 07/29/14 1229  AST 21  ALT 13  ALKPHOS 105  BILITOT 0.8  PROT 6.3  ALBUMIN 2.8*   No results found for this basename: LIPASE, AMYLASE,  in the last 168 hours No results found for this basename: AMMONIA,  in the last 168 hours CBC:  Recent Labs Lab 07/29/14 1229 07/30/14 0430  WBC 6.2 9.0  HGB 12.9* 11.0*  HCT 38.1* 31.3*  MCV 89.0 86.2  PLT 230 182   Cardiac Enzymes:  Recent Labs Lab 07/29/14 1710 07/29/14 2302 07/30/14 0430  TROPONINI <0.30 <0.30 <0.30   BNP: No components found with this basename: POCBNP,  CBG: No results found for this basename: GLUCAP,  in the last 168 hours  No results found for this or any previous visit (from the past 240 hour(s)).   Scheduled Meds: . dexamethason  4 mg Oral 4 times per day  . enoxaparin (LOVENOX) injection  40 mg Subcutaneous Q24H  . feeding supplement (ENSURE COMPLETE)  237 mL Oral TID BM  . ipratropium  0.5 mg Nebulization TID  . levalbuterol  0.63 mg Nebulization TID  . levofloxacin (LEVAQUIN)   750 mg Intravenous Q24H  . mirtazapine  15 mg Oral QHS  . montelukast  10 mg Oral QHS  . nicotine  21 mg Transdermal Daily  . OxyCODONE  120 mg Oral 3 times per day  . pantoprazole  80 mg Oral Daily  . PARoxetine  10 mg Oral q morning - 10a  . pregabalin  75 mg Oral BID  . scopolamine  1 patch Transdermal Q72H  . senna  1 tablet Oral Daily

## 2014-08-02 MED ORDER — LORAZEPAM 0.5 MG PO TABS: 1.0000 mg | ORAL_TABLET | Freq: Two times a day (BID) | ORAL | Status: AC | PRN

## 2014-08-02 MED ORDER — LEVOFLOXACIN 750 MG PO TABS: 750.0000 mg | ORAL_TABLET | ORAL | Status: AC

## 2014-08-02 MED ORDER — ALUM & MAG HYDROXIDE-SIMETH 200-200-20 MG/5ML PO SUSP: 30.0000 mL | Freq: Three times a day (TID) | ORAL | Status: AC | PRN

## 2014-08-02 MED ORDER — OXYCODONE HCL ER 60 MG PO T12A: 120.0000 mg | EXTENDED_RELEASE_TABLET | Freq: Three times a day (TID) | ORAL | Status: AC

## 2014-08-02 MED ORDER — OXYCODONE HCL 20 MG PO TABS: 40.0000 mg | ORAL_TABLET | ORAL | Status: DC | PRN

## 2014-08-02 NOTE — Discharge Instructions (Signed)
Bone Metastases  Cancerous growths can begin in any part of the body. The original site of cancer is called the primary tumor or primary cancer (for example, breast cancer). After cancer has developed in one area of the body, cancerous cells from that area can break away and travel through the body's bloodstream. If these cancerous cells begin growing in another place in the body, they are called metastases. Bone metastases are cancer cells that have spread to the bone (which is different from a cancer that starts in the bone).  These secondary growths are like the original tumor. For example, if a prostate cancer spreads to bone it is called metastatic prostate cancer, or prostate cancer metastatic to bone, but not bone cancer. Cancers can spread to almost any bone; the spine and pelvis are often involved.   Any type of cancer can spread to the bone, but the most common are breast, lung, kidney, thyroid and prostate cancers. Sometimes the primary tumor is not discovered until there are bone problems. If the primary cancer location cannot be discovered, the cancer is called cancer of unknown primary location.  SYMPTOMS   Pain in the bones is the main symptom of bone metastases. Some other problems may occur first including:  · Decreased appetite.  · Nausea.  · Muscle weakness.  · Confusion.  · Unusual sleep patterns due to discomfort.  · Overly tired (fatigue).  · Restlessness.  Frail or brittle bones may lead to broken bones (fractures) that lead to learning what is wrong (diagnosis). A tumor often weakens the bones.   DIAGNOSIS   Metastatic cancers may be found months or years after or at the same time as the primary tumor. When a second tumor is found in a patient who has been treated for cancer, it is more often a metastasis than another primary tumor.   The patient's symptoms, physical examination, X-rays and blood tests may suggest a bone metastases. In addition, an examination of tissue or a cell sample  (biopsy) is usually done to find the cancer. This sample is removed with a needle. This tissue sample must be looked at under a microscope to confirm a diagnosis.  TREATMENT   Options generally include treatments that give relief from symptoms (palliative) or curative. Those with advanced, metastasized cancer may receive treatment focused on pain relief and prolonging life. These treatments depend on the type of cancer and its location.   Treatment for cancer depends on its type and location. Some of these treatments are:  · Surgery to remove the original tumor and/or to remove parts of the body that produce hormones and other chemicals that make cancer worse.  · Treatment with drugs (chemotherapy).  · Bone marrow transplantations on rare occasions.  · Radiation therapy (radiotherapy).  · Hormonal therapy.  · Pain relieving medications.  Your caregiver will help you understand the likelihood that any particular treatment will be helpful for you. While some treatments aim to cure or control the cancer, others give relief from symptoms only. If you have bone metastases, radiation therapy may be recommended to treat pain (if it is in one main location). Pain medications are available. These include strong medicines like morphine. You may be instructed to take a long-acting pain medication (to control most of your pain) and a short-acting medication to control occasional flares of pain. Pain medication is sometimes also given continuously through a pump.  HOME CARE INSTRUCTIONS   · Take medications exactly as prescribed.  · Keep any   follow-up appointments.  · Pain medications can make you sleepy or confused. Do not drive, climb ladders, or do other dangerous activities while on pain medication.  · Pain medications often cause constipation. Ask your caregiver for information on stool softeners.  · Do not share your pain medication with others.  SEEK MEDICAL CARE IF:   · Your bone pain is not controlled.  · You are having  problems or side effects from your medication.  · You have excessive sleepiness or confusion.  SEEK IMMEDIATE MEDICAL CARE IF:   · You fall and have any injury or pain from the fall.  · You have trouble walking.  · You have numbness or tingling in your legs.  · You develop a sudden significant worsening of your pain.  Document Released: 12/03/2002 Document Revised: 03/06/2012 Document Reviewed: 07/26/2008  [employer]® Patient Information ©2015 [employer]. This information is not intended to replace advice given to you by your health care provider. Make sure you discuss any questions you have with your health care provider.

## 2014-08-02 NOTE — Discharge Summary (Signed)
Physician Discharge Summary  ALIF Nicholas Soto:[id number] DOB: 06-02-1951 DOA: 07/29/2014  PCP: [name], MD  Admit date: 07/29/2014 Discharge date: 08/02/2014  Recommendations for Outpatient Follow-up:  1. Follow up with Dr. [name] 8/12 per sch appt.   Discharge Diagnoses:  Active Problems:   Bone metastasis   TOBACCO ABUSE   COPD exacerbation   Dehydration    Discharge Condition: stable   Diet recommendation: as tolerated   History of present illness:  63 y.o. 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 Patient's family reported that he has had very poor by mouth intake and has been taking large amounts of pain medications to control the pain. Patient was receiving radiation treatment which is now completed and plan is for chemotherapy under Dr. [name] care.   Assessment and Plan:   Principal Problem:  Intractable cancer related pain, fatigue, weakness in the setting of widespread bony metastases of unknown primary  Patient has completed radiation therapy as planned. Dr. [name] is not in the office till 8/12. Pt will be discharged home prior to initiation of chemotherapy  Continue Decadron 4 mg by mouth every 6 hours.  Continue pain management efforts: Oxycodone 120 mg every 8 hours or if possible every 12 hours, oxycodone 40 mg by mouth every 3 hours for moderate pain.  Adjuvant therapy with pregabalin 75 mg twice daily, Ativan 1 mg IV every 6 hours when necessary Active Problems:  TOBACCO ABUSE  Nicotine patch ordered. Tobacco cessation counseling provided per nursing staff. Anemia of chronic disease  Likely due to malignancy. Hemoglobin is 12.9 --> 11. No current indications for transfusion. Chronic  obstructive pulmonary disease / Acute respiratory failure with hypoxia / HCAP  On home oxygen. Continue Atrovent and Xopenex 3 times a day. Order also placed for Xopenex every 6 hours as needed for shortness of breath or wheezing.  We will continue the scopolamine patch to help with reducing secretions.  Levaquin ordered for pneumonia. Anxiety and depression  Continue Paxil 10 mg in the morning. Continue Ativan 1 mg IV every 6 hours as needed. Unspecified hereditary and idiopathic peripheral neuropathy  Continue lyrica 75 mg PO BID. Functional quadriplegia  Limited mobility secondary to severe pain from bone metastases.  Severe protein calorie malnutrition  Continue ensure supplementation.  DVT prophylaxis / GI prophylaxis  Lovenox ordered while inpatient. No signs of bleeding.  Protonix for GI prophylaxis since patient is on steroids.   Code Status: Full.  Family Communication: plan of care discussed with the patient's wife at the bedside   IV Access:   Peripheral IV Procedures and diagnostic studies:   Dg Chest Portable 1 View 07/29/2014 IMPRESSION: No acute cardiopulmonary process. Re demonstration of mediastinal lymphadenopathy.  CT chest 08/01/2014  Medical Consultants:   Dr. Concha Norway, Oncology - not available until 8/12   Other Consultants:   Physical therapy   Anti-Infectives:   Levaquin 07/29/2014 --> continue on discharge as prescribed.     Signed:  [name], MD  [facility] 08/02/2014, 9:46 AM  Pager #: [phone]   Discharge Exam: Filed Vitals:   08/02/14 0628  BP: 114/60  Pulse: 82  Temp: 97.7 F (36.5 C)  Resp: 18   Filed Vitals:   08/01/14 2029 08/01/14 2147 08/02/14 2947  08/02/14 0940  BP: 105/52  114/60   Pulse: 86  82   Temp: 98 F (36.7 C)  97.7 F (36.5 C)   TempSrc: Oral  Oral   Resp: 18  18   Height:      Weight:      SpO2: 95% 93% 95% 93%    General: Pt is alert, follows commands appropriately, not in acute  distress Cardiovascular: Regular rate and rhythm, S1/S2 +, no murmurs Respiratory: gurgling sounds bilaterally, wheezing in upper lung lobes Abdominal: Soft, non tender, non distended, bowel sounds +, no guarding Extremities: no edema, no cyanosis, pulses palpable bilaterally DP and PT Neuro: Grossly nonfocal  Discharge Instructions  Discharge Instructions   Call MD for:  difficulty breathing, headache or visual disturbances    Complete by:  As directed      Call MD for:  persistant dizziness or light-headedness    Complete by:  As directed      Call MD for:  persistant nausea and vomiting    Complete by:  As directed      Call MD for:  severe uncontrolled pain    Complete by:  As directed      Diet - low sodium heart healthy    Complete by:  As directed      Increase activity slowly    Complete by:  As directed             Medication List         Aclidinium Bromide 400 MCG/ACT Aepb  Inhale 1 puff into the lungs 2 (two) times daily.     albuterol 108 (90 BASE) MCG/ACT inhaler  Commonly known as:  PROVENTIL HFA;VENTOLIN HFA  Inhale 2 puffs into the lungs every 6 (six) hours as needed for wheezing or shortness of breath.     alum & mag hydroxide-simeth 200-200-20 MG/5ML suspension  Commonly known as:  MAALOX/MYLANTA  Take 30 mLs by mouth every 8 (eight) hours as needed for indigestion or heartburn.     antiseptic oral rinse Liqd  15 mLs by Mouth Rinse route 2 (two) times daily.     budesonide-formoterol 160-4.5 MCG/ACT inhaler  Commonly known as:  SYMBICORT  Inhale 2 puffs into the lungs at bedtime.     dexamethasone 4 MG tablet  Commonly known as:  DECADRON  Take 1 tablet (4 mg total) by mouth every 6 (six) hours.     diphenhydrAMINE 25 MG tablet  Commonly known as:  SOMINEX  Take 25 mg by mouth at bedtime as needed for sleep.     feeding supplement (ENSURE COMPLETE) Liqd  Take 237 mLs by mouth 3 (three) times daily between meals.     ibuprofen 200 MG tablet   Commonly known as:  ADVIL,MOTRIN  Take 400 mg by mouth every 6 (six) hours as needed for moderate pain.     levalbuterol 0.63 MG/3ML nebulizer solution  Commonly known as:  XOPENEX  Take 3 mLs (0.63 mg total) by nebulization every 6 (six) hours as needed for wheezing or shortness of breath.     levofloxacin 750 MG tablet  Commonly known as:  LEVAQUIN  Take 1 tablet (750 mg total) by mouth daily.     LORazepam 0.5 MG tablet  Commonly known as:  ATIVAN  Take 2 tablets (1 mg total) by mouth 2 (two) times daily as needed for anxiety.     mirtazapine 15 MG tablet  Commonly known as:  REMERON  Take 1 tablet (  15 mg total) by mouth at bedtime.     montelukast 10 MG tablet  Commonly known as:  SINGULAIR  Take 10 mg by mouth every morning.     nicotine 21 mg/24hr patch  Commonly known as:  NICODERM CQ - dosed in mg/24 hours  Place 1 patch (21 mg total) onto the skin daily.     omeprazole 20 MG capsule  Commonly known as:  PRILOSEC  Take 20 mg by mouth every morning.     ondansetron 4 MG tablet  Commonly known as:  ZOFRAN  Take 1 tablet (4 mg total) by mouth every 6 (six) hours as needed for nausea.     OxyCODONE HCl ER 60 MG T12a  Take 120 mg by mouth every 8 (eight) hours.     Oxycodone HCl 20 MG Tabs  Take 2-3 tablets (40-60 mg total) by mouth every 3 (three) hours as needed.     pantoprazole 40 MG tablet  Commonly known as:  PROTONIX  Take 40 mg by mouth every morning.     PARoxetine 10 MG tablet  Commonly known as:  PAXIL  Take 10 mg by mouth every morning.     polyethylene glycol packet  Commonly known as:  MIRALAX / GLYCOLAX  Take 17 g by mouth daily as needed for mild constipation.     pregabalin 75 MG capsule  Commonly known as:  LYRICA  Take 1 capsule (75 mg total) by mouth 2 (two) times daily.     promethazine 25 MG tablet  Commonly known as:  PHENERGAN  Take 25 mg by mouth every 8 (eight) hours as needed for nausea or vomiting.     scopolamine 1  MG/3DAYS  Commonly known as:  TRANSDERM-SCOP  Place 1 patch (1.5 mg total) onto the skin every 3 (three) days.     senna 8.6 MG Tabs tablet  Commonly known as:  SENOKOT  Take 1 tablet (8.6 mg total) by mouth daily.           Follow-up Information   Follow up with [facility]. (hospital bed and table with tray and nebulizer machine)    Contact information:   [address] [place] [address] [phone]       Follow up with [facility]. (home health physical therapy)    Contact information:   [address] [place] [address] [phone]       Follow up with [facility], DAVID, MD.   Specialty:  Internal Medicine   Contact information:   [place] [address] [phone]        The results of significant diagnostics from this hospitalization (including imaging, microbiology, ancillary and laboratory) are listed below for reference.    Significant Diagnostic Studies: Dg Lumbar Spine Complete  07/08/2014   CLINICAL DATA:  63 year old male with low back pain radiating into both hips and legs. History of remote back surgery.  EXAM: LUMBAR SPINE - COMPLETE 4+ VIEW  COMPARISON:  05/30/2014 radiographs.  04/10/2008 MR  FINDINGS: Normal alignment noted.  Mild multilevel degenerative disc disease again identified.  Mild to moderate facet arthropathy in the mid and lower lumbar spine is unchanged.  There is no evidence of acute fracture or subluxation.  No focal bony lesions are noted.  Postsurgical changes in the posterior lower lumbar spine identified.  IMPRESSION: No evidence of acute abnormality.  Mild multilevel degenerative disc disease and mild to moderate facet arthropathy.  Electronically Signed   By: Hassan Rowan M.D.   On: 07/08/2014 11:05   Ct Chest W Contrast  08/01/2014   CLINICAL DATA:  Metastatic carcinoma. Wheezing, shortness of breath and abnormal breath sounds.  EXAM: CT CHEST WITH CONTRAST  TECHNIQUE:  Multidetector CT imaging of the chest was performed during intravenous contrast administration.  CONTRAST:  62mL OMNIPAQUE IOHEXOL 300 MG/ML  SOLN  COMPARISON:  07/13/2014  FINDINGS: Since the prior chest CT, the patient has developed an acute pneumonia within the posterior left lower lobe. Associated trace amount of adjacent pleural fluid is seen.  Since the prior CT, there is mild progression of diffuse metastatic lymphadenopathy in the right lower neck, mediastinum, right hilum and both axillary regions. When obtaining similar measurements, all of the metastatic lymph nodes appear mildly enlarged. A right chest wall metastasis also appears mildly enlarged. There is stable narrowing of the left brachycephalic vein and upper SVC by lymphadenopathy without venous occlusion.  Bony lesions involving the spine appear relatively stable. Multiple peritoneal metastatic nodules are again visualized in the upper abdomen.  IMPRESSION: 1. Development of new acute pneumonia in the posterior left lower lobe with associated trace amount of adjacent pleural fluid. 2. Mild progression of diffuse metastatic lymphadenopathy in the chest with all of the lymph node showing slightly larger dimensions. A right chest wall metastasis also appears mildly enlarged.   Electronically Signed   By: Aletta Edouard M.D.   On: 08/01/2014 12:23   Ct Chest W Contrast  07/13/2014   CLINICAL DATA:  Bone metastases. Unknown primary. Past history of ocular melanoma.  EXAM: CT CHEST, ABDOMEN, AND PELVIS WITH CONTRAST  TECHNIQUE: Multidetector CT imaging of the chest, abdomen and pelvis was performed following the standard protocol during bolus administration of intravenous contrast.  CONTRAST:  170mL OMNIPAQUE IOHEXOL 300 MG/ML  SOLN  COMPARISON:  Chest CT on 11/10/2011  FINDINGS: CT CHEST FINDINGS  Bulky mediastinal lymphadenopathy is seen in the superior mediastinum, prevascular space, right paratracheal region, lateral aortic, and subcarinal  regions. Largest index area of lymphadenopathy in the right paratracheal region measures 4.9 x 5.9 cm on image 18, and there is compression of the left brachycephalic vein without thrombosis.  Mild lymphadenopathy is also seen in the right lower jugular chain and supraclavicular regions as well as the right axilla. There is also mild adenopathy in the right and left pericardial spaces. , consistent metastatic disease.  A 7 mm pulmonary nodule is seen in the right middle lobe on image 34 and a 4 mm nodule is seen in the anterior left upper lobe on image 23. These nodules were not seen on previous study and tiny pulmonary metastases cannot be excluded. A 4 mm pulmonary nodule in the left lower lobe on image 45 is stable since previous study in 2012 and likely benign.  No evidence of pleural effusion although small subpleural metastases measuring up to 1 cm are seen bilaterally on images 29 and 28. In addition, there are small metastases seen in the subcutaneous tissues of the chest wall bilaterally. Several lytic bone metastases are seen involving the thoracic spine and right lateral sixth rib.  CT ABDOMEN AND PELVIS FINDINGS  Benign appearing cyst again seen in the posterior right hepatic lobe as well as a few other tiny sub-cm cyst which appears stable. No definite liver metastases are seen. There are several small ill-defined low-attenuation lesions in the spleen, suspicious for splenic metastases. Kidneys and pancreas are unremarkable in appearance. No evidence of hydronephrosis.  Bilateral retrocrural lymphadenopathy demonstrated, consistent metastatic disease. There are numerous soft tissue nodules throughout the mesenteric and retroperitoneal fat throughout the abdomen pelvis, consistent with diffuse metastatic disease. Largest in the right posterior para renal space measures 4.6 x 6.0 cm on image 88.  There are also numerous metastatic soft tissue nodule seen throughout the abdominal and pelvic wall soft  tissues bilaterally.  Numerous lytic bone metastases are seen involving the lumbar spine, pelvis, and hips.  No evidence of bowel obstruction. No evidence of inflammatory process, abscess, or ascites.  IMPRESSION: Bulky mediastinal lymphadenopathy with mild lymphadenopathy also seen in the right axilla, bilateral pericardial spaces, and retrocrural regions, consistent metastatic disease.  Diffuse body wall soft tissue metastases throughout the chest, abdomen, and pelvis.  Diffuse mesenteric and retroperitoneal metastases throughout the abdomen and pelvis.  Diffuse lytic bone metastases.  Probable small splenic metastases.   Electronically Signed   By: Earle Gell M.D.   On: 07/13/2014 15:20   Mr Lumbar Spine W Wo Contrast  07/13/2014   CLINICAL DATA:  Low back pain radiating to the right thigh and knee.  EXAM: MRI LUMBAR SPINE WITHOUT AND WITH CONTRAST  TECHNIQUE: Multiplanar and multiecho pulse sequences of the lumbar spine were obtained without and with intravenous contrast.  CONTRAST:  61m MULTIHANCE GADOBENATE DIMEGLUMINE 529 MG/ML IV SOLN  COMPARISON:  07/08/2014  FINDINGS: Unfortunately there are numerous masses scattered in the lumbar spine, sacrum, retroperitoneum, perirenal spaces, paraspinal musculature, and even the subcutaneous tissues highly concerning for a diffusely metastatic process. Index lesions include a 3.9 cm mass along the right mid kidney medially which may or may not be arising from the kidney and a right paraspinal mass at the L3 level which measures 4.2 cm in AP dimension along the deep margin of the psoas muscle.  The bony metastatic lesions are observed involving T11, T12, L2, L3, and L4, as well as the S1 and S2 levels of the sacrum. The L3 level demonstrates cortical breakthrough along the posterior vertebral margin eccentric to the right and extending into the right pedicle, with right epidural tumor and tumor extending in the inferior aspect of the right neural foramen. The right  S1 tumor partial extends into the right S1 neural foramen and epidural space, surrounding the S1 nerve roots. The S2 tumor extends into the left S2 neural foramen and epidural space, adjacent to the left S2 nerve roots.  There is a large cystic lesion in the right hepatic lobe posteriorly.  Despite efforts by the technologist and patient, motion artifact is present on today's exam and could not be eliminated. This reduces exam sensitivity and specificity. Additional findings at individual levels are as follows:  T11-12: Mild right eccentric central narrowing of the thecal sac due to a right paracentral disc protrusion.  T12-L1:  Unremarkable.  L1-2: No impingement. Tumor observed in the left L2 posterior elements.  L2-3: Tumor extends inferiorly in the right neural foramen and surrounds the right L2 nerve roots and spinal nerve. Right eccentric epidural tumor arising from L3. Tumor in the right lateral recess.  L3-4: Moderate right foraminal stenosis due to spurring, disc bulge, and facet arthropathy. Mild central narrowing of the thecal sac.  L4-5: Mild bilateral subarticular lateral recess stenosis due to facet arthropathy. Prior laminectomies. Borderline left foraminal stenosis due to facet and intervertebral spurring.  L5-S1:  No impingement.  Prior laminectomies at L5.  IMPRESSION: 1. Unfortunately there are enhancing tumors in most of the lumbar vertebra, the sacrum, paraspinal tissues, subcutaneous  tissues, retroperitoneum, and perirenal spaces most compatible with a diffuse metastatic process. Oncology referral with staging workup to determine a suitable biopsy site recommended. 2. Cortical breakthrough from the L3 vertebral body on the right side leads to epidural tumor and tumor in the right neural foramen at L2-3. There is also involvement of the S1 and S2 neural foramina by tumor and sacral epidural tumor as noted above. 3. Spondylosis and degenerative disc disease also cause moderate impingement at L3-4  and mild impingement at T11-12 and L4-5, as noted above. These results were called by telephone at the time of interpretation on 07/13/2014 at 10:00 am to Dr. Reather Converse, who verbally acknowledged these results.   Electronically Signed   By: Sherryl Barters M.D.   On: 07/13/2014 10:07   Ct Abdomen Pelvis W Contrast  07/13/2014   CLINICAL DATA:  Bone metastases. Unknown primary. Past history of ocular melanoma.  EXAM: CT CHEST, ABDOMEN, AND PELVIS WITH CONTRAST  TECHNIQUE: Multidetector CT imaging of the chest, abdomen and pelvis was performed following the standard protocol during bolus administration of intravenous contrast.  CONTRAST:  150m OMNIPAQUE IOHEXOL 300 MG/ML  SOLN  COMPARISON:  Chest CT on 11/10/2011  FINDINGS: CT CHEST FINDINGS  Bulky mediastinal lymphadenopathy is seen in the superior mediastinum, prevascular space, right paratracheal region, lateral aortic, and subcarinal regions. Largest index area of lymphadenopathy in the right paratracheal region measures 4.9 x 5.9 cm on image 18, and there is compression of the left brachycephalic vein without thrombosis.  Mild lymphadenopathy is also seen in the right lower jugular chain and supraclavicular regions as well as the right axilla. There is also mild adenopathy in the right and left pericardial spaces. , consistent metastatic disease.  A 7 mm pulmonary nodule is seen in the right middle lobe on image 34 and a 4 mm nodule is seen in the anterior left upper lobe on image 23. These nodules were not seen on previous study and tiny pulmonary metastases cannot be excluded. A 4 mm pulmonary nodule in the left lower lobe on image 45 is stable since previous study in 2012 and likely benign.  No evidence of pleural effusion although small subpleural metastases measuring up to 1 cm are seen bilaterally on images 29 and 28. In addition, there are small metastases seen in the subcutaneous tissues of the chest wall bilaterally. Several lytic bone metastases are  seen involving the thoracic spine and right lateral sixth rib.  CT ABDOMEN AND PELVIS FINDINGS  Benign appearing cyst again seen in the posterior right hepatic lobe as well as a few other tiny sub-cm cyst which appears stable. No definite liver metastases are seen. There are several small ill-defined low-attenuation lesions in the spleen, suspicious for splenic metastases. Kidneys and pancreas are unremarkable in appearance. No evidence of hydronephrosis.  Bilateral retrocrural lymphadenopathy demonstrated, consistent metastatic disease. There are numerous soft tissue nodules throughout the mesenteric and retroperitoneal fat throughout the abdomen pelvis, consistent with diffuse metastatic disease. Largest in the right posterior para renal space measures 4.6 x 6.0 cm on image 88.  There are also numerous metastatic soft tissue nodule seen throughout the abdominal and pelvic wall soft tissues bilaterally.  Numerous lytic bone metastases are seen involving the lumbar spine, pelvis, and hips.  No evidence of bowel obstruction. No evidence of inflammatory process, abscess, or ascites.  IMPRESSION: Bulky mediastinal lymphadenopathy with mild lymphadenopathy also seen in the right axilla, bilateral pericardial spaces, and retrocrural regions, consistent metastatic disease.  Diffuse body wall  soft tissue metastases throughout the chest, abdomen, and pelvis.  Diffuse mesenteric and retroperitoneal metastases throughout the abdomen and pelvis.  Diffuse lytic bone metastases.  Probable small splenic metastases.   Electronically Signed   By: Earle Gell M.D.   On: 07/13/2014 15:20   Ct Biopsy  07/22/2014   CLINICAL DATA:  Retroperitoneal mass. Previous biopsy positive. Additional material needed for molecular profiling.  EXAM: CT GUIDED CORE BIOPSY OF RIGHT RETROPERITONEAL MASS  ANESTHESIA/SEDATION: Intravenous Fentanyl and Versed were administered as conscious sedation during continuous cardiorespiratory monitoring by the  radiology RN, with a total moderate sedation time of less than 30 minutes.  PROCEDURE: The procedure risks, benefits, and alternatives were explained to the patient. Questions regarding the procedure were encouraged and answered. The patient understands and consents to the procedure.  Select axial scans through the mid abdomen were obtained, the dominant right retroperitoneal mass was localized, and an appropriate skin entry site was localized.  The operative field was prepped with Betadinein a sterile fashion, and a sterile drape was applied covering the operative field. A sterile gown and sterile gloves were used for the procedure. Local anesthesia was provided with 1% Lidocaine.  Under CT fluoroscopic guidance, a 17 gauge trocar needle was advanced to the margin of the lesion. Once needle tip position was confirmed, coaxial 18-gauge core biopsy samples were obtained, submitted in formalin to surgical pathology. The guide needle was removed. Postprocedure scans show no hemorrhage or other apparent complication. The patient tolerated the procedure well.  Complications: None immediate  FINDINGS: The dominant right retroperitoneal mass was again identified. Percutaneous core biopsies were obtained under CT guidance.  IMPRESSION: 1. Technically successful CT-guided core biopsy of right retroperitoneal mass.   Electronically Signed   By: Arne Cleveland M.D.   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 However, he presents with back and knee pain and is going to have widespread bony and soft tissue metastatic disease. Additionally, he has a history of psoriatic arthritis and is on Enbrel. CT-guided biopsy is warranted to confirm tissue diagnosis.  EXAM: CT BIOPSY  Date: 07/15/2014  PROCEDURE: 1. CT-guided biopsy of right retroperitoneal soft tissue mass Interventional Radiologist:  Criselda Peaches, MD  ANESTHESIA/SEDATION: Moderate (conscious) sedation was used. 6 mg Versed, 200 mcg Fentanyl were administered intravenously. The patient's vital signs were monitored continuously by radiology nursing throughout the procedure.  Sedation Time: 22 minutes  TECHNIQUE: Informed consent was obtained from the patient following explanation of the procedure, risks, benefits and alternatives. The patient understands, agrees and consents for the procedure. All questions were addressed. A time out was performed.  A planning axial CT scan was performed with the patient in the prone position. A suitable skin entry site was selected and marked. The patient's right back and flank were then sterilely prepped and draped in the usual fashion using Betadine skin prep.  However, due to extreme patient discomfort from his right knee, he was repositioned into the left lateral decubitus position and Re imaged. A new skin entry site was selected and marked. Local anesthesia was attained by infiltration with 1% lidocaine. A small dermatotomy was made. Under intermittent CT fluoroscopic guidance, a 17 gauge trocar needle was advanced into the margin of the mass. Multiple 18 gauge core biopsies were then coaxially obtained. Biopsy specimens were placed in saline to allow  for flow cytometry if needed.  The biopsy device was removed. Additional CT fluoroscopic imaging demonstrates no evidence of acute complication. The patient tolerated the procedure itself well, although positioning was difficult due to his extremely painful metastatic disease.  IMPRESSION: Technically successful CT-guided core biopsy of right retroperitoneal soft tissue mass.  Signed,  Criselda Peaches, MD  [facility]  [facility]   Electronically Signed   By: Jacqulynn Cadet M.D.   On: 07/15/2014 11:43   Dg Chest Portable 1 View  07/29/2014   CLINICAL DATA:  Fatigue, history of metastatic disease.  EXAM:  PORTABLE CHEST - 1 VIEW  COMPARISON:  Chest radiograph April 23, 2014 and CT of the chest July 13, 2014  FINDINGS: Cardiac silhouette is unremarkable. Widened superior mediastinum corresponding to known lymphadenopathy. No pleural effusions. No focal consolidation. No pneumothorax. Soft tissue planes and included osseous structures are unchanged.  IMPRESSION: No acute cardiopulmonary process.  Re demonstration of mediastinal lymphadenopathy.   Electronically Signed   By: Elon Alas   On: 07/29/2014 12:37   Dg Shoulder Left Port  07/17/2014   CLINICAL DATA:  Bilateral shoulder pain. History of multiple myeloma.  EXAM: PORTABLE LEFT SHOULDER - 2+ VIEW  COMPARISON:  Chest radiograph 04/23/2014  FINDINGS: Calcifications in the left lower neck are consistent with carotid artery calcifications. The left shoulder is located without a fracture. No suspicious lucent lesions in the bones. The left acromial-humeral interval appears narrowed on these images but could be related to projection.  IMPRESSION: No acute bone abnormality to the left shoulder.  Difficult to exclude underlying rotator cuff disease.  Evidence for carotid artery calcifications.   Electronically Signed   By: Markus Daft M.D.   On: 07/17/2014 10:33   Dg Shoulder Right Port  07/17/2014   CLINICAL DATA:  Bilateral shoulder pain.  EXAM: PORTABLE RIGHT SHOULDER - 2+ VIEW  COMPARISON:  Chest radiograph 04/23/2014 and chest CT 07/13/2014  FINDINGS: Right shoulder is located without a fracture. Visualized right ribs are intact. No suspicious lucent bone lesions. Right AC joint is intact. Soft tissue fullness along the right side of the mediastinum is compatible with known lymphadenopathy.  IMPRESSION: No acute bone abnormality to the right shoulder.  Mediastinal fullness is consistent with known mediastinal lymphadenopathy.   Electronically Signed   By: Markus Daft M.D.   On: 07/17/2014 10:36    Microbiology: No results found for this or any  previous visit (from the past 240 hour(s)).   Labs: Basic Metabolic Panel:  Recent Labs Lab 07/29/14 1229 07/30/14 0430  NA 131* 130*  K 4.5 3.7  CL 94* 96  CO2 25 22  GLUCOSE 119* 110*  BUN 23 21  CREATININE 0.46* 0.43*  CALCIUM 8.8 8.3*   Liver Function Tests:  Recent Labs Lab 07/29/14 1229  AST 21  ALT 13  ALKPHOS 105  BILITOT 0.8  PROT 6.3  ALBUMIN 2.8*   No results found for this basename: LIPASE, AMYLASE,  in the last 168 hours No results found for this basename: AMMONIA,  in the last 168 hours CBC:  Recent Labs Lab 07/29/14 1229 07/30/14 0430  WBC 6.2 9.0  HGB 12.9* 11.0*  HCT 38.1* 31.3*  MCV 89.0 86.2  PLT 230 182   Cardiac Enzymes:  Recent Labs Lab 07/29/14 1710 07/29/14 2302 07/30/14 0430  TROPONINI <0.30 <0.30 <0.30   BNP: BNP (last 3 results) No results found for this basename: PROBNP,  in the last 8760  hours CBG: No results found for this basename: GLUCAP,  in the last 168 hours  Time coordinating discharge: Over 30 minutes

## 2014-08-05 ENCOUNTER — Inpatient Hospital Stay (HOSPITAL_COMMUNITY)
Admission: EM | Admit: 2014-08-05 | Discharge: 2014-08-27 | DRG: 542 | Disposition: E | Payer: 59 | Attending: Internal Medicine | Admitting: Internal Medicine

## 2014-08-05 ENCOUNTER — Telehealth: Payer: Self-pay | Admitting: *Deleted

## 2014-08-05 ENCOUNTER — Encounter (HOSPITAL_COMMUNITY): Payer: Self-pay | Admitting: Emergency Medicine

## 2014-08-05 ENCOUNTER — Other Ambulatory Visit: Payer: Self-pay | Admitting: Medical Oncology

## 2014-08-05 ENCOUNTER — Telehealth: Payer: Self-pay | Admitting: Medical Oncology

## 2014-08-05 ENCOUNTER — Emergency Department (HOSPITAL_COMMUNITY): Payer: 59

## 2014-08-05 DIAGNOSIS — Z66 Do not resuscitate: Secondary | ICD-10-CM | POA: Diagnosis present

## 2014-08-05 DIAGNOSIS — D630 Anemia in neoplastic disease: Secondary | ICD-10-CM | POA: Diagnosis present

## 2014-08-05 DIAGNOSIS — C7951 Secondary malignant neoplasm of bone: Secondary | ICD-10-CM

## 2014-08-05 DIAGNOSIS — E860 Dehydration: Secondary | ICD-10-CM | POA: Diagnosis present

## 2014-08-05 DIAGNOSIS — G608 Other hereditary and idiopathic neuropathies: Secondary | ICD-10-CM | POA: Diagnosis present

## 2014-08-05 DIAGNOSIS — J441 Chronic obstructive pulmonary disease with (acute) exacerbation: Secondary | ICD-10-CM | POA: Diagnosis present

## 2014-08-05 DIAGNOSIS — E43 Unspecified severe protein-calorie malnutrition: Secondary | ICD-10-CM | POA: Diagnosis present

## 2014-08-05 DIAGNOSIS — E785 Hyperlipidemia, unspecified: Secondary | ICD-10-CM | POA: Diagnosis present

## 2014-08-05 DIAGNOSIS — F329 Major depressive disorder, single episode, unspecified: Secondary | ICD-10-CM | POA: Diagnosis present

## 2014-08-05 DIAGNOSIS — Z9981 Dependence on supplemental oxygen: Secondary | ICD-10-CM

## 2014-08-05 DIAGNOSIS — Z8249 Family history of ischemic heart disease and other diseases of the circulatory system: Secondary | ICD-10-CM

## 2014-08-05 DIAGNOSIS — R5381 Other malaise: Secondary | ICD-10-CM | POA: Diagnosis present

## 2014-08-05 DIAGNOSIS — Z923 Personal history of irradiation: Secondary | ICD-10-CM | POA: Diagnosis not present

## 2014-08-05 DIAGNOSIS — Z85820 Personal history of malignant melanoma of skin: Secondary | ICD-10-CM

## 2014-08-05 DIAGNOSIS — IMO0002 Reserved for concepts with insufficient information to code with codable children: Secondary | ICD-10-CM | POA: Diagnosis not present

## 2014-08-05 DIAGNOSIS — G5790 Unspecified mononeuropathy of unspecified lower limb: Secondary | ICD-10-CM | POA: Diagnosis present

## 2014-08-05 DIAGNOSIS — R1310 Dysphagia, unspecified: Secondary | ICD-10-CM | POA: Diagnosis present

## 2014-08-05 DIAGNOSIS — Z833 Family history of diabetes mellitus: Secondary | ICD-10-CM | POA: Diagnosis not present

## 2014-08-05 DIAGNOSIS — R532 Functional quadriplegia: Secondary | ICD-10-CM | POA: Diagnosis present

## 2014-08-05 DIAGNOSIS — R627 Adult failure to thrive: Secondary | ICD-10-CM | POA: Diagnosis present

## 2014-08-05 DIAGNOSIS — J9600 Acute respiratory failure, unspecified whether with hypoxia or hypercapnia: Secondary | ICD-10-CM | POA: Diagnosis present

## 2014-08-05 DIAGNOSIS — D696 Thrombocytopenia, unspecified: Secondary | ICD-10-CM | POA: Diagnosis present

## 2014-08-05 DIAGNOSIS — K117 Disturbances of salivary secretion: Secondary | ICD-10-CM

## 2014-08-05 DIAGNOSIS — C7952 Secondary malignant neoplasm of bone marrow: Principal | ICD-10-CM

## 2014-08-05 DIAGNOSIS — F411 Generalized anxiety disorder: Secondary | ICD-10-CM | POA: Diagnosis present

## 2014-08-05 DIAGNOSIS — F3289 Other specified depressive episodes: Secondary | ICD-10-CM | POA: Diagnosis present

## 2014-08-05 DIAGNOSIS — Z515 Encounter for palliative care: Secondary | ICD-10-CM | POA: Diagnosis not present

## 2014-08-05 DIAGNOSIS — F17200 Nicotine dependence, unspecified, uncomplicated: Secondary | ICD-10-CM | POA: Diagnosis present

## 2014-08-05 DIAGNOSIS — C801 Malignant (primary) neoplasm, unspecified: Secondary | ICD-10-CM | POA: Diagnosis present

## 2014-08-05 DIAGNOSIS — I10 Essential (primary) hypertension: Secondary | ICD-10-CM | POA: Diagnosis present

## 2014-08-05 DIAGNOSIS — I2510 Atherosclerotic heart disease of native coronary artery without angina pectoris: Secondary | ICD-10-CM | POA: Diagnosis present

## 2014-08-05 DIAGNOSIS — G893 Neoplasm related pain (acute) (chronic): Secondary | ICD-10-CM | POA: Diagnosis present

## 2014-08-05 DIAGNOSIS — M549 Dorsalgia, unspecified: Secondary | ICD-10-CM

## 2014-08-05 DIAGNOSIS — R5383 Other fatigue: Secondary | ICD-10-CM | POA: Diagnosis not present

## 2014-08-05 DIAGNOSIS — D492 Neoplasm of unspecified behavior of bone, soft tissue, and skin: Secondary | ICD-10-CM

## 2014-08-05 DIAGNOSIS — C799 Secondary malignant neoplasm of unspecified site: Secondary | ICD-10-CM

## 2014-08-05 DIAGNOSIS — R52 Pain, unspecified: Secondary | ICD-10-CM | POA: Diagnosis present

## 2014-08-05 LAB — CBC WITH DIFFERENTIAL/PLATELET
Basophils Absolute: 0 10*3/uL (ref 0.0–0.1)
Basophils Relative: 0 % (ref 0–1)
Eosinophils Absolute: 0 10*3/uL (ref 0.0–0.7)
Eosinophils Relative: 0 % (ref 0–5)
HCT: 33.4 % — ABNORMAL LOW (ref 39.0–52.0)
Hemoglobin: 11.4 g/dL — ABNORMAL LOW (ref 13.0–17.0)
Lymphocytes Relative: 3 % — ABNORMAL LOW (ref 12–46)
Lymphs Abs: 0.2 10*3/uL — ABNORMAL LOW (ref 0.7–4.0)
MCH: 29.7 pg (ref 26.0–34.0)
MCHC: 34.1 g/dL (ref 30.0–36.0)
MCV: 87 fL (ref 78.0–100.0)
Monocytes Absolute: 0.3 10*3/uL (ref 0.1–1.0)
Monocytes Relative: 5 % (ref 3–12)
Neutro Abs: 5.8 10*3/uL (ref 1.7–7.7)
Neutrophils Relative %: 92 % — ABNORMAL HIGH (ref 43–77)
Platelets: 127 10*3/uL — ABNORMAL LOW (ref 150–400)
RBC: 3.84 MIL/uL — ABNORMAL LOW (ref 4.22–5.81)
RDW: 14.3 % (ref 11.5–15.5)
WBC: 6.3 10*3/uL (ref 4.0–10.5)

## 2014-08-05 LAB — BASIC METABOLIC PANEL
Anion gap: 12 (ref 5–15)
BUN: 24 mg/dL — ABNORMAL HIGH (ref 6–23)
CO2: 28 mEq/L (ref 19–32)
Calcium: 8.2 mg/dL — ABNORMAL LOW (ref 8.4–10.5)
Chloride: 91 mEq/L — ABNORMAL LOW (ref 96–112)
Creatinine, Ser: 0.46 mg/dL — ABNORMAL LOW (ref 0.50–1.35)
GFR calc Af Amer: >90 mL/min (ref >90)
GFR calc non Af Amer: >90 mL/min (ref >90)
Glucose, Bld: 91 mg/dL (ref 70–99)
Potassium: 4.6 mEq/L (ref 3.7–5.3)
Sodium: 131 mEq/L — ABNORMAL LOW (ref 137–147)

## 2014-08-05 LAB — PHOSPHORUS: Phosphorus: 5.1 mg/dL — ABNORMAL HIGH (ref 2.3–4.6)

## 2014-08-05 LAB — MAGNESIUM: Magnesium: 2.1 mg/dL (ref 1.5–2.5)

## 2014-08-05 MED ORDER — PANTOPRAZOLE SODIUM 40 MG PO TBEC
40.0000 mg | DELAYED_RELEASE_TABLET | Freq: Every day | ORAL | Status: DC
  Administered 2014-08-05 – 2014-08-07 (×3): 40 mg via ORAL
  Filled 2014-08-05 (×4): qty 1

## 2014-08-05 MED ORDER — PAROXETINE HCL 10 MG PO TABS
10.0000 mg | ORAL_TABLET | Freq: Every morning | ORAL | Status: DC
  Administered 2014-08-06 – 2014-08-07 (×2): 10 mg via ORAL
  Filled 2014-08-05 (×2): qty 1

## 2014-08-05 MED ORDER — SODIUM CHLORIDE 0.9 % IJ SOLN
3.0000 mL | Freq: Two times a day (BID) | INTRAMUSCULAR | Status: DC
  Administered 2014-08-05 – 2014-08-08 (×3): 3 mL via INTRAVENOUS

## 2014-08-05 MED ORDER — HYDROMORPHONE HCL PF 1 MG/ML IJ SOLN
1.0000 mg | Freq: Once | INTRAMUSCULAR | Status: AC
  Administered 2014-08-05: 1 mg via INTRAVENOUS
  Filled 2014-08-05: qty 1

## 2014-08-05 MED ORDER — SODIUM CHLORIDE 0.9 % IV SOLN
INTRAVENOUS | Status: DC
  Administered 2014-08-05: 18:00:00 via INTRAVENOUS

## 2014-08-05 MED ORDER — MIRTAZAPINE 15 MG PO TABS
15.0000 mg | ORAL_TABLET | Freq: Every day | ORAL | Status: DC
  Administered 2014-08-05 – 2014-08-06 (×2): 15 mg via ORAL
  Filled 2014-08-05 (×3): qty 1

## 2014-08-05 MED ORDER — ENSURE PUDDING PO PUDG: 1.0000 | Freq: Three times a day (TID) | ORAL | Status: DC

## 2014-08-05 MED ORDER — NICOTINE 21 MG/24HR TD PT24
21.0000 mg | MEDICATED_PATCH | Freq: Every day | TRANSDERMAL | Status: DC
  Administered 2014-08-05 – 2014-08-08 (×4): 21 mg via TRANSDERMAL
  Filled 2014-08-05 (×6): qty 1

## 2014-08-05 MED ORDER — NITROGLYCERIN 0.4 MG SL SUBL: 0.4000 mg | SUBLINGUAL_TABLET | SUBLINGUAL | Status: DC | PRN

## 2014-08-05 MED ORDER — LORAZEPAM 1 MG PO TABS
1.0000 mg | ORAL_TABLET | Freq: Two times a day (BID) | ORAL | Status: DC | PRN
Start: 2014-08-05 — End: 2014-08-06

## 2014-08-05 MED ORDER — TIOTROPIUM BROMIDE MONOHYDRATE 18 MCG IN CAPS
18.0000 ug | ORAL_CAPSULE | Freq: Every day | RESPIRATORY_TRACT | Status: DC
  Administered 2014-08-06: 18 ug via RESPIRATORY_TRACT
  Filled 2014-08-05: qty 5

## 2014-08-05 MED ORDER — SCOPOLAMINE 1 MG/3DAYS TD PT72
1.0000 | MEDICATED_PATCH | TRANSDERMAL | Status: DC
  Administered 2014-08-07: 1.5 mg via TRANSDERMAL
  Filled 2014-08-05 (×2): qty 1

## 2014-08-05 MED ORDER — PROMETHAZINE HCL 25 MG PO TABS: 25.0000 mg | ORAL_TABLET | Freq: Three times a day (TID) | ORAL | Status: DC | PRN

## 2014-08-05 MED ORDER — DEXAMETHASONE 4 MG PO TABS
4.0000 mg | ORAL_TABLET | Freq: Four times a day (QID) | ORAL | Status: DC
  Administered 2014-08-05 – 2014-08-08 (×9): 4 mg via ORAL
  Filled 2014-08-05 (×22): qty 1

## 2014-08-05 MED ORDER — SODIUM CHLORIDE 0.9 % IV SOLN: INTRAVENOUS | Status: DC

## 2014-08-05 MED ORDER — ONDANSETRON HCL 4 MG PO TABS: 4.0000 mg | ORAL_TABLET | Freq: Four times a day (QID) | ORAL | Status: DC | PRN

## 2014-08-05 MED ORDER — PREGABALIN 75 MG PO CAPS
75.0000 mg | ORAL_CAPSULE | Freq: Two times a day (BID) | ORAL | Status: DC
  Administered 2014-08-05 – 2014-08-07 (×4): 75 mg via ORAL
  Filled 2014-08-05 (×5): qty 1

## 2014-08-05 MED ORDER — ALBUTEROL SULFATE (2.5 MG/3ML) 0.083% IN NEBU: 3.0000 mL | INHALATION_SOLUTION | Freq: Four times a day (QID) | RESPIRATORY_TRACT | Status: DC | PRN

## 2014-08-05 MED ORDER — OXYCODONE HCL 20 MG PO TABS
2.0000 | ORAL_TABLET | ORAL | Status: DC | PRN
  Filled 2014-08-05: qty 3

## 2014-08-05 MED ORDER — SENNA 8.6 MG PO TABS
1.0000 | ORAL_TABLET | Freq: Every day | ORAL | Status: DC
  Administered 2014-08-05 – 2014-08-06 (×2): 8.6 mg via ORAL
  Filled 2014-08-05 (×2): qty 1

## 2014-08-05 MED ORDER — SODIUM CHLORIDE 0.9 % IJ SOLN: 3.0000 mL | INTRAMUSCULAR | Status: DC | PRN

## 2014-08-05 MED ORDER — ALBUTEROL SULFATE (2.5 MG/3ML) 0.083% IN NEBU
2.5000 mg | INHALATION_SOLUTION | Freq: Once | RESPIRATORY_TRACT | Status: AC
  Administered 2014-08-05: 2.5 mg via RESPIRATORY_TRACT
  Filled 2014-08-05: qty 3

## 2014-08-05 MED ORDER — DEXTROSE-NACL 5-0.9 % IV SOLN
INTRAVENOUS | Status: DC
  Administered 2014-08-05 – 2014-08-06 (×3): via INTRAVENOUS

## 2014-08-05 MED ORDER — HEPARIN SODIUM (PORCINE) 5000 UNIT/ML IJ SOLN
5000.0000 [IU] | Freq: Three times a day (TID) | INTRAMUSCULAR | Status: DC
  Administered 2014-08-05 – 2014-08-07 (×6): 5000 [IU] via SUBCUTANEOUS
  Filled 2014-08-05 (×8): qty 1

## 2014-08-05 MED ORDER — OXYCODONE HCL ER 40 MG PO T12A
120.0000 mg | EXTENDED_RELEASE_TABLET | Freq: Three times a day (TID) | ORAL | Status: DC
  Administered 2014-08-05 – 2014-08-07 (×6): 120 mg via ORAL
  Filled 2014-08-05 (×3): qty 3
  Filled 2014-08-05 (×2): qty 6
  Filled 2014-08-05: qty 3

## 2014-08-05 MED ORDER — MONTELUKAST SODIUM 10 MG PO TABS
10.0000 mg | ORAL_TABLET | Freq: Every morning | ORAL | Status: DC
  Administered 2014-08-07: 10 mg via ORAL
  Filled 2014-08-05 (×3): qty 1

## 2014-08-05 MED ORDER — FENTANYL 75 MCG/HR TD PT72
75.0000 ug | MEDICATED_PATCH | TRANSDERMAL | Status: DC
  Administered 2014-08-07: 75 ug via TRANSDERMAL
  Filled 2014-08-05: qty 1

## 2014-08-05 MED ORDER — LORAZEPAM 2 MG/ML IJ SOLN
0.5000 mg | Freq: Once | INTRAMUSCULAR | Status: AC
  Administered 2014-08-05: 0.5 mg via INTRAVENOUS
  Filled 2014-08-05: qty 1

## 2014-08-05 MED ORDER — ENSURE COMPLETE PO LIQD: 237.0000 mL | Freq: Three times a day (TID) | ORAL | Status: DC

## 2014-08-05 MED ORDER — POLYETHYLENE GLYCOL 3350 17 G PO PACK
17.0000 g | PACK | Freq: Every day | ORAL | Status: DC | PRN
  Filled 2014-08-05: qty 1

## 2014-08-05 MED ORDER — OXYCODONE HCL 5 MG PO TABS
40.0000 mg | ORAL_TABLET | ORAL | Status: DC | PRN
  Administered 2014-08-05: 50 mg via ORAL
  Administered 2014-08-06: 60 mg via ORAL
  Filled 2014-08-05: qty 12
  Filled 2014-08-05: qty 10

## 2014-08-05 MED ORDER — SODIUM CHLORIDE 0.9 % IV BOLUS (SEPSIS)
1000.0000 mL | Freq: Once | INTRAVENOUS | Status: AC
  Administered 2014-08-05: 1000 mL via INTRAVENOUS

## 2014-08-05 MED ORDER — GLYCOPYRROLATE 0.2 MG/ML IJ SOLN
0.2000 mg | Freq: Once | INTRAMUSCULAR | Status: AC
  Administered 2014-08-05: 0.2 mg via INTRAVENOUS
  Filled 2014-08-05: qty 1

## 2014-08-05 MED ORDER — BUDESONIDE-FORMOTEROL FUMARATE 160-4.5 MCG/ACT IN AERO
2.0000 | INHALATION_SPRAY | Freq: Every day | RESPIRATORY_TRACT | Status: DC
  Administered 2014-08-05 – 2014-08-08 (×3): 2 via RESPIRATORY_TRACT
  Filled 2014-08-05: qty 6

## 2014-08-05 MED ORDER — SODIUM CHLORIDE 0.9 % IV SOLN: 250.0000 mL | INTRAVENOUS | Status: DC | PRN

## 2014-08-05 MED ORDER — LEVOFLOXACIN IN D5W 500 MG/100ML IV SOLN
500.0000 mg | INTRAVENOUS | Status: DC
  Administered 2014-08-05: 500 mg via INTRAVENOUS
  Filled 2014-08-05: qty 100

## 2014-08-05 NOTE — H&P (Signed)
[facility] History and Physical  Nicholas Soto CVE:[id number] DOB: June 12, 1951 DOA: 08/03/2014  Referring physician: Dr. Wilson Singer PCP: [name], MD   Chief Complaint: progressive weakness and decreased oral intake  HPI: Nicholas Soto is a 63 y.o. 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 Much of the history is obtained from family at bedside as patient is somnolent. Patient followed by Dr. [name]. Presented to the hospital with the above complaints.  Reportedly patient went to a rehab facility but checked himself out and once he went home he got progressively weaker and his oral intake became poor.  Family reports he was unable to take his home pain regimen. He was evaluated by home health nurse who recommended he presents to the hospital for further evaluation recommendations given soft blood pressures.  We were consulted for further medical evaluation recommendations regarding failure to thrive   Review of Systems:  Unable to assess due to somnolence  Past Medical History  Diagnosis Date  . Chest pain     previous CP thought r/t GI; mild CAD by cath 2002  . Hypertension     pt denies h/o htn  . COPD (chronic obstructive pulmonary disease)   . Hyperlipidemia     taken off statin 1 mo ago by primary care bc he was "put on a new med that might interfere"  . Tobacco abuse   . 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 Gout   . Shortness of breath   . Syncope   . Cancer Left    of the eye  . 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 Shoulder surgery    . Neck surgery    . Eye surgery      cancer  . Spine surgery    . Knee cartilage surgery    . Cholecystectomy     Social History:  reports that he has been  smoking Cigarettes.  He has a 10 pack-year smoking history. He has never used smokeless tobacco. He reports that he does not drink alcohol or use illicit drugs.  Allergies  Allergen Reactions  . Meperidine Hcl Nausea And Vomiting    Family History  Problem Relation Age of Onset  . Heart attack Mother 66  . Heart attack Brother 84  . Diabetes Mother   . Multiple sclerosis Brother   . Diabetes Sister   . Alzheimer's disease Maternal Grandmother   . Diabetes Maternal Grandmother   . Seizures Father      Prior to Admission medications   Medication Sig Start Date End Date Taking? Authorizing Provider  Aclidinium Bromide 400 MCG/ACT AEPB Inhale 1 puff into the lungs 2 (two) times daily.   Yes Historical Provider, MD  alum & mag hydroxide-simeth (MAALOX/MYLANTA) 200-200-20 MG/5ML suspension Take 30 mLs by mouth every 8 (eight) hours as needed for indigestion or heartburn. 08/02/14  Yes Robbie Lis, MD  antiseptic oral rinse (BIOTENE) LIQD 15 mLs by Mouth Rinse route 2 (two) times daily. 07/24/14  Yes Venetia Maxon Rama, MD  budesonide-formoterol (SYMBICORT) 160-4.5 MCG/ACT inhaler Inhale 2 puffs into the lungs at bedtime.   Yes Historical Provider, MD  dexamethasone (DECADRON) 4 MG tablet Take 1 tablet (4 mg total) by  mouth every 6 (six) hours. 07/24/14  Yes Venetia Maxon Rama, MD  diphenhydrAMINE (SOMINEX) 25 MG tablet Take 25 mg by mouth at bedtime as needed for sleep.    Yes Historical Provider, MD  feeding supplement, ENSURE COMPLETE, (ENSURE COMPLETE) LIQD Take 237 mLs by mouth 3 (three) times daily between meals. 07/24/14  Yes Christina P Rama, MD  fentaNYL (DURAGESIC - DOSED MCG/HR) 75 MCG/HR 1 patch every 3 (three) days. As needed for pain 06/21/14  Yes Historical Provider, MD  ibuprofen (ADVIL,MOTRIN) 200 MG tablet Take 400 mg by mouth every 6 (six) hours as needed for moderate pain.   Yes Historical Provider, MD  levalbuterol Penne Lash) 0.63 MG/3ML nebulizer solution Take 3 mLs (0.63 mg  total) by nebulization every 6 (six) hours as needed for wheezing or shortness of breath. 07/24/14  Yes Venetia Maxon Rama, MD  levofloxacin (LEVAQUIN) 750 MG tablet Take 1 tablet (750 mg total) by mouth daily. 08/02/14  Yes Robbie Lis, MD  LORazepam (ATIVAN) 0.5 MG tablet Take 2 tablets (1 mg total) by mouth 2 (two) times daily as needed for anxiety. 08/02/14  Yes Robbie Lis, MD  mirtazapine (REMERON) 15 MG tablet Take 1 tablet (15 mg total) by mouth at bedtime. 07/24/14  Yes Christina P Rama, MD  montelukast (SINGULAIR) 10 MG tablet Take 10 mg by mouth every morning.    Yes Historical Provider, MD  nicotine (NICODERM CQ - DOSED IN MG/24 HOURS) 21 mg/24hr patch Place 1 patch (21 mg total) onto the skin daily. 07/24/14  Yes Christina P Rama, MD  omeprazole (PRILOSEC) 20 MG capsule Take 20 mg by mouth every morning.   Yes Historical Provider, MD  ondansetron (ZOFRAN) 4 MG tablet Take 1 tablet (4 mg total) by mouth every 6 (six) hours as needed for nausea. 07/24/14  Yes Venetia Maxon Rama, MD  Oxycodone HCl 20 MG TABS Take 2-3 tablets by mouth every 3 (three) hours as needed (pain).   Yes Historical Provider, MD  OxyCODONE HCl ER 60 MG T12A Take 120 mg by mouth every 8 (eight) hours. 08/02/14  Yes Robbie Lis, MD  PARoxetine (PAXIL) 10 MG tablet Take 10 mg by mouth every morning.   Yes Historical Provider, MD  polyethylene glycol (MIRALAX / GLYCOLAX) packet Take 17 g by mouth daily as needed for mild constipation. 07/24/14  Yes Christina P Rama, MD  pregabalin (LYRICA) 75 MG capsule Take 1 capsule (75 mg total) by mouth 2 (two) times daily. 07/24/14  Yes Venetia Maxon Rama, MD  promethazine (PHENERGAN) 25 MG tablet Take 25 mg by mouth every 8 (eight) hours as needed for nausea or vomiting.   Yes Historical Provider, MD  scopolamine (TRANSDERM-SCOP) 1 MG/3DAYS Place 1 patch (1.5 mg total) onto the skin every 3 (three) days. 07/24/14  Yes Venetia Maxon Rama, MD  senna (SENOKOT) 8.6 MG TABS tablet Take 1 tablet (8.6 mg  total) by mouth daily. 07/24/14  Yes Christina P Rama, MD  albuterol (PROVENTIL HFA;VENTOLIN HFA) 108 (90 BASE) MCG/ACT inhaler Inhale 2 puffs into the lungs every 6 (six) hours as needed for wheezing or shortness of breath.    Historical Provider, MD   Physical Exam: Filed Vitals:   08/23/2014 1659 08/03/2014 1730 08/15/2014 1732 07/29/2014 1800  BP:  104/53 104/53 91/52  Pulse:  94 93 92  Temp:   98 F (36.7 C)   TempSrc:   Rectal   Resp:  11 12 15   SpO2: 94% 94% 94% 93%  Wt Readings from Last 3 Encounters:  07/29/14 74.39 kg (164 lb)  07/13/14 74.662 kg (164 lb 9.6 oz)  03/08/12 81.647 kg (180 lb)    General:  Resting comfortably, somnolent but arousable Eyes: PERRL, normal lids, irises & conjunctiva ENT: grossly normal hearing, lips & tongue, dry mucous membranes Neck: no LAD, masses or thyromegaly Cardiovascular: RRR, no m/r/g. No LE edema. Respiratory: CTA bilaterally, no w/r/r. Normal respiratory effort. Abdomen: soft, nt, nd, obese Skin: no rash or induration seen on limited exam Musculoskeletal: grossly normal tone BUE/BLE Psychiatric: Unable to accurately assess due to somnolence Neurologic: No facial asymmetry, limited interaction with examiner           Labs on Admission:  Basic Metabolic Panel:  Recent Labs Lab 07/30/14 0430 08/04/2014 1615  NA 130* 131*  K 3.7 4.6  CL 96 91*  CO2 22 28  GLUCOSE 110* 91  BUN 21 24*  CREATININE 0.43* 0.46*  CALCIUM 8.3* 8.2*   Liver Function Tests: No results found for this basename: AST, ALT, ALKPHOS, BILITOT, PROT, ALBUMIN,  in the last 168 hours No results found for this basename: LIPASE, AMYLASE,  in the last 168 hours No results found for this basename: AMMONIA,  in the last 168 hours CBC:  Recent Labs Lab 07/30/14 0430 08/14/2014 1615  WBC 9.0 6.3  NEUTROABS  --  5.8  HGB 11.0* 11.4*  HCT 31.3* 33.4*  MCV 86.2 87.0  PLT 182 127*   Cardiac Enzymes:  Recent Labs Lab 07/29/14 2302 07/30/14 0430  TROPONINI  <0.30 <0.30    BNP (last 3 results) No results found for this basename: PROBNP,  in the last 8760 hours CBG: No results found for this basename: GLUCAP,  in the last 168 hours  Radiological Exams on Admission: Dg Chest Portable 1 View  08/08/2014   CLINICAL DATA:  Chest pain with history of tobacco use, hypertension and COPD. Patient has history of ocular malignancy. And lower crash and lumbar spine tumor  EXAM: PORTABLE CHEST - 1 VIEW  COMPARISON:  CT scan of the chest of August 01, 2014 and portable chest x-ray of July 29, 2014.  FINDINGS: Right paratracheal lymphadenopathy persists. The lungs are adequately inflated. The interstitial markings have increased bilaterally since the previous study. The cardiac silhouette is normal in size. The pulmonary vascularity is not clearly engorged.  IMPRESSION: Mildly increased interstitial markings bilaterally are slightly more conspicuous than in the past. This may reflect interstitial edema of cardiac or noncardiac cause. There is no focal pneumonia.   Electronically Signed   By: David  Martinique   On: 08/07/2014 16:42    Assessment/Plan   Failure to thrive in adult - Will consult palliative in the morning for goals of care as well as setting up home palliative/hospice services    Dehydration - Place on maintenance IV fluid  Active Problems:   TOBACCO ABUSE - Stable continue nicotine    COPD  - Currently compensated    Bone metastasis - Stable continue home regimen,     Severe protein-calorie malnutrition - Liberalize diet    Intractable pain - We'll continue home pain regimen  ED physician documented HCAP - Report for chest x-ray does not report focal pneumonia, as single dose of Levaquin given in the ED. Given no fevers and no elevated white blood cell count was negative chest x-ray will discontinue antibiotic  Code Status: Full DVT Prophylaxis:heparin Family Communication: none Disposition Plan: Pending improvement in condition,  med surg in patient,  consult  Time spent: > 45 minutes  Velvet Bathe [facility] Pager [phone]  **Disclaimer: This note may have been dictated with voice recognition software. Similar sounding words can inadvertently be transcribed and this note may contain transcription errors which may not have been corrected upon publication of note.**

## 2014-08-05 NOTE — ED Notes (Signed)
Pt presents via EMS with c/o generalized pain. Pt has end stage bone cancer but hospice has not been called in yet. Pt has been unable to take his pain medication and is reporting pain all over at this time. Per EMS, pt reported that home health nurse said that he also has two stage 3 ulcers on his bottom. Per EMS, pt is able to answer questions but does not appear interested in communicating with EMS. Pt wears home O2 at all time, 2L.

## 2014-08-05 NOTE — Telephone Encounter (Signed)
B/P 90/60, P-73, O2 SAT ON ROOM AIR IS 88%, AND TEMPERATURE IS 96.8. PT. IS PALE AND LETHARGIC. HE IS NOT EATING OR DRINKING. PT.'S URINE IS DARK WITH AN OUTPUT OF 500CC SINCE YESTERDAY MORNING. HE HAS DIMINISHED LUNG SOUNDS BILATERALLY. PT. ALSO HAS UPPER AIRWAY CONGESTION. HE IS HAVING A GREAT DEAL OF PAIN AND IS UNABLE TO SWALLOW HIS PO PAIN MEDICATIONS. FAMILY WOULD LIKE A HOSPICE CONSULT TODAY AND REQUESTED PT.TO BE A DNR. SPOKE TO DR.CHISM'S NURSE, ROBIN BASS,RN. HOSPICE WOULD NOT BE ABLE TO SEE PT. TODAY DUE TO THE LATE AFTERNOON HOUR. PT. TO GO TO THE EMERGENCY ROOM FOR ADMISSION TO [place] FOR PAIN MANAGEMENT. NOTIFIED [place]. SHE VOICES UNDERSTANDING.

## 2014-08-05 NOTE — Telephone Encounter (Signed)
[facility] to evaluate the pt. Pt's wife met her at the door to let her know pt is in a lot of pain and not up to PT. She states he has skin break down, weight loss and is in a lot of pain. She feels the pt could benefit from a nurse going to evaluate the patient. Pt is to see Dr. [name] 08/07/14 for hospital follow up. Dr. [name] is out of office but orders given per Awilda Metro, PA for [facility]. I called Lurlean Leyden, RN and and left her a message that orders have been placed.

## 2014-08-05 NOTE — Progress Notes (Signed)
  CARE MANAGEMENT ED NOTE 08/04/2014  Patient:  Nicholas Soto, Nicholas Soto   Account Number:  [id number]  Date Initiated:  08/07/2014  Documentation initiated by:  Jackelyn Poling  Subjective/Objective Assessment:   63 yr old [facility] [place] resident PMH htn, COPD, left eye cancer s/p radiation treatment, neuropathy with chronic foot pain, intractable back pain secondary to bone mets c/o progressive weakness & decreased oral intake     Subjective/Objective Assessment Detail:   Followed by Dr. [name]  has appt for 08/07/14 when MD is available  went to a rehab facility but checked himself out and once he went home he got progressively weaker and his oral intake became poor. [facility] -recommended  pt to go to  ED  Pt noted to be sleeping during CM assessement with periodic awaking that returned to dozing  [place] choice for hospice is [facility]     Action/Plan:   ED CM noted CM consult for Pt's family wants pt to go home with hospice upon discharge. Please provide assistance. Cm spoke with wife & daughter at bedside.  CM reviewed palliative and home hospice services Offered hospice choices Provided   Action/Plan Detail:   written contact information for [facility].  Requested orders via EPIC from attending MD   Anticipated DC Date:  08/08/2014     Status Recommendation to Physician:   Result of Recommendation:    Other ED Services  Consult Working Plan   In-house referral  Hospice / [place]  CM consult  Outpatient Services - Pt will follow up   [facility] Choice  HOSPICE   Choice offered to / List presented to:  C-3 Spouse     HH arranged  HH-1 RN      [facility].    Status of service:  Completed, signed off  ED Comments:   ED Comments Detail:

## 2014-08-05 NOTE — ED Notes (Signed)
Bed: WA17 Expected date:  Expected time:  Means of arrival:  Comments: ems- bone cancer pt

## 2014-08-05 NOTE — Progress Notes (Signed)
UR completed 

## 2014-08-05 NOTE — ED Notes (Signed)
Respiratory called for breathing treatment.

## 2014-08-05 NOTE — ED Provider Notes (Signed)
CSN: [id number]     Arrival date & time 08/24/2014  1534 History   First MD Initiated Contact with Patient 08/02/2014 1538     Chief Complaint  Patient presents with  . Pain     (Consider location/radiation/quality/duration/timing/severity/associated sxs/prior Treatment) HPI  64 y.o. male with a PMH HTN, COPD, neuropathy with chronic pain and recent admission for intractable back pain secondary to bone metastasis. Since discharge, pt has continued to decline. Not eating or able to take medications. Making little urine and what he does make "looks like tea." No vomiting. No fever.  Most history from family at bedside and review of records. Pt can answer questions but seems very uncomfortable and does so only selectively. Patient was receiving radiation treatment which is now completed and plan is for chemotherapy under Dr. [name] care.   Past Medical History  Diagnosis Date  . Chest pain     previous CP thought r/t GI; mild CAD by cath 2002  . Hypertension     pt denies h/o htn  . COPD (chronic obstructive pulmonary disease)   . Hyperlipidemia     taken off statin 1 mo ago by primary care bc he was "put on a new med that might interfere"  . Tobacco abuse   . 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 Gout   . Shortness of breath   . Syncope   . Cancer Left    of the eye  . 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 Shoulder surgery    . Neck surgery    . Eye surgery      cancer  . Spine surgery    . Knee cartilage surgery    . Cholecystectomy     Family History  Problem Relation Age of Onset  . Heart attack Mother 48  . Heart attack Brother 76  . Diabetes Mother   . Multiple sclerosis Brother   . Diabetes Sister   . Alzheimer's disease Maternal Grandmother   . Diabetes Maternal Grandmother   . Seizures Father    History  Substance Use Topics  . Smoking status: Smoker, Current Status Unknown -- 0.50  packs/day for 20 years    Types: Cigarettes  . Smokeless tobacco: Never Used  . Alcohol Use: No    Review of Systems  Level 5 caveat because of pt acuity.   Allergies  Demerol and Meperidine hcl  Home Medications   Prior to Admission medications   Medication Sig Start Date End Date Taking? Authorizing Provider  Aclidinium Bromide 400 MCG/ACT AEPB Inhale 1 puff into the lungs 2 (two) times daily.    Historical Provider, MD  albuterol (PROVENTIL HFA;VENTOLIN HFA) 108 (90 BASE) MCG/ACT inhaler Inhale 2 puffs into the lungs every 6 (six) hours as needed for wheezing or shortness of breath.    Historical Provider, MD  alum & mag hydroxide-simeth (MAALOX/MYLANTA) 200-200-20 MG/5ML suspension Take 30 mLs by mouth every 8 (eight) hours as needed for indigestion or heartburn. 08/02/14   Robbie Lis, MD  antiseptic oral rinse (BIOTENE) LIQD 15 mLs by Mouth Rinse route 2 (two) times daily. 07/24/14   Venetia Maxon Rama, MD  budesonide-formoterol (SYMBICORT) 160-4.5 MCG/ACT inhaler Inhale 2 puffs into the lungs at bedtime.    Historical Provider, MD  dexamethasone (DECADRON) 4 MG tablet Take 1 tablet (4 mg total) by mouth every 6 (six)  hours. 07/24/14   Venetia Maxon Rama, MD  diphenhydrAMINE (SOMINEX) 25 MG tablet Take 25 mg by mouth at bedtime as needed for sleep.     Historical Provider, MD  feeding supplement, ENSURE COMPLETE, (ENSURE COMPLETE) LIQD Take 237 mLs by mouth 3 (three) times daily between meals. 07/24/14   Venetia Maxon Rama, MD  ibuprofen (ADVIL,MOTRIN) 200 MG tablet Take 400 mg by mouth every 6 (six) hours as needed for moderate pain.    Historical Provider, MD  levalbuterol Penne Lash) 0.63 MG/3ML nebulizer solution Take 3 mLs (0.63 mg total) by nebulization every 6 (six) hours as needed for wheezing or shortness of breath. 07/24/14   Venetia Maxon Rama, MD  levofloxacin (LEVAQUIN) 750 MG tablet Take 1 tablet (750 mg total) by mouth daily. 08/02/14   Robbie Lis, MD  LORazepam (ATIVAN) 0.5 MG  tablet Take 2 tablets (1 mg total) by mouth 2 (two) times daily as needed for anxiety. 08/02/14   Robbie Lis, MD  mirtazapine (REMERON) 15 MG tablet Take 1 tablet (15 mg total) by mouth at bedtime. 07/24/14   Christina P Rama, MD  montelukast (SINGULAIR) 10 MG tablet Take 10 mg by mouth every morning.     Historical Provider, MD  nicotine (NICODERM CQ - DOSED IN MG/24 HOURS) 21 mg/24hr patch Place 1 patch (21 mg total) onto the skin daily. 07/24/14   Venetia Maxon Rama, MD  omeprazole (PRILOSEC) 20 MG capsule Take 20 mg by mouth every morning.    Historical Provider, MD  ondansetron (ZOFRAN) 4 MG tablet Take 1 tablet (4 mg total) by mouth every 6 (six) hours as needed for nausea. 07/24/14   Venetia Maxon Rama, MD  Oxycodone HCl 20 MG TABS Take 2-3 tablets (40-60 mg total) by mouth every 3 (three) hours as needed. 08/02/14   Robbie Lis, MD  OxyCODONE HCl ER 60 MG T12A Take 120 mg by mouth every 8 (eight) hours. 08/02/14   Robbie Lis, MD  pantoprazole (PROTONIX) 40 MG tablet Take 40 mg by mouth every morning.     Historical Provider, MD  PARoxetine (PAXIL) 10 MG tablet Take 10 mg by mouth every morning.    Historical Provider, MD  polyethylene glycol (MIRALAX / GLYCOLAX) packet Take 17 g by mouth daily as needed for mild constipation. 07/24/14   Venetia Maxon Rama, MD  pregabalin (LYRICA) 75 MG capsule Take 1 capsule (75 mg total) by mouth 2 (two) times daily. 07/24/14   Venetia Maxon Rama, MD  promethazine (PHENERGAN) 25 MG tablet Take 25 mg by mouth every 8 (eight) hours as needed for nausea or vomiting.    Historical Provider, MD  scopolamine (TRANSDERM-SCOP) 1 MG/3DAYS Place 1 patch (1.5 mg total) onto the skin every 3 (three) days. 07/24/14   Venetia Maxon Rama, MD  senna (SENOKOT) 8.6 MG TABS tablet Take 1 tablet (8.6 mg total) by mouth daily. 07/24/14   Christina P Rama, MD   BP 104/53  Pulse 85  Temp(Src) 97.8 F (36.6 C) (Oral)  Resp 14  SpO2 91% Physical Exam  Nursing note and vitals  reviewed. Constitutional: He appears distressed.  Appears chronically ill and uncomfortable. Laying with eyes closed. Occasionally moaning.   HENT:  Head: Normocephalic and atraumatic.  Dry mucus membranes  Eyes: Conjunctivae are normal. Pupils are equal, round, and reactive to light. Right eye exhibits no discharge. Left eye exhibits no discharge.  Neck: Neck supple.  Cardiovascular: Normal rate, regular rhythm and normal heart sounds.  Exam  reveals no gallop and no friction rub.   No murmur heard. Pulmonary/Chest:  Gurgling respirations. No adventitious lungs sounds noted aside from what is likely transmitted upper airway noise.   Abdominal: Soft. He exhibits no distension. There is no tenderness.  Musculoskeletal: He exhibits no edema and no tenderness.  Neurological:  Drowsy. Opens eyes to voice. Follows simple commands.   Skin: Skin is warm and dry. There is pallor.    ED Course  Procedures (including critical care time) Labs Review Labs Reviewed  CBC WITH DIFFERENTIAL - Abnormal; Notable for the following:    RBC 3.84 (*)    Hemoglobin 11.4 (*)    HCT 33.4 (*)    Platelets 127 (*)    Neutrophils Relative % 92 (*)    Lymphocytes Relative 3 (*)    Lymphs Abs 0.2 (*)    All other components within normal limits  BASIC METABOLIC PANEL - Abnormal; Notable for the following:    Sodium 131 (*)    Chloride 91 (*)    BUN 24 (*)    Creatinine, Ser 0.46 (*)    Calcium 8.2 (*)    All other components within normal limits  COMPREHENSIVE METABOLIC PANEL - Abnormal; Notable for the following:    Sodium 131 (*)    Chloride 94 (*)    Glucose, Bld 124 (*)    Calcium 7.8 (*)    Total Protein 4.9 (*)    Albumin 2.0 (*)    All other components within normal limits  CBC - Abnormal; Notable for the following:    RBC 3.36 (*)    Hemoglobin 10.1 (*)    HCT 29.4 (*)    Platelets 100 (*)    All other components within normal limits  PHOSPHORUS - Abnormal; Notable for the following:     Phosphorus 5.1 (*)    All other components within normal limits  CBC - Abnormal; Notable for the following:    RBC 3.35 (*)    Hemoglobin 10.1 (*)    HCT 29.1 (*)    Platelets 105 (*)    All other components within normal limits  BASIC METABOLIC PANEL - Abnormal; Notable for the following:    Sodium 133 (*)    Glucose, Bld 120 (*)    Creatinine, Ser 0.48 (*)    Calcium 7.9 (*)    All other components within normal limits  BASIC METABOLIC PANEL - Abnormal; Notable for the following:    Sodium 130 (*)    Chloride 93 (*)    Glucose, Bld 111 (*)    Creatinine, Ser 0.48 (*)    Calcium 8.1 (*)    All other components within normal limits  MAGNESIUM    Imaging Review Dg Chest Port 1 View  08/07/2014   CLINICAL DATA:  PICC line placement. Metastatic carcinoma of left orbit.  EXAM: PORTABLE CHEST - 1 VIEW  COMPARISON:  08/09/2014  FINDINGS: Right-sided PICC line extends superiorly in the right side of the neck, above the superior aspect of the film.  Normal heart size. Right paratracheal mass/adenopathy, as detailed previously. Hyperinflation. No pleural effusion or pneumothorax. chronic interstitial thickening. Favor volume loss in the right suprahilar region.  IMPRESSION: Right-sided PICC line extending superiorly into the right-sided of the neck. Critical test results telephoned to . Dr. Grandville Silos. at the time of interpretation at 1:51 p.m.on 08/07/2014.  COPD/chronic bronchitis with right paratracheal mass/adenopathy.  Favor volume loss and atelectasis in the right suprahilar region. Early infection or postobstructive  pneumonitis could look similar.   Electronically Signed   By: Abigail Miyamoto M.D.   On: 08/07/2014 13:52     EKG Interpretation None      MDM   Final diagnoses:  Intractable pain  Metastatic cancer    63yM with metastatic CA. Pt obviously in a great deal of pain to point of having difficulty even having a simple conversation. Does selectively answer some questions  and does so appropriately.  Unable to eat or take medications. Currently living at home. Pt/family interested in Palliative care.  DNR. Not sure about comfort measures only at this time. Will place IV. Pain meds. IVF. Gurgling respirations. Will give some glycopyrrolate. Levaquin for HCAP noted on CT chest 8/6. Will discuss with medicine for admission.     Virgel Manifold, MD 08/08/14 [id number]

## 2014-08-06 ENCOUNTER — Encounter (HOSPITAL_COMMUNITY): Payer: Self-pay

## 2014-08-06 DIAGNOSIS — Z515 Encounter for palliative care: Secondary | ICD-10-CM

## 2014-08-06 DIAGNOSIS — F411 Generalized anxiety disorder: Secondary | ICD-10-CM

## 2014-08-06 DIAGNOSIS — G893 Neoplasm related pain (acute) (chronic): Secondary | ICD-10-CM | POA: Diagnosis present

## 2014-08-06 LAB — CBC
HEMATOCRIT: 29.4 % — AB (ref 39.0–52.0)
Hemoglobin: 10.1 g/dL — ABNORMAL LOW (ref 13.0–17.0)
MCH: 30.1 pg (ref 26.0–34.0)
MCHC: 34.4 g/dL (ref 30.0–36.0)
MCV: 87.5 fL (ref 78.0–100.0)
Platelets: 100 10*3/uL — ABNORMAL LOW (ref 150–400)
RBC: 3.36 MIL/uL — AB (ref 4.22–5.81)
RDW: 14.4 % (ref 11.5–15.5)
WBC: 5.5 10*3/uL (ref 4.0–10.5)

## 2014-08-06 LAB — COMPREHENSIVE METABOLIC PANEL
ALT: 14 U/L (ref 0–53)
AST: 24 U/L (ref 0–37)
Albumin: 2 g/dL — ABNORMAL LOW (ref 3.5–5.2)
Alkaline Phosphatase: 80 U/L (ref 39–117)
Anion gap: 9 (ref 5–15)
BUN: 19 mg/dL (ref 6–23)
CALCIUM: 7.8 mg/dL — AB (ref 8.4–10.5)
CHLORIDE: 94 meq/L — AB (ref 96–112)
CO2: 28 mEq/L (ref 19–32)
Creatinine, Ser: 0.52 mg/dL (ref 0.50–1.35)
GFR calc Af Amer: >90 mL/min (ref >90)
GFR calc non Af Amer: >90 mL/min (ref >90)
Glucose, Bld: 124 mg/dL — ABNORMAL HIGH (ref 70–99)
Potassium: 4.1 mEq/L (ref 3.7–5.3)
SODIUM: 131 meq/L — AB (ref 137–147)
Total Bilirubin: 0.4 mg/dL (ref 0.3–1.2)
Total Protein: 4.9 g/dL — ABNORMAL LOW (ref 6.0–8.3)

## 2014-08-06 MED ORDER — BISACODYL 10 MG RE SUPP
10.0000 mg | Freq: Every day | RECTAL | Status: DC | PRN
Start: 2014-08-06 — End: 2014-08-10

## 2014-08-06 MED ORDER — SENNOSIDES-DOCUSATE SODIUM 8.6-50 MG PO TABS
1.0000 | ORAL_TABLET | Freq: Every day | ORAL | Status: DC
  Administered 2014-08-06: 1 via ORAL
  Filled 2014-08-06 (×6): qty 1

## 2014-08-06 MED ORDER — GI COCKTAIL ~~LOC~~
30.0000 mL | Freq: Three times a day (TID) | ORAL | Status: DC | PRN
  Filled 2014-08-06: qty 30

## 2014-08-06 MED ORDER — ALBUTEROL SULFATE (2.5 MG/3ML) 0.083% IN NEBU
3.0000 mL | INHALATION_SOLUTION | RESPIRATORY_TRACT | Status: DC | PRN
  Administered 2014-08-09: 3 mL via RESPIRATORY_TRACT
  Filled 2014-08-06: qty 3

## 2014-08-06 MED ORDER — LORAZEPAM 1 MG PO TABS
1.0000 mg | ORAL_TABLET | Freq: Four times a day (QID) | ORAL | Status: DC | PRN
  Administered 2014-08-06: 1 mg via ORAL
  Filled 2014-08-06: qty 1

## 2014-08-06 MED ORDER — HYDROMORPHONE HCL PF 1 MG/ML IJ SOLN
1.0000 mg | INTRAMUSCULAR | Status: DC | PRN
  Administered 2014-08-06 – 2014-08-07 (×4): 1 mg via INTRAVENOUS
  Filled 2014-08-06 (×4): qty 1

## 2014-08-06 NOTE — Progress Notes (Signed)
Patient ID: Nicholas Soto, male   DOB: March 31, 1951, 63 y.o.   MRN: [id number]  [facility] PROGRESS NOTE  RUI WORDELL EVO:[id number] DOB: 1951/08/06 DOA: 08/26/2014 PCP: [name], MD  Brief narrative: 63 y.o. 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 Patient's family reported that he has had very poor by mouth intake and has been taking large amounts of pain medications to control the pain. Patient was receiving radiation treatment and chemotherapy under Dr. [name] care.   Assessment and Plan:   Principal Problem:  Intractable cancer related pain, fatigue, weakness in the setting of widespread bony metastases of unknown primary  Patient has completed radiation therapy but continues to clinically deteriorate Continue Decadron 4 mg by mouth every 6 hours.  Continue pain management efforts: Oxycodone 120 mg every 8 hours along with analgesia as needed for breakthrough pain  Adjuvant therapy with pregabalin 75 mg twice daily Active Problems:  Anemia of chronic disease  Likely due to malignancy. Hemoglobin is 11.4 --> 10.1. No current indications for transfusion. CBC in AM Chronic obstructive pulmonary disease / Acute respiratory failure with hypoxia / HCAP  On home oxygen. Continue BD's scheduled and as needed  No sings of PNA, agree with holding ABX for now Thrombocytopenia  Stable, repeat CBC in AM Anxiety and depression  Stable Unspecified hereditary and idiopathic peripheral neuropathy  Continue lyrica 75 mg PO BID. Functional quadriplegia  Limited mobility secondary to severe pain from bone metastases. PCT consult for [facility] discussions   Severe protein calorie malnutrition  Continue ensure supplementation.  DVT  prophylaxis / GI prophylaxis  Heparin ordered while inpatient. No signs of bleeding.  Protonix for GI prophylaxis since patient is on steroids.  IV Access:   Peripheral IV Procedures and diagnostic studies:    CXR 08/24/2014 Mildly increased interstitial markings bilaterally are slightly more conspicuous than in the past. This may reflect interstitial edema of cardiac or noncardiac cause. There is no focal pneumonia.  Medical Consultants:   Dr. Concha Norway, Oncology  PCT  Other Consultants:   Physical therapy  Anti-Infectives:   None  Code Status: Full Family Communication: Pt and wife at bedside Disposition Plan: Home when medically stable  HPI/Subjective: No events overnight.   Objective: Filed Vitals:   08/08/2014 1902 08/08/2014 2138 08/06/14 0636 08/06/14 0910  BP: 98/59 103/57 111/53   Pulse: 87 84 75   Temp: 97.8 F (36.6 C) 98 F (36.7 C) 97.6 F (36.4 C)   TempSrc: Axillary Axillary Axillary   Resp: 15 18 16    SpO2: 92% 98% 96% 96%    Intake/Output Summary (Last 24 hours) at 08/06/14 1007 Last data filed at 08/06/14 0340  Gross per 24 hour  Intake      0 ml  Output    500 ml  Net   -500 ml    Exam:   General:  Pt is alert, follows commands appropriately, not in acute distress  Cardiovascular: Regular rate and rhythm, S1/S2, no murmurs, no rubs, no gallops  Respiratory: Clear to auscultation bilaterally, no wheezing, diminished breath sounds at bases   Abdomen: Soft, non tender, non distended, bowel sounds present, no guarding  Data Reviewed: Basic Metabolic Panel:  Recent Labs Lab 08/14/2014 1615 08/06/14 0525  NA 131* 131*  K 4.6 4.1  CL 91* 94*  CO2 28 28  GLUCOSE 91 124*  BUN 24* 19  CREATININE 0.46* 0.52  CALCIUM 8.2* 7.8*  MG 2.1  --   PHOS 5.1*  --    Liver Function Tests:  Recent Labs Lab 08/06/14 0525  AST 24  ALT 14  ALKPHOS 80  BILITOT 0.4  PROT 4.9*  ALBUMIN 2.0*   CBC:  Recent Labs Lab 08/04/2014 1615  08/06/14 0525  WBC 6.3 5.5  NEUTROABS 5.8  --   HGB 11.4* 10.1*  HCT 33.4* 29.4*  MCV 87.0 87.5  PLT 127* 100*   Scheduled Meds: . budesonide-formoterol  2 puff Inhalation QHS  . dexamethasone  4 mg Oral 4 times per day  . heparin  5,000 Units Subcutaneous 3 times per day  . mirtazapine  15 mg Oral QHS  . montelukast  10 mg Oral q morning - 10a  . nicotine  21 mg Transdermal Daily  . OxyCODONE  120 mg Oral 3 times per day  . pantoprazole  40 mg Oral Daily  . PARoxetine  10 mg Oral q morning - 10a  . pregabalin  75 mg Oral BID  . senna  1 tablet Oral Daily  . tiotropium  18 mcg Inhalation Daily   Continuous Infusions: . dextrose 5 % and 0.9% NaCl 125 mL/hr at 08/06/14 0630   Faye Ramsay, MD  [facility] Pager [phone]  If 7PM-7AM, please contact night-coverage [email or web address] Password [email or web address] 08/06/2014, 10:07 AM   LOS: 1 day

## 2014-08-06 NOTE — Consult Note (Signed)
Patient VZ:DGLOVFI Nicholas Soto      DOB: 1951/01/29      EPP:[id number]     Consult Note from the Palliative Medicine Team at [facility] Requested by: Dr Doyle Askew    PCP: [name], MD Reason for Consultation: Clarification of [place] and options     Phone Number:[phone]  Assessment of patients Current state:  Continued physical, functional decline 2/2 to metastatic cancer of unknown primary.  Difficulty with pain management, multiple re hospitalizations.   Patient and family faced with advanced directive decisions and anticipatory care needs   Consult is for review of medical treatment options, clarification of goals of care and end of life issues, disposition and options, and symptom recommendation.  This NP Nicholas Soto reviewed medical records, received report from team, assessed the patient and then meet at the patient's bedside along with his wife, children and several other family members  to discuss diagnosis prognosis, [place], EOL wishes disposition and options.  A detailed discussion was had today regarding advanced directives.  Concepts specific to code status, artifical feeding and hydration, continued IV antibiotics and rehospitalization was had.  The difference between a aggressive medical intervention path  and a palliative comfort care path for this patient at this time was had.  Values and goals of care important to patient and family were attempted to be elicited.  Concept of Hospice and Palliative Care were discussed  Natural trajectory and expectations at EOL were discussed.  Questions and concerns addressed.  Hard Choices booklet left for review. Family encouraged to call with questions or concerns.  PMT will continue to support holistically.   Goals of Care: 1.  Code Status:DNR/DNI- comfort is main focus of care   2. Scope of Treatment: 1. Vital Signs: per unti 2. Respiratory/Oxygen:as needed for comfort 3. Nutritional Support/Tube Feeds:no  artifical feeding now or in the future 4. Antibiotics:  Consider oral agents to promote comfort 5. IVF: no further once discharged home 6. Labs:until discharge  7. Consults:  Await Oncology input   3. Disposition:  Hoping to go home with hospice once pain is under control   4. Symptom Management:   1. Anxiety/Agitation: Ativan 1 mg every 6 hrs prn 2. Pain/ Back:  Difficult pain contol over the past several weeks.  Difficult for patient to maintain oral intake, each time patient is discharged home it rapidly becomes difficult to maintain hydration and ability to take large doses of oral medications       Recommend: -PICC placement for symptom management at home (will assist hospice in management of anticipated difficult symptom control).  After PICC is placed begin to shift to IV continuous infusion and,  I will follow up in the am to begin minimizing medications and initiating continuous gtt once PICC is in  place           3. Bowel Regimen: Senna-s one tablet every hs                                     Dulcolax supp one pr prn daily   5. Psychosocial: Emotional support offered to patient and family.  They had many questions and concerns but all are able to support this patient in his decision for a comfort path.   Patient Documents Completed or Given: Document Given Completed  Advanced Directives Pkt    MOST    DNR  Gone from My Sight    Hard Choices yes     Brief HPI:  63 y.o. male with rapid physical and functional decline 2/2 bone metastasis with unknown primary.  Seen by Dr Chism/oncology and Dr Kipp Brood.  Difficult pain management issue, on high doses of oral agents PMH for hypertension, COPD, left eye cancer status post radiation treatment, neuropathy with chronic foot pain,   Decision is to shift to comfort approach and hope to to go home with hospice services in place   ROS: weakness, fatigue, weight loss, poor appetite   PMH:  Past Medical History   Diagnosis Date  . Chest pain     previous CP thought r/t GI; mild CAD by cath 2002  . Hypertension     pt denies h/o htn  . COPD (chronic obstructive pulmonary disease)   . Hyperlipidemia     taken off statin 1 mo ago by primary care bc he was "put on a new med that might interfere"  . Tobacco abuse   . 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 Gout   . Shortness of breath   . Syncope   . Cancer Left    of the eye  . 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 Shoulder surgery    . Neck surgery    . Eye surgery      cancer  . Spine surgery    . Knee cartilage surgery    . Cholecystectomy     I have reviewed the FH and SH and  If appropriate update it with new information. Allergies  Allergen Reactions  . Meperidine Hcl Nausea And Vomiting   Scheduled Meds: . budesonide-formoterol  2 puff Inhalation QHS  . dexamethasone  4 mg Oral 4 times per day  . feeding supplement (ENSURE COMPLETE)  237 mL Oral TID BM  . [START ON 08/07/2014] fentaNYL  75 mcg Transdermal Q3 days  . heparin  5,000 Units Subcutaneous 3 times per day  . mirtazapine  15 mg Oral QHS  . montelukast  10 mg Oral q morning - 10a  . nicotine  21 mg Transdermal Daily  . OxyCODONE  120 mg Oral 3 times per day  . pantoprazole  40 mg Oral Daily  . PARoxetine  10 mg Oral q morning - 10a  . pregabalin  75 mg Oral BID  . [START ON 08/07/2014] scopolamine  1 patch Transdermal Q72H  . senna  1 tablet Oral Daily  . sodium chloride  3 mL Intravenous Q12H  . tiotropium  18 mcg Inhalation Daily   Continuous Infusions: . dextrose 5 % and 0.9% NaCl 30 mL/hr at 08/06/14 1201   PRN Meds:.sodium chloride, albuterol, HYDROmorphone (DILAUDID) injection, LORazepam, nitroGLYCERIN, ondansetron, oxyCODONE, polyethylene glycol, promethazine, sodium chloride    BP 111/53  Pulse 75  Temp(Src) 97.6 F (36.4 C) (Axillary)  Resp 16  SpO2 96%    PPS:30 % at best   Intake/Output Summary (Last 24 hours) at 08/06/14 1304 Last data filed at 08/06/14 0340  Gross per 24 hour  Intake      0 ml  Output    500 ml  Net   -500 ml    Physical Exam:  General: ill appearing HEENT: dry buccal membranes, no exudate noted, audible throat secretions Chest:   Decreased in bases CVS: RRR Abdomen: soft NT +  BS Ext: without edema Neuro: alert and oriented/engaed in today's conversation, weak and lethargic  Labs: CBC    Component Value Date/Time   WBC 5.5 08/06/2014 0525   RBC 3.36* 08/06/2014 0525   HGB 10.1* 08/06/2014 0525   HCT 29.4* 08/06/2014 0525   PLT 100* 08/06/2014 0525   MCV 87.5 08/06/2014 0525   MCH 30.1 08/06/2014 0525   MCHC 34.4 08/06/2014 0525   RDW 14.4 08/06/2014 0525   LYMPHSABS 0.2* 08/19/2014 1615   MONOABS 0.3 08/21/2014 1615   EOSABS 0.0 08/09/2014 1615   BASOSABS 0.0 07/27/2014 1615    BMET    Component Value Date/Time   NA 131* 08/06/2014 0525   K 4.1 08/06/2014 0525   CL 94* 08/06/2014 0525   CO2 28 08/06/2014 0525   GLUCOSE 124* 08/06/2014 0525   BUN 19 08/06/2014 0525   CREATININE 0.52 08/06/2014 0525   CALCIUM 7.8* 08/06/2014 0525   GFRNONAA >90 08/06/2014 0525   GFRAA >90 08/06/2014 0525    CMP     Component Value Date/Time   NA 131* 08/06/2014 0525   K 4.1 08/06/2014 0525   CL 94* 08/06/2014 0525   CO2 28 08/06/2014 0525   GLUCOSE 124* 08/06/2014 0525   BUN 19 08/06/2014 0525   CREATININE 0.52 08/06/2014 0525   CALCIUM 7.8* 08/06/2014 0525   PROT 4.9* 08/06/2014 0525   ALBUMIN 2.0* 08/06/2014 0525   AST 24 08/06/2014 0525   ALT 14 08/06/2014 0525   ALKPHOS 80 08/06/2014 0525   BILITOT 0.4 08/06/2014 0525   GFRNONAA >90 08/06/2014 0525   GFRAA >90 08/06/2014 0525    Time In Time Out Total Time Spent with Patient Total Overall Time  1130 1300 80 min 90 min    Greater than 50%  of this time was spent counseling and coordinating care related to the above assessment and plan.   Nicholas Lessen NP  Palliative  Medicine Team Team Phone # [phone] Pager [phone]  Paged Dr Doyle Askew to discuss above

## 2014-08-07 ENCOUNTER — Inpatient Hospital Stay (HOSPITAL_COMMUNITY): Payer: 59

## 2014-08-07 ENCOUNTER — Ambulatory Visit: Payer: 59

## 2014-08-07 ENCOUNTER — Inpatient Hospital Stay: Payer: 59

## 2014-08-07 ENCOUNTER — Other Ambulatory Visit: Payer: 59

## 2014-08-07 DIAGNOSIS — C801 Malignant (primary) neoplasm, unspecified: Secondary | ICD-10-CM

## 2014-08-07 DIAGNOSIS — E43 Unspecified severe protein-calorie malnutrition: Secondary | ICD-10-CM

## 2014-08-07 DIAGNOSIS — D649 Anemia, unspecified: Secondary | ICD-10-CM

## 2014-08-07 DIAGNOSIS — R627 Adult failure to thrive: Secondary | ICD-10-CM

## 2014-08-07 DIAGNOSIS — M549 Dorsalgia, unspecified: Secondary | ICD-10-CM

## 2014-08-07 DIAGNOSIS — R109 Unspecified abdominal pain: Secondary | ICD-10-CM

## 2014-08-07 DIAGNOSIS — Z85820 Personal history of malignant melanoma of skin: Secondary | ICD-10-CM

## 2014-08-07 LAB — CBC
HEMATOCRIT: 29.1 % — AB (ref 39.0–52.0)
Hemoglobin: 10.1 g/dL — ABNORMAL LOW (ref 13.0–17.0)
MCH: 30.1 pg (ref 26.0–34.0)
MCHC: 34.7 g/dL (ref 30.0–36.0)
MCV: 86.9 fL (ref 78.0–100.0)
Platelets: 105 10*3/uL — ABNORMAL LOW (ref 150–400)
RBC: 3.35 MIL/uL — AB (ref 4.22–5.81)
RDW: 14.4 % (ref 11.5–15.5)
WBC: 5.2 10*3/uL (ref 4.0–10.5)

## 2014-08-07 LAB — BASIC METABOLIC PANEL
ANION GAP: 9 (ref 5–15)
BUN: 13 mg/dL (ref 6–23)
CHLORIDE: 96 meq/L (ref 96–112)
CO2: 28 mEq/L (ref 19–32)
CREATININE: 0.48 mg/dL — AB (ref 0.50–1.35)
Calcium: 7.9 mg/dL — ABNORMAL LOW (ref 8.4–10.5)
GFR calc Af Amer: >90 mL/min (ref >90)
GFR calc non Af Amer: >90 mL/min (ref >90)
Glucose, Bld: 120 mg/dL — ABNORMAL HIGH (ref 70–99)
Potassium: 3.9 mEq/L (ref 3.7–5.3)
Sodium: 133 mEq/L — ABNORMAL LOW (ref 137–147)

## 2014-08-07 MED ORDER — MORPHINE BOLUS VIA INFUSION
1.0000 mg | INTRAVENOUS | Status: DC | PRN
  Administered 2014-08-08 – 2014-08-09 (×11): 1 mg via INTRAVENOUS
  Filled 2014-08-07: qty 1

## 2014-08-07 MED ORDER — MORPHINE SULFATE 10 MG/ML IJ SOLN
4.0000 mg/h | INTRAVENOUS | Status: DC
  Administered 2014-08-07: 3 mg/h via INTRAVENOUS
  Administered 2014-08-08: 4 mg/h via INTRAVENOUS
  Administered 2014-08-09: 8 mg/h via INTRAVENOUS
  Administered 2014-08-10: 10 mg/h via INTRAVENOUS
  Filled 2014-08-07 (×4): qty 10

## 2014-08-07 MED ORDER — SODIUM CHLORIDE 0.9 % IJ SOLN
10.0000 mL | INTRAMUSCULAR | Status: DC | PRN
  Administered 2014-08-08: 10 mL

## 2014-08-07 MED ORDER — LORAZEPAM 1 MG PO TABS
2.0000 mg | ORAL_TABLET | Freq: Four times a day (QID) | ORAL | Status: DC | PRN
  Administered 2014-08-07: 2 mg via ORAL
  Filled 2014-08-07: qty 2

## 2014-08-07 MED ORDER — OXYCODONE HCL ER 80 MG PO T12A: 80.0000 mg | EXTENDED_RELEASE_TABLET | Freq: Three times a day (TID) | ORAL | Status: DC

## 2014-08-07 NOTE — Progress Notes (Signed)
Nicholas Soto   DOB:[birth year]   TG#:[id number]   AXE#:[id number]  Subjective: Patient and wife at bedside. He reports that his pain is better. PICC line placed.    Objective:  Filed Vitals:   08/07/14 0558  BP: 111/55  Pulse: 77  Temp: 98.1 F (36.7 C)  Resp: 18    There is no weight on file to calculate BMI.  Intake/Output Summary (Last 24 hours) at 08/07/14 1351 Last data filed at 08/07/14 0605  Gross per 24 hour  Intake 2720.75 ml  Output   1200 ml  Net 1520.75 ml    Chronically ill appearing  Sclerae unicteric  Oropharynx clear  R PICC line in place  CBG (last 3)  No results found for this basename: GLUCAP,  in the last 72 hours   Labs:  Lab Results  Component Value Date   WBC 5.2 08/07/2014   HGB 10.1* 08/07/2014   HCT 29.1* 08/07/2014   MCV 86.9 08/07/2014   PLT 105* 08/07/2014   NEUTROABS 5.8 01/05/3158    Basic Metabolic Panel:  Recent Labs Lab 07/27/2014 1615 08/06/14 0525 08/07/14 0530  NA 131* 131* 133*  K 4.6 4.1 3.9  CL 91* 94* 96  CO2 28 28 28   GLUCOSE 91 124* 120*  BUN 24* 19 13  CREATININE 0.46* 0.52 0.48*  CALCIUM 8.2* 7.8* 7.9*  MG 2.1  --   --   PHOS 5.1*  --   --    GFR The CrCl is unknown because both a height and weight (above a minimum accepted value) are required for this calculation. Liver Function Tests:  Recent Labs Lab 08/06/14 0525  AST 24  ALT 14  ALKPHOS 80  BILITOT 0.4  PROT 4.9*  ALBUMIN 2.0*   No results found for this basename: LIPASE, AMYLASE,  in the last 168 hours No results found for this basename: AMMONIA,  in the last 168 hours Coagulation profile No results found for this basename: INR, PROTIME,  in the last 168 hours  CBC:  Recent Labs Lab 08/02/2014 1615 08/06/14 0525 08/07/14 0530  WBC 6.3 5.5 5.2  NEUTROABS 5.8  --   --   HGB 11.4* 10.1* 10.1*  HCT 33.4* 29.4* 29.1*  MCV 87.0 87.5 86.9  PLT 127* 100* 105*   Cardiac Enzymes: No results found for this basename: CKTOTAL, CKMB, CKMBINDEX,  TROPONINI,  in the last 168 hours BNP: No components found with this basename: POCBNP,  CBG: No results found for this basename: GLUCAP,  in the last 168 hours D-Dimer No results found for this basename: DDIMER,  in the last 72 hours Hgb A1c No results found for this basename: HGBA1C,  in the last 72 hours Lipid Profile No results found for this basename: CHOL, HDL, LDLCALC, TRIG, CHOLHDL, LDLDIRECT,  in the last 72 hours Thyroid function studies No results found for this basename: TSH, T4TOTAL, FREET3, T3FREE, THYROIDAB,  in the last 72 hours Anemia work up No results found for this basename: VITAMINB12, FOLATE, FERRITIN, TIBC, IRON, RETICCTPCT,  in the last 72 hours Microbiology No results found for this or any previous visit (from the past 240 hour(s)).  PATHOLOGY: Diagnosis Soft Tissue Needle Core Biopsy - POORLY DIFFERENTIATED CARCINOMA, SEE COMMENT. Microscopic Comment The carcinoma demonstrates the following immunophenotype: Cytokeratin AE1/3 - strong diffuse expression. CDX2 - negative expression. TTF-1 - negative expression. PSA - negative expression. Chromogranin - negative expression. CD56 - negative expression. Melan-A - negative expression. S-100 - negative expression. Cytokeratin 5/6 -  negative expression. Cytokeratin 7 - strong diffuse expression. Cytokeratin 20 - negative expression. Cytokeratin 903 - patchy moderate to strong expression. Hep Par 1 - focal moderate to strong expression. Inhibin - negative expression. Napsin A - negative expression. p63 - negative expression. Mucicarmine - focal moderate to strong expression. Overall the morphology and immunophenotype are that of nonspecific poorly differentiated carcinoma with focal areas of adenocarcinomatous differentiation. Although the immunophenotype is nonspecific, the immunophenotype does not support melanocytic differentiation, prostatic primary, lung primary, neuroendocrine differentiation, lower  gastrointestinal differentiation, adrenocortical origin or hepatocellular origin. The case was reviewed with Dr Gari Crown who essentially concurs. (CRR:ecj 07/18/2014) [name] DO Pathologist, Electronic Signature (Case signed 07/18/2014)  FURTHER PATHOLOGY: Diagnosis Retroperitoneal mass, biopsy, right - METASTATIC POORLY DIFFERENTIATED ADENOCARCINOMA. PLEASE SEE COMMENT. Microscopic Comment The biopsies show poorly differentiated malignant cells with significant nuclear pleomorphism, intracytoplasmic mucin and some clear cytoplasm with associated tumor necrosis, arranged in acinar and cribriform patterns. Extensive immunostains were performed in the previous case ([id number]). Given the morphologic features, positive immunoreactivity of the tumor cells to cytokeratin 7, CKAE1/3/AE3, and negative reactivity to p63, a metastatic lung primary tumor cannot be completely excluded. Per my conversation with Dr. [name], the tumor block will be sent for [facility] for molecular analysis and the report will be available in EPIC. (HCL:kh 07-23-14) Aldona Bar MD Pathologist, Electronic Signature (Case signed 07/23/2014)  Molecular pathology: [facility] 08/11.   Genomic alterations detected: BRAF, ATM, FANCA, TP53, ATRX, LRP1B, SETD2  Studies:  Dg Chest Portable 1 View  08/25/2014   CLINICAL DATA:  Chest pain with history of tobacco use, hypertension and COPD. Patient has history of ocular malignancy. And lower crash and lumbar spine tumor  EXAM: PORTABLE CHEST - 1 VIEW  COMPARISON:  CT scan of the chest of August 01, 2014 and portable chest x-ray of July 29, 2014.  FINDINGS: Right paratracheal lymphadenopathy persists. The lungs are adequately inflated. The interstitial markings have increased bilaterally since the previous study. The cardiac silhouette is normal in size. The pulmonary vascularity is not clearly engorged.  IMPRESSION: Mildly increased interstitial markings bilaterally are  slightly more conspicuous than in the past. This may reflect interstitial edema of cardiac or noncardiac cause. There is no focal pneumonia.   Electronically Signed   By: Delan Ksiazek  [place]   On: 07/29/2014 16:42   CT CHEST WITH CONTRAST   08/01/2014  Multidetector CT imaging of the chest was performed during  intravenous contrast administration. CONTRAST: 81m OMNIPAQUE IOHEXOL 300 MG/ML SOLN COMPARISON: 07/13/2014 FINDINGS: Since the prior chest CT, the patient has developed an acute pneumonia within the posterior left lower lobe. Associated trace amount of adjacent pleural fluid is seen. Since the prior CT, there is mild progression of diffuse metastatic lymphadenopathy in the right lower neck, mediastinum, right hilum and both axillary regions. When obtaining similar measurements, all of the metastatic lymph nodes appear mildly enlarged. A right chest wall metastasis also appears mildly enlarged. There is stable narrowing of the left brachycephalic vein and upper SVC by lymphadenopathy without venous occlusion. Bony lesions involving the spine appear relatively stable. Multiple  peritoneal metastatic nodules are again visualized in the upper abdomen. IMPRESSION: 1. Development of new acute pneumonia in the posterior left lower lobe with associated trace amount of adjacent pleural fluid. 2. Mild progression of diffuse metastatic lymphadenopathy in the  chest with all of the lymph node showing slightly larger dimensions. A right chest wall metastasis also appears mildly enlarged  CT CHEST, ABDOMEN, AND PELVIS  WITH CONTRAST  07/18/2015TECHNIQUE: Multidetector CT imaging of the chest, abdomen and pelvis was performed following the standard protocol during bolus administration of intravenous contrast. CONTRAST: 163m OMNIPAQUE IOHEXOL 300 MG/ML SOLN COMPARISON: Chest CT on 11/10/2011  FINDINGS: CT CHEST FINDINGS Bulky mediastinal lymphadenopathy is seen in the superior mediastinum, prevascular space, right  paratracheal region, lateral  aortic, and subcarinal regions. Largest index area of  lymphadenopathy in the right paratracheal region measures 4.9 x 5.9 cm on image 18, and there is compression of the left brachycephalic vein without thrombosis. Mild lymphadenopathy is also seen in the right lower jugular chain and supraclavicular regions as well as the right axilla. There is also mild adenopathy in the right and left pericardial spaces. , consistent metastatic disease. A 7 mm pulmonary nodule is seen in the right middle lobe on image 34 and a 4 mm nodule is seen in the anterior left upper lobe on image  23. These nodules were not seen on previous study and tiny pulmonary metastases cannot be excluded. A 4 mm pulmonary nodule in the left lower lobe on image 45 is stable since previous study in 2012 and likely benign. No evidence of pleural effusion although small subpleural metastases  measuring up to 1 cm are seen bilaterally on images 29 and 28. In addition, there are small metastases seen in the subcutaneous tissues of the chest wall bilaterally. Several lytic bone metastases are seen involving the thoracic spine and right lateral sixth rib. CT ABDOMEN AND PELVIS FINDINGS Benign appearing cyst again seen in the posterior right hepatic lobe as well as a few other tiny sub-cm cyst which appears stable. No definite liver metastases are seen. There are several small ill-defined low-attenuation lesions in the spleen, suspicious for splenic metastases. Kidneys and pancreas are unremarkable in appearance. No evidence of hydronephrosis. Bilateral retrocrural lymphadenopathy demonstrated, consistent metastatic disease. There are numerous soft tissue nodules throughout the mesenteric and retroperitoneal fat throughout the abdomen pelvis, consistent with diffuse metastatic disease. Largest  in the right posterior para renal space measures 4.6 x 6.0 cm on image 88. There are also numerous metastatic soft tissue nodule  seen throughout the abdominal and pelvic wall soft tissues bilaterally. Numerous lytic bone metastases are seen involving the lumbar spine, pelvis, and hips. No evidence of bowel obstruction. No evidence of inflammatory  process, abscess, or ascites. IMPRESSION: Bulky mediastinal lymphadenopathy with mild lymphadenopathy also seen in the right axilla, bilateral pericardial spaces, and  retrocrural regions, consistent metastatic disease. Diffuse body wall soft tissue metastases throughout the chest,  abdomen, and pelvis. Diffuse mesenteric and retroperitoneal metastases throughout the abdomen and pelvis. Diffuse lytic bone metastases. Probable small splenic metastases.  Assessment: 63y.o.  Plan:  1. Metastatic Adenocarcinoma of Unknown Primary --We also reviewed case with Dr. MTresa Mooreof Pathology today  His diagnosis is consistent with metastatic adenocarcinoma of unknown primary (favoring a lung etiology).  Molecular testing as noted above.  Given his failure to thrive and poor functional status, he is not a candidate for aggressive chemotherapy.  He is being followed by palliative care and is agreeable to hospice referral.  His life expectancy is less than 6 months with best supportive therapy.  He understands the goal of his care will be focused on QOL measures.  He has had very poor by mouth intake and an increase in his pain requirements.  2. Intractable right groin, back pain secondary to # 1.  --Completed radiation per Radiation oncology.  --Taper down Decadron 4 mg every  6 hours to every 8 hours. --Agree with PICC placement to facilitate intravenous dilaudid prn.  --continue oxycodone 120 mg every 8 hours  3. FTT/ ECOG 3. --Continue nutritional supplementation prn. Albumin is 2.0. --He has limited mobility due to #2, 3.   4. 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  --No vision changes.  One may consider mRI of the brain if brain mets are suspected, pending of formal diagnosis to complete  staging.   5. Anemia, mild. --He is asymptomatic. His plts are 105,000.   6. DNR/DNI --Referral to hospice done. Palliative Care evaluation ongoing to help with symptoms  Trayden Brandy, MD 08/07/2014  1:51 PM

## 2014-08-07 NOTE — Progress Notes (Signed)
Progress Note from the Palliative Medicine Team at [facility]:   -patient is resting, wife at bedside  -both are comfortable with plan to focus on comfort and are hopeful for a discharge home with hospice when pain management strategies are effective  and logistics handled  -family express appreciation for Dr [name] visit today  -anticipate conversation with hospice in the morning   Objective: Allergies  Allergen Reactions  . Meperidine Hcl Nausea And Vomiting   Scheduled Meds: . budesonide-formoterol  2 puff Inhalation QHS  . dexamethasone  4 mg Oral 4 times per day  . feeding supplement (ENSURE COMPLETE)  237 mL Oral TID BM  . fentaNYL  75 mcg Transdermal Q3 days  . heparin  5,000 Units Subcutaneous 3 times per day  . mirtazapine  15 mg Oral QHS  . montelukast  10 mg Oral q morning - 10a  . nicotine  21 mg Transdermal Daily  . OxyCODONE  120 mg Oral 3 times per day  . pantoprazole  40 mg Oral Daily  . PARoxetine  10 mg Oral q morning - 10a  . pregabalin  75 mg Oral BID  . scopolamine  1 patch Transdermal Q72H  . senna-docusate  1 tablet Oral QHS  . sodium chloride  3 mL Intravenous Q12H  . tiotropium  18 mcg Inhalation Daily   Continuous Infusions: . dextrose 5 % and 0.9% NaCl 30 mL/hr at 08/06/14 1201   PRN Meds:.sodium chloride, albuterol, bisacodyl, gi cocktail, HYDROmorphone (DILAUDID) injection, LORazepam, nitroGLYCERIN, ondansetron, oxyCODONE, polyethylene glycol, promethazine, sodium chloride, sodium chloride  BP 111/55  Pulse 77  Temp(Src) 98.1 F (36.7 C) (Oral)  Resp 18  SpO2 98%   PPS: 30%  Pain Score: 4/10 Pain Location back   Intake/Output Summary (Last 24 hours) at 08/07/14 1231 Last data filed at 08/07/14 0605  Gross per 24 hour  Intake 2720.75 ml  Output   1200 ml  Net 1520.75 ml        Physical Exam:  General: chronically  ill appearing, NAD HEENT: dry buccal membranes, no exudate noted, audible throat secretions  Chest:  Decreased in bases  CVS: RRR  Abdomen: soft NT +BS  Ext: without edema  Neuro: alert and oriented/engaed in today's conversation, weak and lethargic  Labs: CBC    Component Value Date/Time   WBC 5.2 08/07/2014 0530   RBC 3.35* 08/07/2014 0530   HGB 10.1* 08/07/2014 0530   HCT 29.1* 08/07/2014 0530   PLT 105* 08/07/2014 0530   MCV 86.9 08/07/2014 0530   MCH 30.1 08/07/2014 0530   MCHC 34.7 08/07/2014 0530   RDW 14.4 08/07/2014 0530   LYMPHSABS 0.2* 08/13/2014 1615   MONOABS 0.3 08/09/2014 1615   EOSABS 0.0 08/09/2014 1615   BASOSABS 0.0 08/03/2014 1615    BMET    Component Value Date/Time   NA 133* 08/07/2014 0530   K 3.9 08/07/2014 0530   CL 96 08/07/2014 0530   CO2 28 08/07/2014 0530   GLUCOSE 120* 08/07/2014 0530   BUN 13 08/07/2014 0530   CREATININE 0.48* 08/07/2014 0530   CALCIUM 7.9* 08/07/2014 0530   GFRNONAA >90 08/07/2014 0530   GFRAA >90 08/07/2014 0530    CMP     Component Value Date/Time   NA 133* 08/07/2014 0530   K 3.9 08/07/2014 0530   CL 96 08/07/2014 0530   CO2 28 08/07/2014 0530   GLUCOSE 120* 08/07/2014 0530   BUN 13 08/07/2014 0530   CREATININE 0.48* 08/07/2014  0530   CALCIUM 7.9* 08/07/2014 0530   PROT 4.9* 08/06/2014 0525   ALBUMIN 2.0* 08/06/2014 0525   AST 24 08/06/2014 0525   ALT 14 08/06/2014 0525   ALKPHOS 80 08/06/2014 0525   BILITOT 0.4 08/06/2014 0525   GFRNONAA >90 08/07/2014 0530   GFRAA >90 08/07/2014 0530      Assessment and Plan: 1. Code Status: DNR/DNI-comfort is main focus pf care 2. Symptom Control: Pain/Dyspnea: now that mid-line is place begin conversion to continuous morphine gtt        -convert total daily dose to IV equivalent and decrease by 25 %          (Morphine IV start gtt at 3/ml hr, utilize 1/mg bolus every 15 minutes, if need of three or more boluses in            one hr titrate gtt up by 1mg /hr)        -dc Oxycodone/Fentanly patch Anxiety: Ativan 2 mg po every 6 hrs prn Dysphagia:  Diet as tolerated.  Patient is verbalizing more and  more difficulty with swallow.  We discussed the concept of swish and spit Frequent mouth care  3. Psycho/Social: Emotional support offered to patient and his wife.  Patient is tearful expressing his feelings  facing his own mortality and eminent death.   Prognosis is likely days to weeks.  Coralyn Mark his wife verbalizing to her husband her feelings of love and gratitude to their relationship and life together. 4. Spiritual: Chaplain consulted  5. Disposition: Hopefully home with hospice when  Patient Documents Completed or Given: Document Given Completed  Advanced Directives Pkt    MOST    DNR    Gone from My Sight    Hard Choices yes     Time In Time Out Total Time Spent with Patient Total Overall Time  1600 1715 70 min 75 min    Greater than 50%  of this time was spent counseling and coordinating care related to the above assessment and plan.  Wadie Lessen NP  Palliative Medicine Team Team Phone # [phone] Pager [phone]  Discussed with Dr Grandville Silos and Dr [name] 1

## 2014-08-07 NOTE — Progress Notes (Signed)
During PICC procedure, cannulated basilic vein easily but PICC catheter kept going up right IJ vein rather than going into the SVC.  I will power flush PICC and sit up patient for about 25min, hoping that with the aid of gravity, the PICC will descend into the SVC.  Will confirm placement with portable chest Xray per protocol.

## 2014-08-07 NOTE — Progress Notes (Signed)
Peripherally Inserted Central Catheter/Midline Placement  The IV Nurse has discussed with the patient and/or persons authorized to consent for the patient, the purpose of this procedure and the potential benefits and risks involved with this procedure.  The benefits include less needle sticks, lab draws from the catheter and patient may be discharged home with the catheter.  Risks include, but not limited to, infection, bleeding, blood clot (thrombus formation), and puncture of an artery; nerve damage and irregular heat beat.  Alternatives to this procedure were also discussed.  PICC/Midline Placement Documentation        Nicholas Soto 08/07/2014, 12:24 PM

## 2014-08-07 NOTE — Progress Notes (Signed)
Nutrition Brief Note  Chart reviewed. Admitted with continued physical, functional decline 2/2 to metastatic cancer of unknown primary. Palliative care met with pt yesterday.  Pt now transitioning to comfort care, no artifical feeding or in the future per palliative care notes.  No nutrition interventions warranted at this time.  Please consult as needed.   Carlis Stable MS, [place], LDN [phone] Pager [phone] Weekend/After Hours Pager

## 2014-08-07 NOTE — Progress Notes (Signed)
Nicholas Soto PROGRESS NOTE  QUADRY KAMPA JFH:[id number] DOB: 1951/03/27 DOA: 08/20/2014 PCP: [name], MD  Assessment/Plan: #1. Intractable cancer related pain/fatigue/weakness in setting of widespread bony metastases of unknown primary Patient completed radiation therapy, but deteriorated. Continue decadron. Continue pain management with duragesic patch, oxycontin 120mg  BID, lyrica. Patient for midline for IV pain management on D/C. Palliative care helping with pain management.  #2 AOCD H/H stable. Follow.  #3 COPD Stable. Continue BD, oxygen, PRN.  #4 Depression/ANxiety Stable.  #5 Metastatic Adenocarinoma of unknown primary Patient with FTT and poor functional status and not a candidate for agressive chemotherapy per oncology. Oncology in agreement with hospice referral and palliative care. Palliative care following and I appreciate input and recommendations.  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  #8 thrombocytopenia Stable.  #9 prophylaxis PPI for GI prophylaxis. Heparin for DVT prophylaxis.  Code Status: DNR Family Communication: updated patient and wife at bedside. Disposition Plan: Home hopefully with hospice in 1-2 days.   Consultants:  Oncology: Dr. [name] 08/07/2014  Palliative care Wadie Lessen, NP 08/06/2014  Procedures:  PICC line 08/07/2014>>>> 08/07/2014  Chest x-ray 07/28/2014, 08/07/2014  Antibiotics:  None  HPI/Subjective: Patient states back pain is better controlled.  Objective: Filed Vitals:   08/07/14 0558  BP: 111/55  Pulse: 77  Temp: 98.1 F (36.7 C)  Resp: 18    Intake/Output Summary (Last 24 hours) at 08/07/14 1310 Last data filed at 08/07/14 9373  Gross per 24 hour  Intake 2720.75 ml  Output   1200 ml  Net 1520.75 ml   There were no vitals filed for this  visit.  Exam:   General:  NAD  Cardiovascular: RRR  Respiratory: CTAB  Abdomen: Soft, nontender, nondistended, positive bowel sounds  Musculoskeletal: No clubbing cyanosis or edema   Data Reviewed: Basic Metabolic Panel:  Recent Labs Lab 08/20/2014 1615 08/06/14 0525 08/07/14 0530  NA 131* 131* 133*  K 4.6 4.1 3.9  CL 91* 94* 96  CO2 28 28 28   GLUCOSE 91 124* 120*  BUN 24* 19 13  CREATININE 0.46* 0.52 0.48*  CALCIUM 8.2* 7.8* 7.9*  MG 2.1  --   --   PHOS 5.1*  --   --    Liver Function Tests:  Recent Labs Lab 08/06/14 0525  AST 24  ALT 14  ALKPHOS 80  BILITOT 0.4  PROT 4.9*  ALBUMIN 2.0*   No results found for this basename: LIPASE, AMYLASE,  in the last 168 hours No results found for this basename: AMMONIA,  in the last 168 hours CBC:  Recent Labs Lab 08/20/2014 1615 08/06/14 0525 08/07/14 0530  WBC 6.3 5.5 5.2  NEUTROABS 5.8  --   --   HGB 11.4* 10.1* 10.1*  HCT 33.4* 29.4* 29.1*  MCV 87.0 87.5 86.9  PLT 127* 100* 105*   Cardiac Enzymes: No results found for this basename: CKTOTAL, CKMB, CKMBINDEX, TROPONINI,  in the last 168 hours BNP (last 3 results) No results found for this basename: PROBNP,  in the last 8760 hours CBG: No results found for this basename: GLUCAP,  in the last 168 hours  No results found for this or any previous visit (from the past 240 hour(s)).   Studies: Dg Chest Portable 1 View  07/30/2014   CLINICAL DATA:  Chest pain with history of tobacco use, hypertension and COPD. Patient has history of ocular malignancy. And  lower crash and lumbar spine tumor  EXAM: PORTABLE CHEST - 1 VIEW  COMPARISON:  CT scan of the chest of August 01, 2014 and portable chest x-ray of July 29, 2014.  FINDINGS: Right paratracheal lymphadenopathy persists. The lungs are adequately inflated. The interstitial markings have increased bilaterally since the previous study. The cardiac silhouette is normal in size. The pulmonary vascularity is not clearly  engorged.  IMPRESSION: Mildly increased interstitial markings bilaterally are slightly more conspicuous than in the past. This may reflect interstitial edema of cardiac or noncardiac cause. There is no focal pneumonia.   Electronically Signed   By: David  [place]   On: 08/08/2014 16:42    Scheduled Meds: . budesonide-formoterol  2 puff Inhalation QHS  . dexamethasone  4 mg Oral 4 times per day  . feeding supplement (ENSURE COMPLETE)  237 mL Oral TID BM  . fentaNYL  75 mcg Transdermal Q3 days  . heparin  5,000 Units Subcutaneous 3 times per day  . mirtazapine  15 mg Oral QHS  . montelukast  10 mg Oral q morning - 10a  . nicotine  21 mg Transdermal Daily  . OxyCODONE  120 mg Oral 3 times per day  . pantoprazole  40 mg Oral Daily  . PARoxetine  10 mg Oral q morning - 10a  . pregabalin  75 mg Oral BID  . scopolamine  1 patch Transdermal Q72H  . senna-docusate  1 tablet Oral QHS  . sodium chloride  3 mL Intravenous Q12H  . tiotropium  18 mcg Inhalation Daily   Continuous Infusions: . dextrose 5 % and 0.9% NaCl 30 mL/hr at 08/06/14 1201    Principal Problem:   Intractable pain Active Problems:   Pain, cancer   TOBACCO ABUSE   COPD exacerbation   Bone metastasis   Severe protein-calorie malnutrition   Dehydration   Failure to thrive in adult   Palliative care encounter    Time spent: 66 mins    [facility] MD Nicholas Soto Pager [phone]. If 7PM-7AM, please contact night-coverage at [email or web address], password [facility] 08/07/2014, 1:10 PM  LOS: 2 days
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
## 2014-08-07 NOTE — Care Management Note (Addendum)
    Page 1 of 1   08/08/2014     11:41:00 AM CARE MANAGEMENT NOTE 08/08/2014  Patient:  Nicholas Soto, Nicholas Soto   Account Number:  [id number]  Date Initiated:  08/07/2014  Documentation initiated by:  [facility]  Subjective/Objective Assessment:   adm: progressive weakness and decreased oral intake     Action/Plan:   home with hospice   Anticipated DC Date:  08/08/2014   Anticipated DC Plan:  [place]  CM consult      PAC Choice  HOSPICE   Choice offered to / List presented to:  C-4 Adult Children           Status of service:  Completed, signed off Medicare Important Message given?   (If response is "NO", the following Medicare IM given date fields will be blank) Date Medicare IM given:   Medicare IM given by:   Date Additional Medicare IM given:   Additional Medicare IM given by:    Discharge Disposition:  [place]  Per UR Regulation:    If discussed at Long Length of Stay Meetings, dates discussed:    Comments:  08/08/14 08:50 CM notes family has chosen to pursue residential hospice rather than home hospice.  Santiago [facility] of [place] aware.  [name] aware of choice and will make arrangements.  No other CM needs were communicated.  Mariane Masters, BSN, [place].  08/07/14 11:10 CM spoke with [facility] to confirm they had received notification of pt choice.  Santiago Glad of [facility] confirmed they are following pt.  Mariane Masters, Durel Salts [phone].

## 2014-08-08 DIAGNOSIS — C7951 Secondary malignant neoplasm of bone: Principal | ICD-10-CM

## 2014-08-08 DIAGNOSIS — C7952 Secondary malignant neoplasm of bone marrow: Principal | ICD-10-CM

## 2014-08-08 LAB — BASIC METABOLIC PANEL
ANION GAP: 5 (ref 5–15)
BUN: 13 mg/dL (ref 6–23)
CO2: 32 meq/L (ref 19–32)
Calcium: 8.1 mg/dL — ABNORMAL LOW (ref 8.4–10.5)
Chloride: 93 mEq/L — ABNORMAL LOW (ref 96–112)
Creatinine, Ser: 0.48 mg/dL — ABNORMAL LOW (ref 0.50–1.35)
GFR calc Af Amer: >90 mL/min (ref >90)
GLUCOSE: 111 mg/dL — AB (ref 70–99)
POTASSIUM: 4 meq/L (ref 3.7–5.3)
SODIUM: 130 meq/L — AB (ref 137–147)

## 2014-08-08 MED ORDER — LORAZEPAM 2 MG/ML IJ SOLN
2.0000 mg | Freq: Four times a day (QID) | INTRAMUSCULAR | Status: DC | PRN
  Administered 2014-08-08 – 2014-08-09 (×4): 2 mg via INTRAVENOUS
  Filled 2014-08-08 (×4): qty 1

## 2014-08-08 MED ORDER — CETYLPYRIDINIUM CHLORIDE 0.05 % MT LIQD
7.0000 mL | Freq: Two times a day (BID) | OROMUCOSAL | Status: DC
  Administered 2014-08-09: 7 mL via OROMUCOSAL

## 2014-08-08 NOTE — Progress Notes (Signed)
Patient not able to ambulate. Pt not alert.

## 2014-08-08 NOTE — Progress Notes (Signed)
Progress Note from the Palliative Medicine Team at [facility]:   -patient continues to decline, more lethargic and taking only sips of fluids-- wife at bedside  -patient did well through the night requiring a titration of 1 mg/hr on his morphine gtt, he is now at 4mg  /hr   Objective: Allergies  Allergen Reactions  . Meperidine Hcl Nausea And Vomiting   Scheduled Meds: . budesonide-formoterol  2 puff Inhalation QHS  . dexamethasone  4 mg Oral 4 times per day  . feeding supplement (ENSURE COMPLETE)  237 mL Oral TID BM  . nicotine  21 mg Transdermal Daily  . scopolamine  1 patch Transdermal Q72H  . senna-docusate  1 tablet Oral QHS  . sodium chloride  3 mL Intravenous Q12H  . tiotropium  18 mcg Inhalation Daily   Continuous Infusions: . dextrose 5 % and 0.9% NaCl 30 mL/hr at 08/06/14 1201  . morphine 3 mg/hr (08/07/14 1817)   PRN Meds:.sodium chloride, albuterol, bisacodyl, LORazepam, morphine, nitroGLYCERIN, ondansetron, polyethylene glycol, promethazine, sodium chloride, sodium chloride  BP 103/48  Pulse 75  Temp(Src) 97.6 F (36.4 C) (Axillary)  Resp 20  SpO2 95%   PPS: 30%  Pain Score: 4/10 Pain Location back   Intake/Output Summary (Last 24 hours) at 08/08/14 0726 Last data filed at 08/08/14 0500  Gross per 24 hour  Intake  687.5 ml  Output      0 ml  Net  687.5 ml        Physical Exam:  General: chronically  ill appearing, NAD HEENT: dry buccal membranes, no exudate noted, audible throat secretions  Chest: Decreased in bases, scattered course BS CVS: RRR  Abdomen: soft NT +BS  Ext: without edema  Neuro:  weak and lethargic, arouses easily to verbal stimuli  Labs: CBC    Component Value Date/Time   WBC 5.2 08/07/2014 0530   RBC 3.35* 08/07/2014 0530   HGB 10.1* 08/07/2014 0530   HCT 29.1* 08/07/2014 0530   PLT 105* 08/07/2014 0530   MCV 86.9 08/07/2014 0530   MCH 30.1 08/07/2014 0530   MCHC 34.7 08/07/2014 0530   RDW 14.4 08/07/2014 0530    LYMPHSABS 0.2* 08/25/2014 1615   MONOABS 0.3 08/04/2014 1615   EOSABS 0.0 08/09/2014 1615   BASOSABS 0.0 08/02/2014 1615    BMET    Component Value Date/Time   NA 130* 08/08/2014 0526   K 4.0 08/08/2014 0526   CL 93* 08/08/2014 0526   CO2 32 08/08/2014 0526   GLUCOSE 111* 08/08/2014 0526   BUN 13 08/08/2014 0526   CREATININE 0.48* 08/08/2014 0526   CALCIUM 8.1* 08/08/2014 0526   GFRNONAA >90 08/08/2014 0526   GFRAA >90 08/08/2014 0526    CMP     Component Value Date/Time   NA 130* 08/08/2014 0526   K 4.0 08/08/2014 0526   CL 93* 08/08/2014 0526   CO2 32 08/08/2014 0526   GLUCOSE 111* 08/08/2014 0526   BUN 13 08/08/2014 0526   CREATININE 0.48* 08/08/2014 0526   CALCIUM 8.1* 08/08/2014 0526   PROT 4.9* 08/06/2014 0525   ALBUMIN 2.0* 08/06/2014 0525   AST 24 08/06/2014 0525   ALT 14 08/06/2014 0525   ALKPHOS 80 08/06/2014 0525   BILITOT 0.4 08/06/2014 0525   GFRNONAA >90 08/08/2014 0526   GFRAA >90 08/08/2014 0526      Assessment and Plan: 1. Code Status: DNR/DNI-comfort is main focus pf care 2. Symptom Control: -Pain/Dyspnea: Continue to titrate morphine gtt as  needed for symptom management and to enhance comfort           (Morphine IV  Gtt now  at 4/ml hr, utilize 1/mg bolus every 15 minutes, if need of three or more boluses in one hr titrate gtt up by 1mg /hr) -Anxiety: Convert Ativan 2 mg po to IV every 6 hrs prn -Dysphagia:  Sips and chips as toerleated  -Frequent mouth care  3. Psycho/Social: Emotional support offered to patient and his wife.  Patient remains intermittently tearful    4. Spiritual: Chaplain consulted  4.    Disposition:  Discussed with patient's wife the possibility of inpatient hospice facility.   I believe that with his continued need for titration of the morphine gtt and limited prognosis (likley days) this would be a good options for both patient and family.  Wife to discuss with other family members today    Patient Documents Completed or Given: Document Given  Completed  Advanced Directives Pkt    MOST    DNR    Gone from My Sight    Hard Choices yes     Time In Time Out Total Time Spent with Patient Total Overall Time  0715 0755 40 mim 40 min    Greater than 50%  of this time was spent counseling and coordinating care related to the above assessment and plan.  Wadie Lessen NP  Palliative Medicine Team Team Phone # [phone] Pager [phone]  Discussed with Dr Grandville Silos   1450  Family is hopeful for inpatient hospice facility

## 2014-08-08 NOTE — Progress Notes (Addendum)
Notified by [facility] Judson Roch of family choice for services of [facility]) after discharge. Met with pt's wife Coralyn Mark as planned. Coralyn Mark was  very tearful as pt has declined further and she feels returning  home is an option. Emotional support offered. [name] [place] notified of wife wishes for [facility] bed. Thank you Flo Shanks RN, BSN, [facility] Liaison

## 2014-08-08 NOTE — Progress Notes (Signed)
Pt was asleep when I arrived. Pt's wife was bedside. During our visit she expressed how he will be missed. She said they have been together since they were 16. Pt had concerns that he was not hospitalized when he was throwing up in the dr's office during first visit. She said, "maybe he would have had more time if they had put him in the hospital when he was throwing up when he went to the dr. Marisa Sprinkles wife is in the blaming phase of grieving. She was tearful; could not understand why they are just getting information; and of course, concerned about losing him. Pt's niece, son and great grandson arrived during our visit.  Ernest Haber Chaplain

## 2014-08-08 NOTE — Progress Notes (Signed)
[facility] PROGRESS NOTE  Nicholas Soto ZJQ:[id number] DOB: 05-14-1951 DOA: 07/31/2014 PCP: [name], MD  Assessment/Plan: #1. Intractable cancer related pain/fatigue/weakness in setting of widespread bony metastases of unknown primary Patient completed radiation therapy, but deteriorated. Patient declining, and lethargic Continue decadron. Continue pain management with morphine drip. Palliative care helping with pain management. Patient to hospice home tomorrow.  #2 AOCD H/H stable. Follow.  #3 COPD Stable. Continue BD, oxygen, PRN.  #4 Depression/ANxiety Stable.  #5 Metastatic Adenocarinoma of unknown primary Patient with FTT and poor functional status and not a candidate for agressive chemotherapy per oncology. Oncology in agreement with hospice referral and palliative care. Palliative care following and I appreciate input and recommendations.Patient will be discharged to hospice home.  #6 failure to thrive/severe protein calorie malnutrition Patient full comfort.  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  #8 thrombocytopenia Stable.  #9 prophylaxis PPI for GI prophylaxis. Heparin for DVT prophylaxis.  Code Status: DNR Family Communication: updated patient and wife at bedside. Disposition Plan: Hospice home tommorrow   Consultants:  Oncology: Dr. [name] 08/07/2014  Palliative care Wadie Lessen, NP 08/06/2014  Procedures:  PICC line 08/07/2014>>>> 08/07/2014  Chest x-ray 07/30/2014, 08/07/2014  Antibiotics:  None  HPI/Subjective: Patient lethargic, resting comfortably.  Objective: Filed Vitals:   08/08/14 0610  BP: 103/48  Pulse: 75  Temp: 97.6 F (36.4 C)  Resp: 20    Intake/Output Summary (Last 24 hours) at 08/08/14 1204 Last data filed at 08/08/14 0500  Gross per 24 hour  Intake  687.5 ml  Output      0 ml  Net  687.5 ml   Filed Weights   08/08/14 0900  Weight: 75.615 kg (166 lb 11.2 oz)     Exam:   General:  NAD  Cardiovascular: RRR  Respiratory: CTAB  Abdomen: Soft, nontender, nondistended, positive bowel sounds  Musculoskeletal: No clubbing cyanosis or edema   Data Reviewed: Basic Metabolic Panel:  Recent Labs Lab 08/21/2014 1615 08/06/14 0525 08/07/14 0530 08/08/14 0526  NA 131* 131* 133* 130*  K 4.6 4.1 3.9 4.0  CL 91* 94* 96 93*  CO2 28 28 28  32  GLUCOSE 91 124* 120* 111*  BUN 24* 19 13 13   CREATININE 0.46* 0.52 0.48* 0.48*  CALCIUM 8.2* 7.8* 7.9* 8.1*  MG 2.1  --   --   --   PHOS 5.1*  --   --   --    Liver Function Tests:  Recent Labs Lab 08/06/14 0525  AST 24  ALT 14  ALKPHOS 80  BILITOT 0.4  PROT 4.9*  ALBUMIN 2.0*   No results found for this basename: LIPASE, AMYLASE,  in the last 168 hours No results found for this basename: AMMONIA,  in the last 168 hours CBC:  Recent Labs Lab 08/22/2014 1615 08/06/14 0525 08/07/14 0530  WBC 6.3 5.5 5.2  NEUTROABS 5.8  --   --   HGB 11.4* 10.1* 10.1*  HCT 33.4* 29.4* 29.1*  MCV 87.0 87.5 86.9  PLT 127* 100* 105*   Cardiac Enzymes: No results found for this basename: CKTOTAL, CKMB, CKMBINDEX, TROPONINI,  in the last 168 hours BNP (last 3 results) No results found for this basename: PROBNP,  in the last 8760 hours CBG: No results found for this basename: GLUCAP,  in the last 168 hours  No results found for this or any previous visit (from the past 240 hour(s)).   Studies: Dg Chest [facility] 1 [address]  08/07/2014   CLINICAL DATA:  PICC line placement. Metastatic carcinoma of left orbit.  EXAM: PORTABLE CHEST - 1 VIEW  COMPARISON:  08/21/2014  FINDINGS: Right-sided PICC line extends superiorly in the right side of the neck, above the superior aspect of the film.  Normal heart size. Right paratracheal mass/adenopathy, as detailed previously. Hyperinflation. No pleural effusion or pneumothorax. chronic interstitial thickening. Favor volume loss in the right suprahilar region.  IMPRESSION:  Right-sided PICC line extending superiorly into the right-sided of the neck. Critical test results telephoned to . Dr. Grandville Silos. at the time of interpretation at 1:51 p.m.on 08/07/2014.  COPD/chronic bronchitis with right paratracheal mass/adenopathy.  Favor volume loss and atelectasis in the right suprahilar region. Early infection or postobstructive pneumonitis could look similar.   Electronically Signed   By: Abigail Miyamoto M.D.   On: 08/07/2014 13:52    Scheduled Meds: . budesonide-formoterol  2 puff Inhalation QHS  . dexamethasone  4 mg Oral 4 times per day  . feeding supplement (ENSURE COMPLETE)  237 mL Oral TID BM  . nicotine  21 mg Transdermal Daily  . scopolamine  1 patch Transdermal Q72H  . senna-docusate  1 tablet Oral QHS  . sodium chloride  3 mL Intravenous Q12H  . tiotropium  18 mcg Inhalation Daily   Continuous Infusions: . dextrose 5 % and 0.9% NaCl 30 mL/hr at 08/06/14 1201  . morphine 4 mg/hr (08/08/14 0750)    Principal Problem:   Intractable pain Active Problems:   Pain, cancer   TOBACCO ABUSE   COPD exacerbation   Bone metastasis   Severe protein-calorie malnutrition   Dehydration   Failure to thrive in adult   Palliative care encounter    Time spent: 24 mins    [facility] MD [facility] Pager [phone]. If 7PM-7AM, please contact night-coverage at [email or web address], password [facility] 08/08/2014, 12:04 PM  LOS: 3 days

## 2014-08-08 NOTE — Progress Notes (Signed)
Patient and wife reports that patient had a BM yesterday. Good bowel sounds heard and patient is passing gas.

## 2014-08-08 NOTE — Progress Notes (Signed)
Clinical Social Work Department BRIEF PSYCHOSOCIAL ASSESSMENT 08/08/2014  Patient:  ALYX, GEE     Account Number:  [id number]     Admit date:  08/13/2014  Clinical Social Worker:  Ulyess Blossom  Date/Time:  08/08/2014 12:00 N  Referred by:  Physician  Date Referred:  08/08/2014 Referred for  Residential hospice placement   Other Referral:   Interview type:  Family Other interview type:    PSYCHOSOCIAL DATA Living Status:  WIFE Admitted from facility:   Level of care:   Primary support name:  Terri Zogg/wife/[phone] Primary support relationship to patient:  SPOUSE Degree of support available:   strong    CURRENT CONCERNS Current Concerns  Post-Acute Placement   Other Concerns:    SOCIAL WORK ASSESSMENT / PLAN [name] received referral from [facility], Sunday Spillers and [facility] RN, Flo Shanks that initial plan was for pt to return home with [facility], but pt continues to decline and recommendation now for residential hospice.    [name] met with pt wife and other family members at bedside. [name] introduced self and explained role. [name] familiar with pt from previous admission and pt wife recognized [name]. [name] provided emotional support as pt wife discussed with [name] about how much pt has declined from last time [name] involved. Pt wife discussed that pt decline has been rapid, but pt wife shared that although it has been difficult to cope with that she does not want the patient to be in pain.    [name] discussed with pt wife regarding residential hospice placement. Pt wife feels that this will be best option for pt and the care pt needs at this time. [name] provided list of residential hospice facilities. Pt wife discussed that first choice would be [facility] given that pt family had initially chosen [facility] to follow. [name] discussed with pt wife about secondary option in the instance that [facility] is full and pt wife states that second choice would be [facility].     [name] contacted [employer], Erling Conte and provided referral. [name] was notified from [facility], Erling Conte that [facility] is full at this time.    [name] contacted [facility] [place] liaison, Orbie Pyo and was notified that [facility] [place], Nunzio Cory will complete assessment.    [name] updated pt wife and pt family at bedside in regard to [facility] being full and awaiting evaluation from [facility] [place].    Pt wife expressed understanding and agreeable to [facility] if facility can accept.    [name] to await response from [facility] [place].    [name] to continue to follow to assist with pt transition to residential hospice.   Assessment/plan status:  Psychosocial Support/Ongoing Assessment of Needs Other assessment/ plan:   discharge planning   Information/referral to community resources:   Residential Hospice List    PATIENT'S/FAMILY'S RESPONSE TO PLAN OF CARE: Pt lethargic and pt wife reports that pt no longer eating or drinking. Pt wife and pt family appropriately emotional and hopeful that pt can remain comfortable. Pt wife appreciative of [name] visit and support surrounding pt decline and residential hospice placement.    [name], MSW, LCSW Clinical Social Work Coverage for [place], [place] [phone]

## 2014-08-08 NOTE — Progress Notes (Signed)
Increased morphine to 4mg /h at 0200 after three 1mg  boluses.. Continue to monitior

## 2014-08-08 NOTE — Progress Notes (Signed)
Clinical Social Work  Patient accepted to [facility] home and they can accept him on 8/14 after pump is changed. Hospice to get patient to sign consent forms today. [name] will fax DC summary to [phone] once completed. [name] made MD aware of DC plans for 8/14.  [place], [place] [phone]

## 2014-08-09 DIAGNOSIS — K1370 Unspecified lesions of oral mucosa: Secondary | ICD-10-CM

## 2014-08-09 MED ORDER — FUROSEMIDE 10 MG/ML IJ SOLN
20.0000 mg | Freq: Once | INTRAMUSCULAR | Status: AC
  Administered 2014-08-09: 20 mg via INTRAVENOUS
  Filled 2014-08-09: qty 2

## 2014-08-09 MED ORDER — FUROSEMIDE 10 MG/ML IJ SOLN
40.0000 mg | Freq: Once | INTRAMUSCULAR | Status: AC
  Administered 2014-08-09: 40 mg via INTRAVENOUS
  Filled 2014-08-09: qty 4

## 2014-08-09 NOTE — Progress Notes (Signed)
Patient having agonal breathing and coarse crackles.  Dr. Grandville Silos notified of change in patient's status.  Durwin Nora RN

## 2014-08-09 NOTE — Progress Notes (Signed)
[facility] PROGRESS NOTE  Nicholas Soto MGQ:[id number] DOB: [birth year] DOA: 08/23/2014 PCP: [name], MD  Assessment/Plan: #1. Intractable cancer related pain/fatigue/weakness in setting of widespread bony metastases of unknown primary Patient completed radiation therapy, but deteriorated. Patient declining, and lethargic Continue decadron. Continue pain management with morphine drip. Palliative care helping with pain management. Patient with agonal breaths and anticipate a hospital death. Continue full comfort measures.  #2 AOCD H/H stable. Follow.  #3 COPD Stable. Continue BD, oxygen, PRN.  #4 Depression/ANxiety Stable.  #5 Metastatic Adenocarinoma of unknown primary Patient with FTT and poor functional status and not a candidate for agressive chemotherapy per oncology. Oncology in agreement with hospice referral and palliative care. Palliative care following and I appreciate input and recommendations.Patient currently with agonal breathing and likely will have a hospital death. Will hold off on transfer to hospice home for now. Continue comfort measures.  #6 failure to thrive/severe protein calorie malnutrition Patient full comfort. Palliative care managing patient's pain.  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  #8 thrombocytopenia Stable.  #9 prophylaxis PPI for GI prophylaxis. Heparin for DVT prophylaxis.  Code Status: DNR Family Communication: updated patient and wife and family at bedside. Disposition Plan: Anticipate hospital death.   Consultants:  Oncology: Dr. [name] 08/07/2014  Palliative care Wadie Lessen, NP 08/06/2014  Procedures:  PICC line 08/07/2014>>>> 08/07/2014  Chest x-ray 08/21/2014, 08/07/2014  Antibiotics:  None  HPI/Subjective: Patient lethargic, agonal breathing. Family at bedside.  Objective: Filed Vitals:   08/09/14 0634  BP: 147/86  Pulse: 119  Temp: 100.4 F (38 C)  Resp: 22     Intake/Output Summary (Last 24 hours) at 08/09/14 1322 Last data filed at 08/09/14 0630  Gross per 24 hour  Intake    720 ml  Output   1025 ml  Net   -305 ml   Filed Weights   08/08/14 0900  Weight: 75.615 kg (166 lb 11.2 oz)    Exam:   General:  Agonal breaths  Cardiovascular: RRR  Respiratory: Agonal breathing.  Abdomen: Soft, nontender, nondistended, positive bowel sounds  Musculoskeletal: No clubbing cyanosis or edema   Data Reviewed: Basic Metabolic Panel:  Recent Labs Lab 08/09/2014 1615 08/06/14 0525 08/07/14 0530 08/08/14 0526  NA 131* 131* 133* 130*  K 4.6 4.1 3.9 4.0  CL 91* 94* 96 93*  CO2 28 28 28  32  GLUCOSE 91 124* 120* 111*  BUN 24* 19 13 13   CREATININE 0.46* 0.52 0.48* 0.48*  CALCIUM 8.2* 7.8* 7.9* 8.1*  MG 2.1  --   --   --   PHOS 5.1*  --   --   --    Liver Function Tests:  Recent Labs Lab 08/06/14 0525  AST 24  ALT 14  ALKPHOS 80  BILITOT 0.4  PROT 4.9*  ALBUMIN 2.0*   No results found for this basename: LIPASE, AMYLASE,  in the last 168 hours No results found for this basename: AMMONIA,  in the last 168 hours CBC:  Recent Labs Lab 07/31/2014 1615 08/06/14 0525 08/07/14 0530  WBC 6.3 5.5 5.2  NEUTROABS 5.8  --   --   HGB 11.4* 10.1* 10.1*  HCT 33.4* 29.4* 29.1*  MCV 87.0 87.5 86.9  PLT 127* 100* 105*   Cardiac Enzymes: No results found for this basename: CKTOTAL, CKMB, CKMBINDEX, TROPONINI,  in the last 168 hours BNP (last 3 results) No results found for this basename: PROBNP,  in the last 8760 hours  CBG: No results found for this basename: GLUCAP,  in the last 168 hours  No results found for this or any previous visit (from the past 240 hour(s)).   Studies: Dg Chest Port 1 View  08/07/2014   CLINICAL DATA:  PICC line placement. Metastatic carcinoma of left orbit.  EXAM: PORTABLE CHEST - 1 VIEW  COMPARISON:  08/06/2014  FINDINGS: Right-sided PICC line extends superiorly in the right side of the neck, above the  superior aspect of the film.  Normal heart size. Right paratracheal mass/adenopathy, as detailed previously. Hyperinflation. No pleural effusion or pneumothorax. chronic interstitial thickening. Favor volume loss in the right suprahilar region.  IMPRESSION: Right-sided PICC line extending superiorly into the right-sided of the neck. Critical test results telephoned to . Dr. Grandville Silos. at the time of interpretation at 1:51 p.m.on 08/07/2014.  COPD/chronic bronchitis with right paratracheal mass/adenopathy.  Favor volume loss and atelectasis in the right suprahilar region. Early infection or postobstructive pneumonitis could look similar.   Electronically Signed   By: Abigail Miyamoto M.D.   On: 08/07/2014 13:52    Scheduled Meds: . antiseptic oral rinse  7 mL Mouth Rinse BID  . budesonide-formoterol  2 puff Inhalation QHS  . dexamethasone  4 mg Oral 4 times per day  . feeding supplement (ENSURE COMPLETE)  237 mL Oral TID BM  . nicotine  21 mg Transdermal Daily  . scopolamine  1 patch Transdermal Q72H  . senna-docusate  1 tablet Oral QHS  . sodium chloride  3 mL Intravenous Q12H  . tiotropium  18 mcg Inhalation Daily   Continuous Infusions: . dextrose 5 % and 0.9% NaCl 30 mL/hr at 08/06/14 1201  . morphine 7 mg/hr (08/09/14 1117)    Principal Problem:   Intractable pain Active Problems:   Pain, cancer   TOBACCO ABUSE   COPD exacerbation   Bone metastasis   Severe protein-calorie malnutrition   Dehydration   Failure to thrive in adult   Palliative care encounter    Time spent: 50 mins    [facility] MD [facility] Pager [phone]. If 7PM-7AM, please contact night-coverage at [email or web address], password [facility] 08/09/2014, 1:22 PM  LOS: 4 days

## 2014-08-09 NOTE — Progress Notes (Signed)
Ongoing grief support with spouse, children and extended family at bedside.  Referral from palliative and night chaplain  - pt actively dying.

## 2014-08-09 NOTE — Progress Notes (Signed)
Progress Note from the Palliative Medicine Team at [facility]:   -patient continues to decline, unresponsive to gentle touch and verbal stimuli, wife at bedside  -discussed with wife that patient is actively dying and prognosis is likely hrs, trajectory and expectations at EOL detailed  -wife to contact family to gather, comfort cart ordered and chaplain consulted  Objective: Allergies  Allergen Reactions  . Meperidine Hcl Nausea And Vomiting   Scheduled Meds: . antiseptic oral rinse  7 mL Mouth Rinse BID  . budesonide-formoterol  2 puff Inhalation QHS  . dexamethasone  4 mg Oral 4 times per day  . feeding supplement (ENSURE COMPLETE)  237 mL Oral TID BM  . nicotine  21 mg Transdermal Daily  . scopolamine  1 patch Transdermal Q72H  . senna-docusate  1 tablet Oral QHS  . sodium chloride  3 mL Intravenous Q12H  . tiotropium  18 mcg Inhalation Daily   Continuous Infusions: . dextrose 5 % and 0.9% NaCl 30 mL/hr at 08/06/14 1201  . morphine 4 mg/hr (08/09/14 0820)   PRN Meds:.sodium chloride, albuterol, bisacodyl, LORazepam, morphine, nitroGLYCERIN, ondansetron, polyethylene glycol, promethazine, sodium chloride, sodium chloride  BP 147/86  Pulse 119  Temp(Src) 100.4 F (38 C) (Axillary)  Resp 22  Ht 5\' 9"  (1.753 m)  Wt 75.615 kg (166 lb 11.2 oz)  BMI 24.61 kg/m2  SpO2 97%   PPS: 30%  Pain Score: 4/10 Pain Location back   Intake/Output Summary (Last 24 hours) at 08/09/14 1035 Last data filed at 08/09/14 0630  Gross per 24 hour  Intake    720 ml  Output   1025 ml  Net   -305 ml        Physical Exam:  General: appears to be transitioning at EOL,  HEENT: dry buccal membranes,  audible throat secretions  Chest: Decreased in bases, scattered course BS CVS: tachycardic  Abdomen: soft NT +BS  Ext: without edema  Neuro:  Unresponsive to gentle touch    Labs: CBC    Component Value Date/Time   WBC 5.2 08/07/2014 0530   RBC 3.35* 08/07/2014 0530   HGB 10.1* 08/07/2014 0530   HCT 29.1* 08/07/2014 0530   PLT 105* 08/07/2014 0530   MCV 86.9 08/07/2014 0530   MCH 30.1 08/07/2014 0530   MCHC 34.7 08/07/2014 0530   RDW 14.4 08/07/2014 0530   LYMPHSABS 0.2* 08/09/2014 1615   MONOABS 0.3 07/28/2014 1615   EOSABS 0.0 08/13/2014 1615   BASOSABS 0.0 08/14/2014 1615    BMET    Component Value Date/Time   NA 130* 08/08/2014 0526   K 4.0 08/08/2014 0526   CL 93* 08/08/2014 0526   CO2 32 08/08/2014 0526   GLUCOSE 111* 08/08/2014 0526   BUN 13 08/08/2014 0526   CREATININE 0.48* 08/08/2014 0526   CALCIUM 8.1* 08/08/2014 0526   GFRNONAA >90 08/08/2014 0526   GFRAA >90 08/08/2014 0526    CMP     Component Value Date/Time   NA 130* 08/08/2014 0526   K 4.0 08/08/2014 0526   CL 93* 08/08/2014 0526   CO2 32 08/08/2014 0526   GLUCOSE 111* 08/08/2014 0526   BUN 13 08/08/2014 0526   CREATININE 0.48* 08/08/2014 0526   CALCIUM 8.1* 08/08/2014 0526   PROT 4.9* 08/06/2014 0525   ALBUMIN 2.0* 08/06/2014 0525   AST 24 08/06/2014 0525   ALT 14 08/06/2014 0525   ALKPHOS 80 08/06/2014 0525   BILITOT 0.4 08/06/2014 0525   GFRNONAA >90 08/08/2014  0526   GFRAA >90 08/08/2014 0526      Assessment and Plan: 1. Code Status: DNR/DNI-comfort is main focus pf care 2. Symptom Control: -Pain/Dyspnea: Continue to titrate morphine gtt as needed for symptom management and to enhance comfort  (Morphine IV  Gtt now  at 5/ml hr, utilize 1/mg bolus every 15 minutes, if need of three or more boluses in one hr titrate gtt up by 1mg /hr) -Anxiety: Convert Ativan 2 mg po to IV every 6 hrs prn Terminal secretions: Scopolamine patch as directed  3. Psycho/Social: Emotional support offered  his wife.  Attempted to prepare her for impeding death 4. Spiritual: Chaplain contacted  4.    Disposition:  Recommend that patient remain in the hospital, prognosis is likely hrs   Patient Documents Completed or Given: Document Given Completed  Advanced Directives Pkt    MOST    DNR    Gone from My  [facility]    Hard Choices yes     Time In Time Out Total Time Spent with Patient Total Overall Time  0730 0805 35 mim 35 min    Greater than 50%  of this time was spent counseling and coordinating care related to the above assessment and plan.  Wadie Lessen NP  Palliative Medicine Team Team Phone # [phone] Pager [phone]  Discussed with Dr Grandville Silos

## 2014-08-09 NOTE — Progress Notes (Signed)
Page to chaplain at [phone], 09 August 2014  Mr Stanforth is in the last stages of dying and is expected to die with a short period of time.  Night chaplain responded to transition page. Provided comfort and prayer to Margart Sickles, the wife of Mr Bergen the patient. Chaplain relieved by daytime chaplain.  Sallee Lange. Reagen Haberman, [place]

## 2014-08-09 NOTE — Progress Notes (Signed)
Clinical Social Work  Patient was discussed during progression meeting and MD does not feel that patient is medically stable to transfer to hospice. MD anticipates hospital death. [name] updated Beverlee Nims at [facility] that patient will not transfer today. [name] will continue to follow.  [place], [place] [phone]

## 2014-08-12 DIAGNOSIS — K117 Disturbances of salivary secretion: Secondary | ICD-10-CM

## 2014-08-14 NOTE — Consult Note (Signed)
I have reviewed and discussed case with Nurse Practitioner And agree with documentation and plan as noted above   Derry Kassel J. Laquan Beier D.O.  Palliative Medicine Team at [place]  Team Phone: [phone]    

## 2014-08-20 DIAGNOSIS — C799 Secondary malignant neoplasm of unspecified site: Secondary | ICD-10-CM | POA: Diagnosis present

## 2014-08-27 NOTE — Progress Notes (Signed)
Time of death 0130 family at bedside MD aware CDS notified Bed placement notified

## 2014-08-27 NOTE — Discharge Summary (Signed)
Death Summary  Nicholas Soto IOM:[id number] DOB: 1951/07/24 DOA: 08-25-14  PCP: [name], MD PCP/Office notified:   Admit date: 08-25-2014 Date of Death: 08/30/2014  Final Diagnoses:  Principal Problem:   Intractable pain Active Problems:   Metastatic adenocarcinoma of unknown origin   Pain, cancer   TOBACCO ABUSE   COPD exacerbation   Bone metastasis   Severe protein-calorie malnutrition   Dehydration   Failure to thrive in adult   Palliative care encounter   Increased oropharyngeal secretions    History of present illness:  Nicholas Soto is a 63 y.o. 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 Much of the history was obtained from family at bedside as patient is somnolent. Patient followed by Dr. [name]. Presented to the hospital with the above complaints. Reportedly patient went to a rehab facility but checked himself out and once he went home he got progressively weaker and his oral intake became poor. Family reports he was unable to take his home pain regimen. He was evaluated by home health nurse who recommended he present to the hospital for further evaluation recommendations given soft blood pressures.  We were consulted for further medical evaluation recommendations regarding failure to thrive   Hospital Course:  #1. Intractable cancer related pain/fatigue/weakness in setting of widespread bony metastasic adenocarcinoma of unknown primary  Patient was admitted for management of intractable pain in the setting of widespread bony metastatic adenocarcinoma of unknown primary. Patient had completed radiation therapy, but deteriorated. Patient was declining, and lethargic and subsequently placed on Decadron. Patient was seen in consultation by oncology who felt that given his failure to  thrive and poor functional status he was not a candidate for aggressive chemotherapy. Oncology was in agreement with palliative care and hospice referral as his life expectancy was deemed to be less than 6 months would be supportive therapy. Palliative care was consulted and managed the patient's pain. Patient was subsequently placed on a morphine drip. Patient deteriorated during the hospitalization and was kept comfortable. During the day of 08/09/2014, patient continued to decline with agonal breaths. Patient was subsequently pronounced dead at 0130 hours on the morning of 08/30/14. May his soul rest in peace. #2 AOCD  Remained stable. Follow.  #3 COPD  Remained stable. #4 Depression/ANxiety  Patient was maintained on ativan as needed.  #5 Metastatic Adenocarinoma of unknown primary  Patient with FTT and poor functional status and not a candidate for agressive chemotherapy per oncology. Oncology was in agreement with hospice referral and palliative care. Palliative care followed the patient throughout the hospitalization and patient was pain free and kept comfortable. Patient deteriorated and was pronounced dead at 0130 hours on 2014/08/30. #6 failure to thrive/severe protein calorie malnutrition  Patient was seen by palliative care and made comfortable with full comfort measures.    Time: 32 MINS  Signed:  THOMPSON,DANIEL MD [facility] 08/20/2014, 12:18 PM

## 2014-08-27 DEATH — deceased

## 2014-09-06 ENCOUNTER — Ambulatory Visit: Payer: 59 | Admitting: Radiation Oncology

## 2015-05-30 IMAGING — CT CT BIOPSY
1 series · 18 of 20 positions shown, 25 images · non-contrast
Comparison: none

CLINICAL DATA: Retroperitoneal mass. Previous biopsy positive.
Additional material needed for molecular profiling.

[Series 5: (hospital) 6.0 b30f · axial · 1.03mm/px · z∈[+1123,+1131]mm · 18 of 20 slices shown, 25 images]
[im 2/20  soft-tissue]
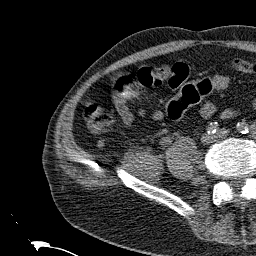
[im 2/20  bone]
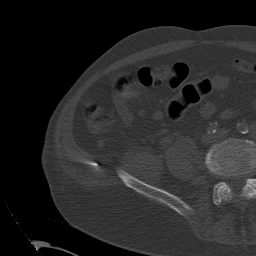
[im 3/20  soft-tissue]
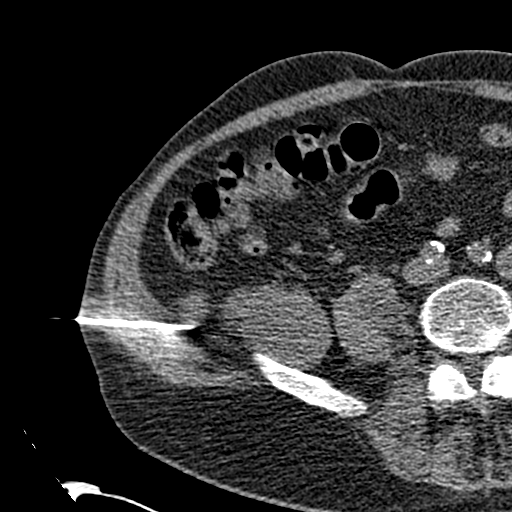
[im 4/20  soft-tissue]
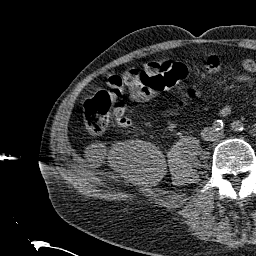
[im 5/20  soft-tissue]
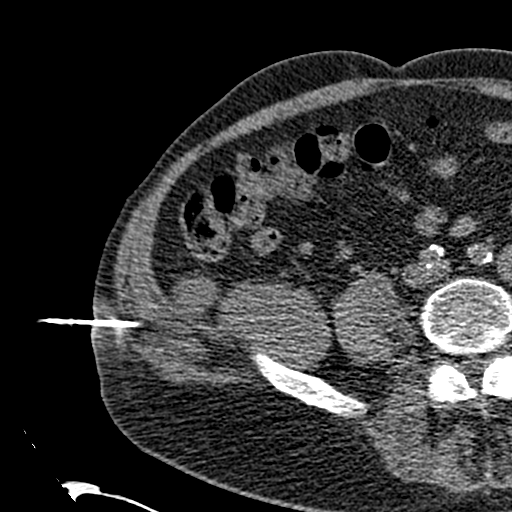
[im 6/20  soft-tissue]
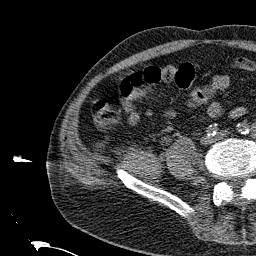
[im 7/20  soft-tissue]
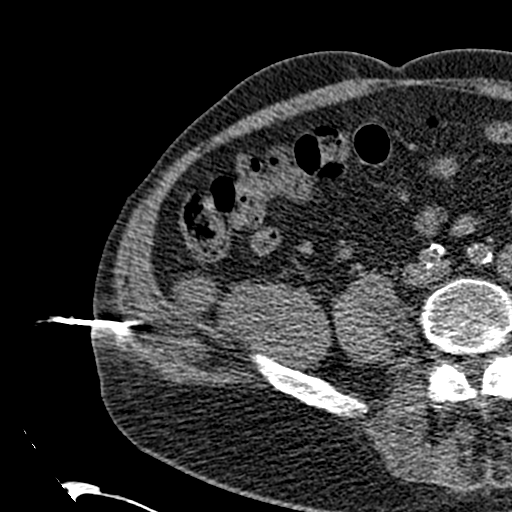
[im 8/20  soft-tissue]
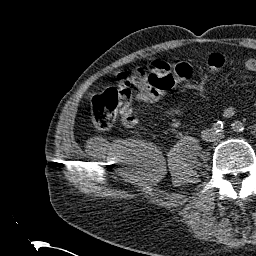
[im 9/20  soft-tissue]
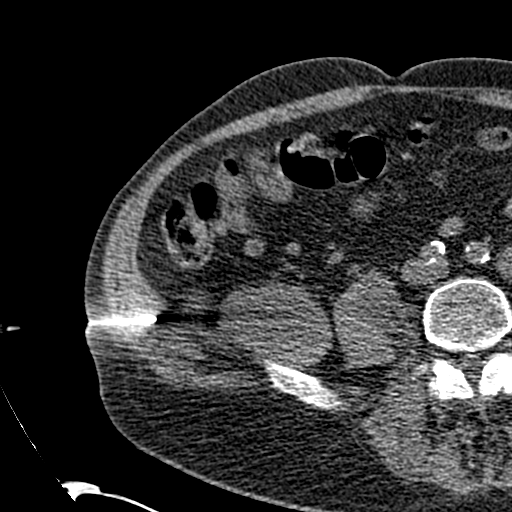
[im 10/20  soft-tissue]
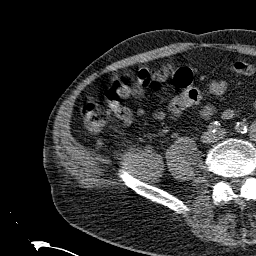
[im 10/20  bone]
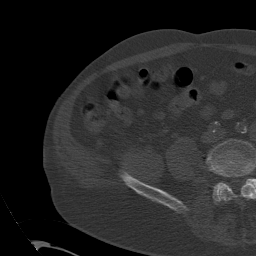
[im 11/20  soft-tissue]
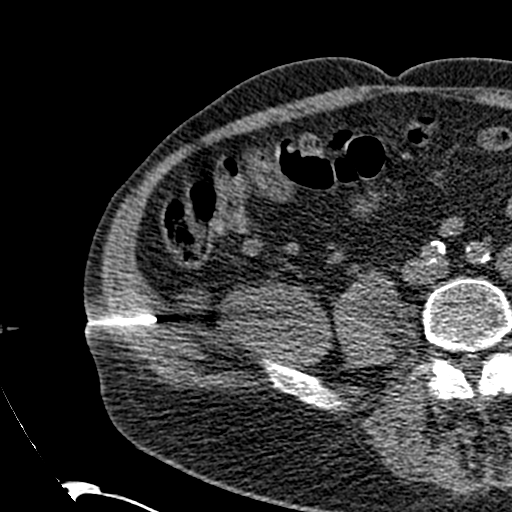
[im 12/20  soft-tissue]
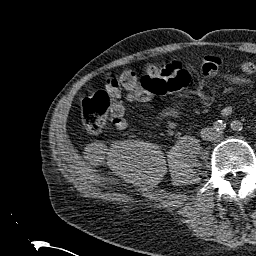
[im 13/20  soft-tissue]
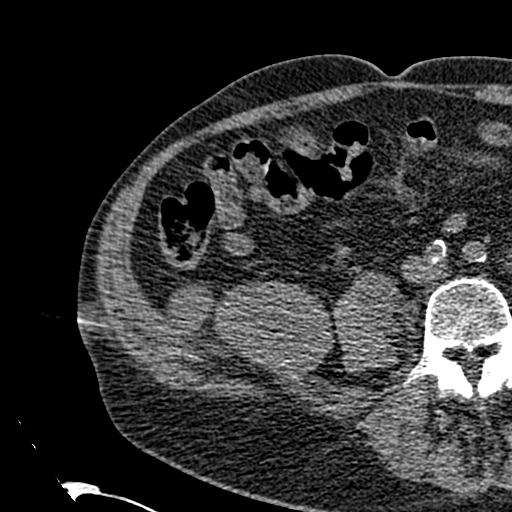
[im 13/20  lung]
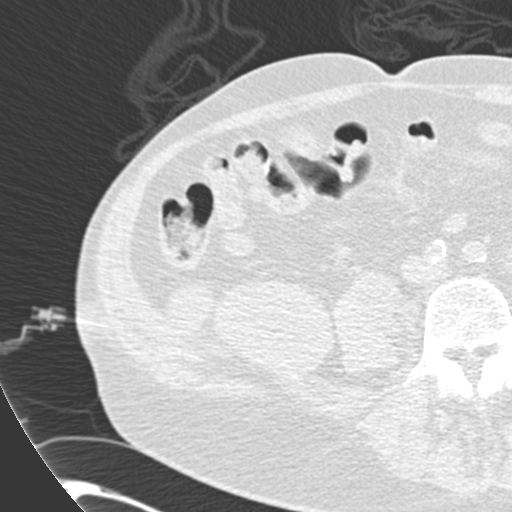
[im 14/20  soft-tissue]
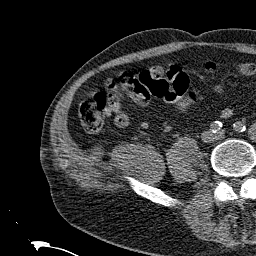
[im 15/20  soft-tissue]
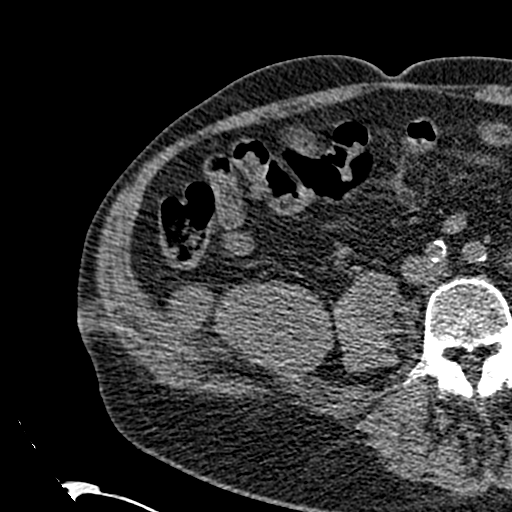
[im 16/20  soft-tissue]
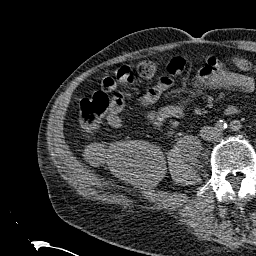
[im 17/20  soft-tissue]
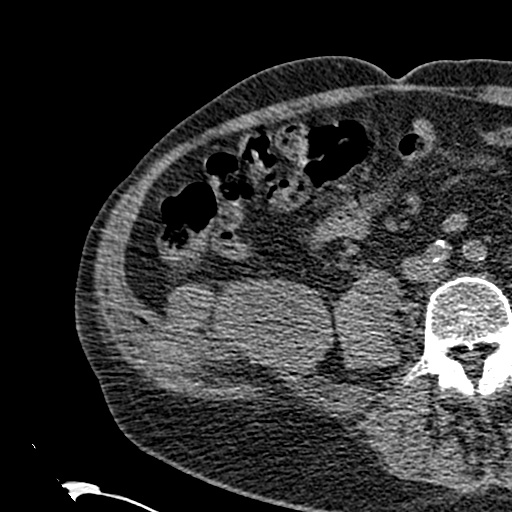
[im 17/20  lung]
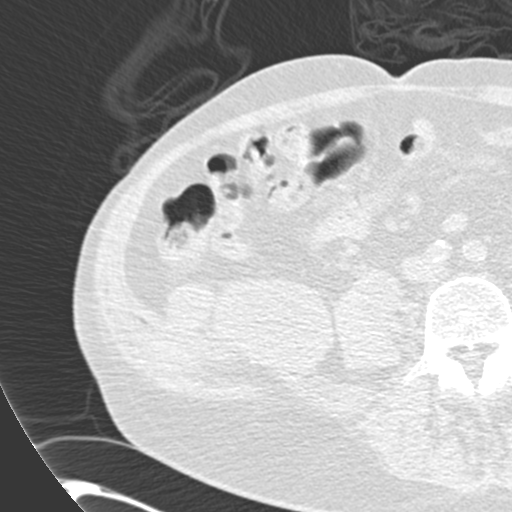
[im 18/20  soft-tissue]
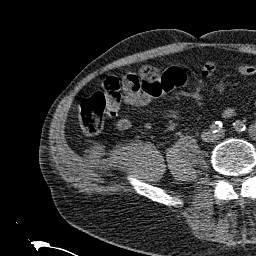
[im 18/20  lung]
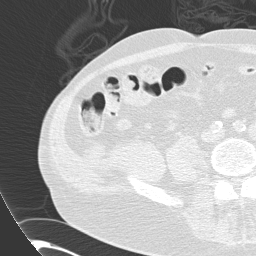
[im 18/20  bone]
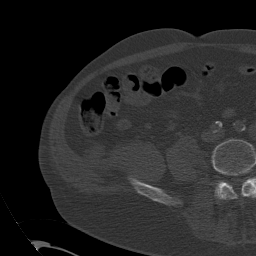
[im 19/20  soft-tissue]
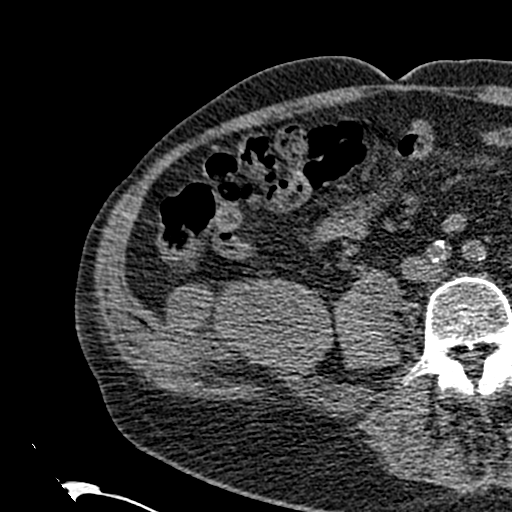
[im 19/20  lung]
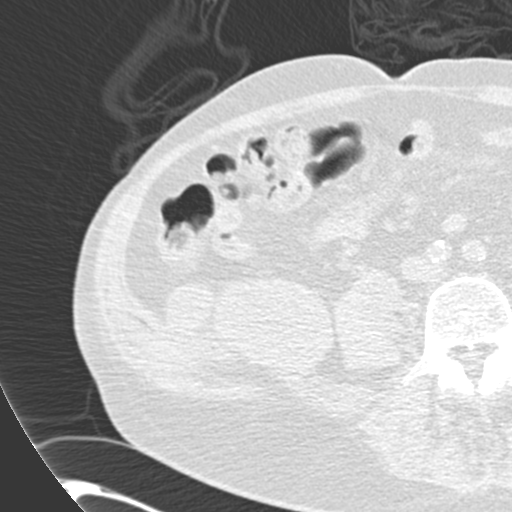

[18 of 20 positions shown; findings below may reference images not displayed]

EXAM:
CT GUIDED CORE BIOPSY OF RIGHT RETROPERITONEAL MASS

ANESTHESIA/SEDATION:
Intravenous Fentanyl and Versed were administered as conscious
sedation during continuous cardiorespiratory monitoring by the
radiology RN, with a total moderate sedation time of less than 30
minutes.

PROCEDURE:
The procedure risks, benefits, and alternatives were explained to
the patient. Questions regarding the procedure were encouraged and
answered. The patient understands and consents to the procedure.

Select axial scans through the mid abdomen were obtained, the
dominant right retroperitoneal mass was localized, and an
appropriate skin entry site was localized.

The operative field was prepped with Betadinein a sterile fashion,
and a sterile drape was applied covering the operative field. A
sterile gown and sterile gloves were used for the procedure. Local
anesthesia was provided with 1% Lidocaine.

Under CT fluoroscopic guidance, a 17 gauge trocar needle was
advanced to the margin of the lesion. Once needle tip position was
confirmed, coaxial 18-gauge core biopsy samples were obtained,
submitted in formalin to surgical pathology. The guide needle was
removed. Postprocedure scans show no hemorrhage or other apparent
complication. The patient tolerated the procedure well.

Complications: None immediate
FINDINGS: The dominant right retroperitoneal mass was again identified.
Percutaneous core biopsies were obtained under CT guidance.
IMPRESSION: 1. Technically successful CT-guided core biopsy of right
retroperitoneal mass.
